# Patient Record
Sex: Female | Born: 1970 | Race: Black or African American | Hispanic: No | Smoking: Never smoker
Health system: Southern US, Community
[De-identification: ages and names within clinical notes are randomized; demographics above are authoritative.]

## PROBLEM LIST (undated history)

## (undated) DIAGNOSIS — F319 Bipolar disorder, unspecified: Secondary | ICD-10-CM

## (undated) DIAGNOSIS — F329 Major depressive disorder, single episode, unspecified: Secondary | ICD-10-CM

## (undated) DIAGNOSIS — F32A Depression, unspecified: Secondary | ICD-10-CM

## (undated) DIAGNOSIS — F419 Anxiety disorder, unspecified: Secondary | ICD-10-CM

## (undated) HISTORY — DX: Bipolar disorder, unspecified: F31.9

## (undated) HISTORY — DX: Anxiety disorder, unspecified: F41.9

## (undated) HISTORY — DX: Major depressive disorder, single episode, unspecified: F32.9

## (undated) HISTORY — DX: Depression, unspecified: F32.A

---

## 2002-12-12 ENCOUNTER — Encounter: Payer: Self-pay | Admitting: Emergency Medicine

## 2002-12-12 ENCOUNTER — Emergency Department (HOSPITAL_COMMUNITY): Admission: EM | Admit: 2002-12-12 | Discharge: 2002-12-12 | Payer: Self-pay | Admitting: Emergency Medicine

## 2005-05-20 ENCOUNTER — Ambulatory Visit (HOSPITAL_COMMUNITY): Admission: RE | Admit: 2005-05-20 | Discharge: 2005-05-20 | Payer: Self-pay | Admitting: Obstetrics and Gynecology

## 2007-07-10 ENCOUNTER — Emergency Department (HOSPITAL_COMMUNITY): Admission: EM | Admit: 2007-07-10 | Discharge: 2007-07-10 | Payer: Self-pay | Admitting: Emergency Medicine

## 2011-12-04 ENCOUNTER — Encounter (HOSPITAL_COMMUNITY): Payer: Self-pay | Admitting: *Deleted

## 2011-12-04 ENCOUNTER — Emergency Department (INDEPENDENT_AMBULATORY_CARE_PROVIDER_SITE_OTHER)
Admission: EM | Admit: 2011-12-04 | Discharge: 2011-12-04 | Disposition: A | Discharge status: Home / Self Care | Payer: BC Managed Care – PPO | Source: Home / Self Care | Attending: Family Medicine | Admitting: Family Medicine

## 2011-12-04 DIAGNOSIS — R0789 Other chest pain: Secondary | ICD-10-CM

## 2011-12-04 MED ORDER — TRAMADOL HCL 50 MG PO TABS: 50.0000 mg | ORAL_TABLET | Freq: Four times a day (QID) | ORAL | Status: AC | PRN

## 2011-12-04 NOTE — ED Notes (Signed)
Pt  Reports   Chest  Wall pain   l  Side   Worse  When  She  Takes  A  Breath   Symptoms    X  4  Days   -  She  Is  Sitting  Comfortably  On  Exam table             Skin is  Warm  /  Dry   Cap  Refill is  brisk

## 2011-12-04 NOTE — ED Provider Notes (Signed)
History     CSN: [id number]  Arrival date & time 12/04/11  1745   First MD Initiated Contact with Patient 12/04/11 1803      Chief Complaint  Patient presents with  . Chest Pain    (Consider location/radiation/quality/duration/timing/severity/associated sxs/prior treatment) HPI Comments: 41 y/o female non smoker no significant PMH here c/o chest discomfort in left side of her chest below her breast constant for 4 days worse when takes a deep breath. No associated with diaphoresis or radiation. 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 Has not had this type of pain before. Has not seen her primary doctor for over 1 year. No abdominal pain, no acid reflux. No nausea, vomitting or abdominal pain. No rash. Pain is not associated with excerption. No cough, no leg swelling. Has not taken any medications for pain. Although initially patient reported pain was constant she later stated her pain was spontaneously resolved during my examination.    History reviewed. No pertinent past medical history.  History reviewed. No pertinent past surgical history.  Family History  Problem Relation Age of Onset  . Cancer Mother     History  Substance Use Topics  . Smoking status: Not on file  . Smokeless tobacco: Not on file  . Alcohol Use:     OB History    Grav Para Term Preterm Abortions TAB SAB Ect Mult Living                  Review of Systems  Constitutional: Positive for fatigue. Negative for fever, chills and diaphoresis.  Respiratory: Negative for cough, choking, chest tightness, shortness of breath and wheezing.   Cardiovascular: Positive for chest pain. Negative for palpitations and leg swelling.  Gastrointestinal: Negative for abdominal pain.  Skin: Negative for rash.  Neurological: Negative for dizziness.  Psychiatric/Behavioral:       Feels stressed about work hours    Allergies  Review  of patient's allergies indicates no known allergies.  Home Medications   Current Outpatient Rx  Name Route Sig Dispense Refill  . TRAMADOL HCL 50 MG PO TABS Oral Take 1 tablet (50 mg total) by mouth every 6 (six) hours as needed for pain. 15 tablet 0    BP 136/74  Pulse 72  Temp(Src) 98.6 F (37 C) (Oral)  Resp 20  SpO2 100%  LMP 11/20/2011  Physical Exam  Nursing note and vitals reviewed. Constitutional: She is oriented to person, place, and time. She appears well-developed and well-nourished. No distress.  HENT:  Head: Normocephalic and atraumatic.  Mouth/Throat: Oropharynx is clear and moist.  Eyes: EOM are normal. Pupils are equal, round, and reactive to light.  Neck: Neck supple. No JVD present. No thyromegaly present.  Cardiovascular: Normal rate, regular rhythm, normal heart sounds and intact distal pulses.  Exam reveals no gallop and no friction rub.   No murmur heard. Pulmonary/Chest: Effort normal and breath sounds normal. No respiratory distress. She has no wheezes. She has no rales. She exhibits no tenderness.  Abdominal: Soft. She exhibits no distension. There is no tenderness.  Musculoskeletal: She exhibits no edema.  Lymphadenopathy:    She has no cervical adenopathy.  Neurological: She is alert and oriented to person, place, and time.  Skin: No rash noted. She is not diaphoretic.    ED Course  Procedures (including critical care time)  Labs Reviewed - No data to  display No results found.   1. Atypical chest pain       MDM  Normal EKG: NSR, rate 74. No Q waves, no ST or ischemic changes. No repolarization problems. Normal left axis. Although pain has resolved, patient requested and got a note to be out of work tomorrow so she can see her primary doctor for follow up.           Sharin Grave, MD 12/06/11 [phone]

## 2013-01-31 ENCOUNTER — Emergency Department (HOSPITAL_COMMUNITY)
Admission: EM | Admit: 2013-01-31 | Discharge: 2013-02-01 | Disposition: A | Discharge status: Home / Self Care | Payer: BC Managed Care – PPO | Attending: Emergency Medicine | Admitting: Emergency Medicine

## 2013-01-31 ENCOUNTER — Emergency Department (HOSPITAL_COMMUNITY): Payer: BC Managed Care – PPO

## 2013-01-31 ENCOUNTER — Encounter (HOSPITAL_COMMUNITY): Payer: Self-pay | Admitting: *Deleted

## 2013-01-31 DIAGNOSIS — Y929 Unspecified place or not applicable: Secondary | ICD-10-CM | POA: Insufficient documentation

## 2013-01-31 DIAGNOSIS — X58XXXA Exposure to other specified factors, initial encounter: Secondary | ICD-10-CM | POA: Insufficient documentation

## 2013-01-31 DIAGNOSIS — Y939 Activity, unspecified: Secondary | ICD-10-CM | POA: Insufficient documentation

## 2013-01-31 DIAGNOSIS — L0390 Cellulitis, unspecified: Secondary | ICD-10-CM

## 2013-01-31 DIAGNOSIS — L02619 Cutaneous abscess of unspecified foot: Secondary | ICD-10-CM | POA: Insufficient documentation

## 2013-01-31 NOTE — ED Notes (Signed)
Pt states that her middle toe of her left foot is hurting and swelling. Pt does not remember stubbing her toe, does not remember injuring her toe,but states that it has continued to hurt for the last couple of days.

## 2013-02-01 MED ORDER — CEPHALEXIN 250 MG PO CAPS
500.0000 mg | ORAL_CAPSULE | Freq: Once | ORAL | Status: AC
  Administered 2013-02-01: 500 mg via ORAL
  Filled 2013-02-01: qty 2

## 2013-02-01 MED ORDER — IBUPROFEN 600 MG PO TABS: 600.0000 mg | ORAL_TABLET | Freq: Four times a day (QID) | ORAL | Status: DC | PRN

## 2013-02-01 MED ORDER — CEPHALEXIN 500 MG PO CAPS: 500.0000 mg | ORAL_CAPSULE | Freq: Four times a day (QID) | ORAL | Status: DC

## 2013-02-01 NOTE — ED Provider Notes (Signed)
History     CSN: [id number]  Arrival date & time 01/31/13  2200   First MD Initiated Contact with Patient 02/01/13 0133      Chief Complaint  Patient presents with  . Foot Injury    (Consider location/radiation/quality/duration/timing/severity/associated sxs/prior treatment) HPI Comments: For the past 3 days has had irritation to L middle toe dorsal aspect now with increased pain and swelling, slight discoloration denies fever, trauma, new shoes   Patient is a 42 y.o. female presenting with foot injury. The history is provided by the patient.  Foot Injury Location:  Foot Time since incident:  3 days Foot location:  L foot Pain details:    Quality:  Dull   Radiates to:  Does not radiate   Severity:  Mild   Onset quality:  Gradual   Duration:  3 days   Timing:  Constant   Progression:  Worsening Chronicity:  New Dislocation: no   Foreign body present:  No foreign bodies Tetanus status:  Unknown Prior injury to area:  Yes Relieved by:  Nothing Ineffective treatments:  None tried Associated symptoms: no fever     History reviewed. No pertinent past medical history.  History reviewed. No pertinent past surgical history.  Family History  Problem Relation Age of Onset  . Cancer Mother     History  Substance Use Topics  . Smoking status: Never Smoker   . Smokeless tobacco: Not on file  . Alcohol Use: No    OB History   Grav Para Term Preterm Abortions TAB SAB Ect Mult Living                  Review of Systems  Constitutional: Negative for fever and chills.  Musculoskeletal: Positive for joint swelling. Negative for myalgias and arthralgias.  Skin: Positive for color change and wound.  All other systems reviewed and are negative.    Allergies  Review of patient's allergies indicates no known allergies.  Home Medications   Current Outpatient Rx  Name  Route  Sig  Dispense  Refill  . Multiple Vitamin (MULTIVITAMIN WITH MINERALS) TABS   Oral   Take 1  tablet by mouth daily.         . cephALEXin (KEFLEX) 500 MG capsule   Oral   Take 1 capsule (500 mg total) by mouth 4 (four) times daily.   39 capsule   0   . ibuprofen (ADVIL,MOTRIN) 600 MG tablet   Oral   Take 1 tablet (600 mg total) by mouth every 6 (six) hours as needed for pain.   30 tablet   0     BP 138/69  Pulse 80  Temp(Src) 98.6 F (37 C) (Oral)  Resp 18  SpO2 100%  LMP 01/28/2013  Physical Exam  Vitals reviewed. Constitutional: She is oriented to person, place, and time. She appears well-developed and well-nourished.  HENT:  Head: Normocephalic and atraumatic.  Eyes: Pupils are equal, round, and reactive to light.  Neck: Normal range of motion.  Cardiovascular: Normal rate.   Pulmonary/Chest: Effort normal.  Musculoskeletal: She exhibits tenderness. She exhibits no edema.       Feet:  Small punctate dark area on dorsum of L middle toe with surrounding erythema, tenderness   Neurological: She is alert and oriented to person, place, and time.  Skin: Skin is warm. There is erythema.    ED Course  Procedures (including critical care time)  Labs Reviewed - No data to display Dg Foot Complete Left  02/01/2013  *RADIOLOGY REPORT*  Clinical Data: Pain and swelling at distal metatarsals for several days, no known injury  LEFT FOOT - COMPLETE 3+ VIEW  Comparison: None  Findings: Osseous mineralization appears mildly decreased. Bone island third metatarsal head. Joint spaces preserved. No acute fracture, dislocation, or bone destruction.  IMPRESSION: No acute osseous abnormalities.   Original Report Authenticated By: Ulyses Southward, M.D.      1. Cellulitis       MDM  Outlines erythema area started on Keflex to FU with PCP or return if getting worse         Arman Filter, NP 02/01/13 0304  Arman Filter, NP 02/01/13 9562

## 2013-02-01 NOTE — ED Provider Notes (Signed)
Medical screening examination/treatment/procedure(s) were performed by non-physician practitioner and as supervising physician I was immediately available for consultation/collaboration.  Taegan Haider, MD 02/01/13 0748 

## 2013-02-01 NOTE — ED Notes (Signed)
Pt has no swelling noted to her left foot. Pt has mobility of toes and cap refill less than 3. Pt states that when she wiggles her toes it hurts. Pt pain source is below her middle toe and second to last toe.

## 2013-03-18 ENCOUNTER — Emergency Department (INDEPENDENT_AMBULATORY_CARE_PROVIDER_SITE_OTHER)
Admission: EM | Admit: 2013-03-18 | Discharge: 2013-03-18 | Disposition: A | Discharge status: Home / Self Care | Payer: BC Managed Care – PPO | Source: Home / Self Care | Attending: Family Medicine | Admitting: Family Medicine

## 2013-03-18 ENCOUNTER — Encounter (HOSPITAL_COMMUNITY): Payer: Self-pay | Admitting: *Deleted

## 2013-03-18 DIAGNOSIS — J069 Acute upper respiratory infection, unspecified: Secondary | ICD-10-CM

## 2013-03-18 MED ORDER — AZITHROMYCIN 250 MG PO TABS: ORAL_TABLET | ORAL | Status: DC

## 2013-03-18 MED ORDER — FLUTICASONE PROPIONATE 50 MCG/ACT NA SUSP: 1.0000 | Freq: Two times a day (BID) | NASAL | Status: DC

## 2013-03-18 MED ORDER — FEXOFENADINE HCL 180 MG PO TABS: 180.0000 mg | ORAL_TABLET | Freq: Every day | ORAL | Status: DC

## 2013-03-18 NOTE — ED Provider Notes (Signed)
History     CSN: [id number]  Arrival date & time 03/18/13  1729   First MD Initiated Contact with Patient 03/18/13 1743      Chief Complaint  Patient presents with  . Cough    (Consider location/radiation/quality/duration/timing/severity/associated sxs/prior treatment) Patient is a 42 y.o. female presenting with cough. The history is provided by the patient.  Cough Cough characteristics:  Non-productive, dry and hoarse Severity:  Mild Onset quality:  Gradual Duration:  1 week Progression:  Unchanged Chronicity:  New Smoker: no   Associated symptoms: rhinorrhea and sore throat   Associated symptoms: no chills, no fever, no shortness of breath and no wheezing     History reviewed. No pertinent past medical history.  History reviewed. No pertinent past surgical history.  Family History  Problem Relation Age of Onset  . Cancer Mother     History  Substance Use Topics  . Smoking status: Never Smoker   . Smokeless tobacco: Not on file  . Alcohol Use: No    OB History   Grav Para Term Preterm Abortions TAB SAB Ect Mult Living                  Review of Systems  Constitutional: Negative.  Negative for fever and chills.  HENT: Positive for congestion, sore throat, rhinorrhea and postnasal drip.   Respiratory: Positive for cough. Negative for shortness of breath and wheezing.   Gastrointestinal: Negative.     Allergies  Review of patient's allergies indicates no known allergies.  Home Medications   Current Outpatient Rx  Name  Route  Sig  Dispense  Refill  . azithromycin (ZITHROMAX Z-PAK) 250 MG tablet      Take as directed on pack   6 each   0   . cephALEXin (KEFLEX) 500 MG capsule   Oral   Take 1 capsule (500 mg total) by mouth 4 (four) times daily.   39 capsule   0   . fexofenadine (ALLEGRA) 180 MG tablet   Oral   Take 1 tablet (180 mg total) by mouth daily.   30 tablet   1   . fluticasone (FLONASE) 50 MCG/ACT nasal spray   Nasal   Place 1  spray into the nose 2 (two) times daily.   1 g   2   . ibuprofen (ADVIL,MOTRIN) 600 MG tablet   Oral   Take 1 tablet (600 mg total) by mouth every 6 (six) hours as needed for pain.   30 tablet   0   . Multiple Vitamin (MULTIVITAMIN WITH MINERALS) TABS   Oral   Take 1 tablet by mouth daily.           BP 153/87  Pulse 88  Temp(Src) 99.1 F (37.3 C) (Oral)  Resp 20  SpO2 100%  LMP 03/18/2013  Physical Exam  Nursing note and vitals reviewed. Constitutional: She is oriented to person, place, and time. She appears well-developed and well-nourished.  HENT:  Head: Normocephalic.  Right Ear: External ear normal.  Left Ear: External ear normal.  Nose: Mucosal edema and rhinorrhea present.  Mouth/Throat: Oropharynx is clear and moist.  Neck: Normal range of motion. Neck supple.  Cardiovascular: Regular rhythm.   Pulmonary/Chest: Effort normal and breath sounds normal.  Lymphadenopathy:    She has no cervical adenopathy.  Neurological: She is alert and oriented to person, place, and time.  Skin: Skin is warm and dry.    ED Course  Procedures (including critical care time)  Labs Reviewed - No data to display No results found.   1. URI (upper respiratory infection)       MDM          Linna Hoff, MD 03/18/13 [phone]

## 2013-03-18 NOTE — ED Notes (Signed)
Pt  Reports  Symptoms  Of  A   Dry  Cough    sorethroat  Hoarse        X   1  Week   She  Also  Reports       Decreased        Appetite          And  A  Headache  Last  Pm

## 2013-12-17 ENCOUNTER — Emergency Department (HOSPITAL_COMMUNITY)
Admission: EM | Admit: 2013-12-17 | Discharge: 2013-12-17 | Disposition: A | Discharge status: Home / Self Care | Payer: BC Managed Care – PPO | Source: Home / Self Care | Attending: Family Medicine | Admitting: Family Medicine

## 2013-12-17 ENCOUNTER — Encounter (HOSPITAL_COMMUNITY): Payer: Self-pay | Admitting: Emergency Medicine

## 2013-12-17 DIAGNOSIS — R05 Cough: Secondary | ICD-10-CM

## 2013-12-17 DIAGNOSIS — R059 Cough, unspecified: Secondary | ICD-10-CM

## 2013-12-17 DIAGNOSIS — J00 Acute nasopharyngitis [common cold]: Secondary | ICD-10-CM

## 2013-12-17 MED ORDER — HYDROCOD POLST-CHLORPHEN POLST 10-8 MG/5ML PO LQCR: 5.0000 mL | Freq: Two times a day (BID) | ORAL | Status: DC | PRN

## 2013-12-17 MED ORDER — AEROCHAMBER PLUS FLO-VU LARGE MISC: 1.0000 | Freq: Once | Status: DC

## 2013-12-17 MED ORDER — ALBUTEROL SULFATE HFA 108 (90 BASE) MCG/ACT IN AERS: 1.0000 | INHALATION_SPRAY | Freq: Four times a day (QID) | RESPIRATORY_TRACT | Status: DC | PRN

## 2013-12-17 MED ORDER — METHYLPREDNISOLONE (PAK) 4 MG PO TABS: ORAL_TABLET | ORAL | Status: DC

## 2013-12-17 NOTE — ED Provider Notes (Signed)
Medical screening examination/treatment/procedure(s) were performed by resident physician or non-physician practitioner and as supervising physician I was immediately available for consultation/collaboration.   KINDL,JAMES DOUGLAS MD.   James D Kindl, MD 12/17/13 1315 

## 2013-12-17 NOTE — Discharge Instructions (Signed)
Antibiotic Nonuse ° Your caregiver felt that the infection or problem was not one that would be helped with an antibiotic. °Infections may be caused by viruses or bacteria. Only a caregiver can tell which one of these is the likely cause of an illness. A cold is the most common cause of infection in both adults and children. A cold is a virus. Antibiotic treatment will have no effect on a viral infection. Viruses can lead to many lost days of work caring for sick children and many missed days of school. Children may catch as many as 10 "colds" or "flus" per year during which they can be tearful, cranky, and uncomfortable. The goal of treating a virus is aimed at keeping the ill person comfortable. °Antibiotics are medications used to help the body fight bacterial infections. There are relatively few types of bacteria that cause infections but there are hundreds of viruses. While both viruses and bacteria cause infection they are very different types of germs. A viral infection will typically go away by itself within 7 to 10 days. Bacterial infections may spread or get worse without antibiotic treatment. °Examples of bacterial infections are: °· Sore throats (like strep throat or tonsillitis). °· Infection in the lung (pneumonia). °· Ear and skin infections. °Examples of viral infections are: °· Colds or flus. °· Most coughs and bronchitis. °· Sore throats not caused by Strep. °· Runny noses. °It is often best not to take an antibiotic when a viral infection is the cause of the problem. Antibiotics can kill off the helpful bacteria that we have inside our body and allow harmful bacteria to start growing. Antibiotics can cause side effects such as allergies, nausea, and diarrhea without helping to improve the symptoms of the viral infection. Additionally, repeated uses of antibiotics can cause bacteria inside of our body to become resistant. That resistance can be passed onto harmful bacterial. The next time you have  an infection it may be harder to treat if antibiotics are used when they are not needed. Not treating with antibiotics allows our own immune system to develop and take care of infections more efficiently. Also, antibiotics will work better for us when they are prescribed for bacterial infections. °Treatments for a child that is ill may include: °· Give extra fluids throughout the day to stay hydrated. °· Get plenty of rest. °· Only give your child over-the-counter or prescription medicines for pain, discomfort, or fever as directed by your caregiver. °· The use of a cool mist humidifier may help stuffy noses. °· Cold medications if suggested by your caregiver. °Your caregiver may decide to start you on an antibiotic if: °· The problem you were seen for today continues for a longer length of time than expected. °· You develop a secondary bacterial infection. °SEEK MEDICAL CARE IF: °· Fever lasts longer than 5 days. °· Symptoms continue to get worse after 5 to 7 days or become severe. °· Difficulty in breathing develops. °· Signs of dehydration develop (poor drinking, rare urinating, dark colored urine). °· Changes in behavior or worsening tiredness (listlessness or lethargy). °Document Released: 01/20/2002 Document Revised: 02/03/2012 Document Reviewed: 07/19/2009 °[employer]® Patient Information ©2014 [employer]. ° °Upper Respiratory Infection, Adult °An upper respiratory infection (URI) is also sometimes known as the common cold. The upper respiratory tract includes the nose, sinuses, throat, trachea, and bronchi. Bronchi are the airways leading to the lungs. Most people improve within 1 week, but symptoms can last up to 2 weeks. A residual cough may   last even longer.  °CAUSES °Many different viruses can infect the tissues lining the upper respiratory tract. The tissues become irritated and inflamed and often become very moist. Mucus production is also common. A cold is contagious. You can easily spread the virus  to others by oral contact. This includes kissing, sharing a glass, coughing, or sneezing. Touching your mouth or nose and then touching a surface, which is then touched by another person, can also spread the virus. °SYMPTOMS  °Symptoms typically develop 1 to 3 days after you come in contact with a cold virus. Symptoms vary from person to person. They may include: °· Runny nose. °· Sneezing. °· Nasal congestion. °· Sinus irritation. °· Sore throat. °· Loss of voice (laryngitis). °· Cough. °· Fatigue. °· Muscle aches. °· Loss of appetite. °· Headache. °· Low-grade fever. °DIAGNOSIS  °You might diagnose your own cold based on familiar symptoms, since most people get a cold 2 to 3 times a year. Your caregiver can confirm this based on your exam. Most importantly, your caregiver can check that your symptoms are not due to another disease such as strep throat, sinusitis, pneumonia, asthma, or epiglottitis. Blood tests, throat tests, and X-rays are not necessary to diagnose a common cold, but they may sometimes be helpful in excluding other more serious diseases. Your caregiver will decide if any further tests are required. °RISKS AND COMPLICATIONS  °You may be at risk for a more severe case of the common cold if you smoke cigarettes, have chronic heart disease (such as heart failure) or lung disease (such as asthma), or if you have a weakened immune system. The very young and very old are also at risk for more serious infections. Bacterial sinusitis, middle ear infections, and bacterial pneumonia can complicate the common cold. The common cold can worsen asthma and chronic obstructive pulmonary disease (COPD). Sometimes, these complications can require emergency medical care and may be life-threatening. °PREVENTION  °The best way to protect against getting a cold is to practice good hygiene. Avoid oral or hand contact with people with cold symptoms. Wash your hands often if contact occurs. There is no clear evidence that  vitamin C, vitamin E, echinacea, or exercise reduces the chance of developing a cold. However, it is always recommended to get plenty of rest and practice good nutrition. °TREATMENT  °Treatment is directed at relieving symptoms. There is no cure. Antibiotics are not effective, because the infection is caused by a virus, not by bacteria. Treatment may include: °· Increased fluid intake. Sports drinks offer valuable electrolytes, sugars, and fluids. °· Breathing heated mist or steam (vaporizer or shower). °· Eating chicken soup or other clear broths, and maintaining good nutrition. °· Getting plenty of rest. °· Using gargles or lozenges for comfort. °· Controlling fevers with ibuprofen or acetaminophen as directed by your caregiver. °· Increasing usage of your inhaler if you have asthma. °Zinc gel and zinc lozenges, taken in the first 24 hours of the common cold, can shorten the duration and lessen the severity of symptoms. Pain medicines may help with fever, muscle aches, and throat pain. A variety of non-prescription medicines are available to treat congestion and runny nose. Your caregiver can make recommendations and may suggest nasal or lung inhalers for other symptoms.  °HOME CARE INSTRUCTIONS  °· Only take over-the-counter or prescription medicines for pain, discomfort, or fever as directed by your caregiver. °· Use a warm mist humidifier or inhale steam from a shower to increase air moisture. This may keep   secretions moist and make it easier to breathe. °· Drink enough water and fluids to keep your urine clear or pale yellow. °· Rest as needed. °· Return to work when your temperature has returned to normal or as your caregiver advises. You may need to stay home longer to avoid infecting others. You can also use a face mask and careful hand washing to prevent spread of the virus. °SEEK MEDICAL CARE IF:  °· After the first few days, you feel you are getting worse rather than better. °· You need your caregiver's  advice about medicines to control symptoms. °· You develop chills, worsening shortness of breath, or brown or red sputum. These may be signs of pneumonia. °· You develop yellow or brown nasal discharge or pain in the face, especially when you bend forward. These may be signs of sinusitis. °· You develop a fever, swollen neck glands, pain with swallowing, or white areas in the back of your throat. These may be signs of strep throat. °SEEK IMMEDIATE MEDICAL CARE IF:  °· You have a fever. °· You develop severe or persistent headache, ear pain, sinus pain, or chest pain. °· You develop wheezing, a prolonged cough, cough up blood, or have a change in your usual mucus (if you have chronic lung disease). °· You develop sore muscles or a stiff neck. °Document Released: 05/07/2001 Document Revised: 02/03/2012 Document Reviewed: 03/15/2011 °Exit[employer]® Patient Information ©2014 [employer]. ° °

## 2013-12-17 NOTE — ED Notes (Signed)
C/o  Non productive cough.  Chills.  Low grade temp.  Diarrhea.  Chest congestion.    States "just feel lousy"  Symptoms present since Monday.  No relief with otc meds.

## 2013-12-17 NOTE — ED Provider Notes (Signed)
CSN: [id number]     Arrival date & time 12/17/13  [id number] History   First MD Initiated Contact with Patient 12/17/13 1045     Chief Complaint  Patient presents with  . Influenza   (Consider location/radiation/quality/duration/timing/severity/associated sxs/prior Treatment) HPI Comments: 43 year old female presents complaining of sore throat, low-grade fever, dry cough, chest congestion. Her symptoms first started on Monday. They got worse through Wednesday and she had a temperature of 99. She is not getting any worse, she just does not feel well. She has sick contacts with viral URIs. She denies shortness of breath, pleuritic chest pain, NVD, abdominal pain. She's taking over-the-counter medications that are not helping significantly.  Patient is a 43 y.o. female presenting with flu symptoms.  Influenza Presenting symptoms: cough, fatigue and fever   Presenting symptoms: no diarrhea, no myalgias, no nausea, no shortness of breath and no vomiting   Associated symptoms: chills and ear pain     History reviewed. No pertinent past medical history. History reviewed. No pertinent past surgical history. Family History  Problem Relation Age of Onset  . Cancer Mother    History  Substance Use Topics  . Smoking status: Never Smoker   . Smokeless tobacco: Not on file  . Alcohol Use: No   OB History   Grav Para Term Preterm Abortions TAB SAB Ect Mult Living                 Review of Systems  Constitutional: Positive for fever, chills and fatigue.  HENT: Positive for ear pain.   Eyes: Negative for visual disturbance.  Respiratory: Positive for cough and chest tightness. Negative for shortness of breath.   Cardiovascular: Negative for chest pain, palpitations and leg swelling.  Gastrointestinal: Negative for nausea, vomiting, abdominal pain and diarrhea.  Endocrine: Negative for polydipsia and polyuria.  Genitourinary: Negative for dysuria, urgency and frequency.  Musculoskeletal: Negative  for arthralgias and myalgias.  Skin: Negative for rash.  Neurological: Negative for dizziness, weakness and light-headedness.    Allergies  Review of patient's allergies indicates no known allergies.  Home Medications   Current Outpatient Rx  Name  Route  Sig  Dispense  Refill  . Multiple Vitamin (MULTIVITAMIN WITH MINERALS) TABS   Oral   Take 1 tablet by mouth daily.         Marland Kitchen. albuterol (PROVENTIL HFA;VENTOLIN HFA) 108 (90 BASE) MCG/ACT inhaler   Inhalation   Inhale 1-2 puffs into the lungs every 6 (six) hours as needed for wheezing or shortness of breath.   1 Inhaler   0   . azithromycin (ZITHROMAX Z-PAK) 250 MG tablet      Take as directed on pack   6 each   0   . cephALEXin (KEFLEX) 500 MG capsule   Oral   Take 1 capsule (500 mg total) by mouth 4 (four) times daily.   39 capsule   0   . chlorpheniramine-HYDROcodone (TUSSIONEX PENNKINETIC ER) 10-8 MG/5ML LQCR   Oral   Take 5 mLs by mouth every 12 (twelve) hours as needed for cough.   115 mL   0   . fexofenadine (ALLEGRA) 180 MG tablet   Oral   Take 1 tablet (180 mg total) by mouth daily.   30 tablet   1   . fluticasone (FLONASE) 50 MCG/ACT nasal spray   Nasal   Place 1 spray into the nose 2 (two) times daily.   1 g   2   . ibuprofen (ADVIL,MOTRIN) 600 MG  tablet   Oral   Take 1 tablet (600 mg total) by mouth every 6 (six) hours as needed for pain.   30 tablet   0   . methylPREDNIsolone (MEDROL DOSPACK) 4 MG tablet      follow package directions   21 tablet   0   . Spacer/Aero-Holding Chambers (AEROCHAMBER PLUS FLO-VU LARGE) MISC   Other   1 each by Other route once.   1 each   0    BP 149/74  Pulse 78  Temp(Src) 98.4 F (36.9 C) (Oral)  Resp 20  SpO2 100%  LMP 12/10/2013 Physical Exam  Nursing note and vitals reviewed. Constitutional: She is oriented to person, place, and time. Vital signs are normal. She appears well-developed and well-nourished. No distress.  HENT:  Head:  Normocephalic and atraumatic.  Right Ear: External ear normal.  Left Ear: External ear normal.  Nose: Nose normal.  Mouth/Throat: Oropharynx is clear and moist. No oropharyngeal exudate.  Eyes: Conjunctivae are normal. Right eye exhibits no discharge. Left eye exhibits no discharge.  Neck: Normal range of motion. Neck supple.  Cardiovascular: Normal rate, regular rhythm and normal heart sounds.  Exam reveals no gallop and no friction rub.   No murmur heard. Pulmonary/Chest: Effort normal and breath sounds normal. No respiratory distress. She has no wheezes. She has no rales.  Lymphadenopathy:    She has no cervical adenopathy.  Neurological: She is alert and oriented to person, place, and time. She has normal strength. Coordination normal.  Skin: Skin is warm and dry. No rash noted. She is not diaphoretic.  Psychiatric: She has a normal mood and affect. Judgment normal.    ED Course  Procedures (including critical care time) Labs Review Labs Reviewed - No data to display Imaging Review No results found.    MDM   1. Acute nasopharyngitis (common cold)   2. Cough    Physical exam is normal. This is a cold. Treating symptomatically. Followup when necessary   Meds ordered this encounter  Medications  . chlorpheniramine-HYDROcodone (TUSSIONEX PENNKINETIC ER) 10-8 MG/5ML LQCR    Sig: Take 5 mLs by mouth every 12 (twelve) hours as needed for cough.    Dispense:  115 mL    Refill:  0    Order Specific Question:  Supervising Provider    Answer:  Linna Hoff [phone]  . methylPREDNIsolone (MEDROL DOSPACK) 4 MG tablet    Sig: follow package directions    Dispense:  21 tablet    Refill:  0    Order Specific Question:  Supervising Provider    Answer:  Linna Hoff [phone]  . albuterol (PROVENTIL HFA;VENTOLIN HFA) 108 (90 BASE) MCG/ACT inhaler    Sig: Inhale 1-2 puffs into the lungs every 6 (six) hours as needed for wheezing or shortness of breath.    Dispense:  1 Inhaler     Refill:  0    Order Specific Question:  Supervising Provider    Answer:  Bradd Canary D [id number]  . Spacer/Aero-Holding Chambers (AEROCHAMBER PLUS FLO-VU LARGE) MISC    Sig: 1 each by Other route once.    Dispense:  1 each    Refill:  0    Order Specific Question:  Supervising Provider    Answer:  Bradd Canary D [[id number]]       Graylon Good, PA-C 12/17/13 1112

## 2014-01-25 ENCOUNTER — Encounter (HOSPITAL_COMMUNITY): Payer: Self-pay | Admitting: Emergency Medicine

## 2014-01-25 ENCOUNTER — Emergency Department (HOSPITAL_COMMUNITY)
Admission: EM | Admit: 2014-01-25 | Discharge: 2014-01-25 | Disposition: A | Discharge status: Home / Self Care | Payer: BC Managed Care – PPO | Attending: Emergency Medicine | Admitting: Emergency Medicine

## 2014-01-25 ENCOUNTER — Emergency Department (HOSPITAL_COMMUNITY): Payer: BC Managed Care – PPO

## 2014-01-25 DIAGNOSIS — J069 Acute upper respiratory infection, unspecified: Secondary | ICD-10-CM | POA: Insufficient documentation

## 2014-01-25 DIAGNOSIS — R000 Tachycardia, unspecified: Secondary | ICD-10-CM | POA: Insufficient documentation

## 2014-01-25 MED ORDER — AZITHROMYCIN 250 MG PO TABS: 250.0000 mg | ORAL_TABLET | Freq: Every day | ORAL | Status: DC

## 2014-01-25 MED ORDER — PREDNISONE 20 MG PO TABS
60.0000 mg | ORAL_TABLET | Freq: Once | ORAL | Status: AC
  Administered 2014-01-25: 60 mg via ORAL
  Filled 2014-01-25: qty 3

## 2014-01-25 MED ORDER — ALBUTEROL SULFATE HFA 108 (90 BASE) MCG/ACT IN AERS
2.0000 | INHALATION_SPRAY | RESPIRATORY_TRACT | Status: DC | PRN
  Administered 2014-01-25: 2 via RESPIRATORY_TRACT
  Filled 2014-01-25: qty 6.7

## 2014-01-25 MED ORDER — IPRATROPIUM BROMIDE 0.02 % IN SOLN
0.5000 mg | Freq: Once | RESPIRATORY_TRACT | Status: AC
  Administered 2014-01-25: 0.5 mg via RESPIRATORY_TRACT
  Filled 2014-01-25: qty 2.5

## 2014-01-25 MED ORDER — ALBUTEROL SULFATE (2.5 MG/3ML) 0.083% IN NEBU
5.0000 mg | INHALATION_SOLUTION | Freq: Once | RESPIRATORY_TRACT | Status: AC
  Administered 2014-01-25: 5 mg via RESPIRATORY_TRACT
  Filled 2014-01-25: qty 6

## 2014-01-25 NOTE — ED Notes (Signed)
Cold symptoms started last week-- was seen, given tussinex, got better, but over the weekend started coughing, hoarse voice, barky sounding cough

## 2014-01-25 NOTE — ED Provider Notes (Signed)
Medical screening examination/treatment/procedure(s) were performed by non-physician practitioner and as supervising physician I was immediately available for consultation/collaboration.  Kiyonna Tortorelli L Xitlaly Ault, MD 01/25/14 2032 

## 2014-01-25 NOTE — Discharge Instructions (Signed)

## 2014-01-25 NOTE — ED Notes (Signed)
Pt comfortable with discharge and follow up instructions. Prescriptions x1 

## 2014-01-25 NOTE — ED Provider Notes (Signed)
CSN: [id number]     Arrival date & time 01/25/14  0900 History   First MD Initiated Contact with Patient 01/25/14 1118     Chief Complaint  Patient presents with  . URI     (Consider location/radiation/quality/duration/timing/severity/associated sxs/prior Treatment) HPI  Kelly Beasley is a 43 y.o.female without any significant PMH presents to the ER with complaints of coughing for months off and on but worse this past couple of days. Now her throat hurts mildly from all the coughing. She has been trying OTC medications but it doesn't help. She is not on Lisinopril.  She has no had any antibiotics and no chest xray. NO fevers, non productive, no hx of asthma, no n/v. No CP,  Denies urinary or bowel symptoms.   History reviewed. No pertinent past medical history. History reviewed. No pertinent past surgical history. Family History  Problem Relation Age of Onset  . Cancer Mother    History  Substance Use Topics  . Smoking status: Never Smoker   . Smokeless tobacco: Not on file  . Alcohol Use: No   OB History   Grav Para Term Preterm Abortions TAB SAB Ect Mult Living                 Review of Systems  The patient denies anorexia, fever, weight loss,, vision loss, decreased hearing, hoarseness, chest pain, syncope, dyspnea on exertion, peripheral edema, balance deficits, hemoptysis, abdominal pain, melena, hematochezia, severe indigestion/heartburn, hematuria, incontinence, genital sores, muscle weakness, suspicious skin lesions, transient blindness, difficulty walking, depression, unusual weight change, abnormal bleeding, enlarged lymph nodes, angioedema, and breast masses.   Allergies  Review of patient's allergies indicates no known allergies.  Home Medications   Current Outpatient Rx  Name  Route  Sig  Dispense  Refill  . albuterol (PROVENTIL HFA;VENTOLIN HFA) 108 (90 BASE) MCG/ACT inhaler   Inhalation   Inhale 1-2 puffs into the lungs every 6 (six) hours as needed for  wheezing or shortness of breath.   1 Inhaler   0   . guaiFENesin (ROBITUSSIN) 100 MG/5ML liquid   Oral   Take 300 mg by mouth 3 (three) times daily as needed for cough.         . Multiple Vitamin (MULTIVITAMIN WITH MINERALS) TABS   Oral   Take 1 tablet by mouth daily.         Marland Kitchen. OVER THE COUNTER MEDICATION   Oral   Take 1 capsule by mouth at bedtime as needed (congestion).         Marland Kitchen. azithromycin (ZITHROMAX) 250 MG tablet   Oral   Take 1 tablet (250 mg total) by mouth daily. Take first 2 tablets together, then 1 every day until finished.   6 tablet   0    BP 128/69  Pulse 92  Temp(Src) 99 F (37.2 C) (Oral)  Resp 18  Ht 5\' 6"  (1.676 m)  Wt 151 lb (68.493 kg)  BMI 24.38 kg/m2  SpO2 99%  LMP 01/11/2014 Physical Exam  Nursing note and vitals reviewed. Constitutional: She appears well-developed and well-nourished. No distress.  HENT:  Head: Normocephalic and atraumatic.  Right Ear: External ear normal.  Left Ear: External ear normal.  Nose: Nose normal.  Mouth/Throat: Uvula is midline, oropharynx is clear and moist and mucous membranes are normal. No oropharyngeal exudate.  No beefy red tonsils  Eyes: Conjunctivae are normal. Pupils are equal, round, and reactive to light.  Neck: Normal range of motion. Neck supple.  Cardiovascular:  Regular rhythm.  Tachycardia present.   Pulmonary/Chest: Effort normal. Not bradypneic. She has decreased breath sounds. She has no wheezes. She has no rales.  Abdominal: Soft. There is no tenderness. There is no rebound.  Neurological: She is alert.  Skin: Skin is warm and dry.    ED Course  Procedures (including critical care time) Labs Review Labs Reviewed - No data to display Imaging Review Dg Chest 2 View  01/25/2014   CLINICAL DATA:  Cough, congestion, chest pain and left-sided back pain  EXAM: CHEST  2 VIEW  COMPARISON:  None.  FINDINGS: The heart size and mediastinal contours are within normal limits. Both lungs are clear.  The visualized skeletal structures are unremarkable.  IMPRESSION: No active cardiopulmonary disease.   Electronically Signed   By: Esperanza Heir M.D.   On: 01/25/2014 12:36     EKG Interpretation None      MDM   Final diagnoses:  URI (upper respiratory infection)    Patients xray is normal. Breathing treatment helped. Good airway movement at dx. Will give albuterol HFA and Azithromycin. No wheezing. F/u with PCP.  43 y.o.Kelly Beasley's evaluation in the Emergency Department is complete. It has been determined that no acute conditions requiring further emergency intervention are present at this time. The patient/guardian have been advised of the diagnosis and plan. We have discussed signs and symptoms that warrant return to the ED, such as changes or worsening in symptoms.  Vital signs are stable at discharge. Filed Vitals:   01/25/14 1315  BP: 128/69  Pulse: 92  Temp: 99 F (37.2 C)  Resp: 18    Patient/guardian has voiced understanding and agreed to follow-up with the PCP or specialist.     Dorthula Matas, PA-C 01/25/14 1913

## 2016-02-01 ENCOUNTER — Emergency Department (INDEPENDENT_AMBULATORY_CARE_PROVIDER_SITE_OTHER)
Admission: EM | Admit: 2016-02-01 | Discharge: 2016-02-01 | Disposition: A | Discharge status: Home / Self Care | Payer: BLUE CROSS/BLUE SHIELD | Source: Home / Self Care | Attending: Emergency Medicine | Admitting: Emergency Medicine

## 2016-02-01 ENCOUNTER — Encounter (HOSPITAL_COMMUNITY): Payer: Self-pay | Admitting: *Deleted

## 2016-02-01 ENCOUNTER — Other Ambulatory Visit (HOSPITAL_COMMUNITY)
Admission: RE | Admit: 2016-02-01 | Discharge: 2016-02-01 | Disposition: A | Discharge status: Home / Self Care | Payer: BLUE CROSS/BLUE SHIELD | Source: Ambulatory Visit | Attending: Emergency Medicine | Admitting: Emergency Medicine

## 2016-02-01 DIAGNOSIS — J069 Acute upper respiratory infection, unspecified: Secondary | ICD-10-CM | POA: Insufficient documentation

## 2016-02-01 DIAGNOSIS — H9202 Otalgia, left ear: Secondary | ICD-10-CM

## 2016-02-01 DIAGNOSIS — J040 Acute laryngitis: Secondary | ICD-10-CM

## 2016-02-01 LAB — POCT RAPID STREP A: STREPTOCOCCUS, GROUP A SCREEN (DIRECT): NEGATIVE

## 2016-02-01 MED ORDER — AMOXICILLIN 250 MG PO CAPS: 250.0000 mg | ORAL_CAPSULE | Freq: Two times a day (BID) | ORAL | Status: DC

## 2016-02-01 NOTE — Discharge Instructions (Signed)
Cough, Adult Coughing is a reflex that clears your throat and your airways. Coughing helps to heal and protect your lungs. It is normal to cough occasionally, but a cough that happens with other symptoms or lasts a long time may be a sign of a condition that needs treatment. A cough may last only 2-3 weeks (acute), or it may last longer than 8 weeks (chronic). CAUSES Coughing is commonly caused by:  Breathing in substances that irritate your lungs.  A viral or bacterial respiratory infection.  Allergies.  Asthma.  Postnasal drip.  Smoking.  Acid backing up from the stomach into the esophagus (gastroesophageal reflux).  Certain medicines.  Chronic lung problems, including COPD (or rarely, lung cancer).  Other medical conditions such as heart failure. HOME CARE INSTRUCTIONS  Pay attention to any changes in your symptoms. Take these actions to help with your discomfort:  Take medicines only as told by your health care provider.  If you were prescribed an antibiotic medicine, take it as told by your health care provider. Do not stop taking the antibiotic even if you start to feel better.  Talk with your health care provider before you take a cough suppressant medicine.  Drink enough fluid to keep your urine clear or pale yellow.  If the air is dry, use a cold steam vaporizer or humidifier in your bedroom or your home to help loosen secretions.  Avoid anything that causes you to cough at work or at home.  If your cough is worse at night, try sleeping in a semi-upright position.  Avoid cigarette smoke. If you smoke, quit smoking. If you need help quitting, ask your health care provider.  Avoid caffeine.  Avoid alcohol.  Rest as needed. SEEK MEDICAL CARE IF:   You have new symptoms.  You cough up pus.  Your cough does not get better after 2-3 weeks, or your cough gets worse.  You cannot control your cough with suppressant medicines and you are losing sleep.  You  develop pain that is getting worse or pain that is not controlled with pain medicines.  You have a fever.  You have unexplained weight loss.  You have night sweats. SEEK IMMEDIATE MEDICAL CARE IF:  You cough up blood.  You have difficulty breathing.  Your heartbeat is very fast.   This information is not intended to replace advice given to you by your health care provider. Make sure you discuss any questions you have with your health care provider.   Document Released: 05/10/2011 Document Revised: 08/02/2015 Document Reviewed: 01/18/2015 Elsevier Interactive Patient Education 2016 Else[employer] Earache An earache, also called otalgia, can be caused by many things. Pain from an earache can be sharp, dull, or burning. The pain may be temporary or constant. Earaches can be caused by problems with the ear, such as infection in either the middle ear or the ear canal, injury, impacted ear wax, middle ear pressure, or a foreign body in the ear. Ear pain can also result from problems in other areas. This is called referred pain. For example, pain can come from a sore throat, a tooth infection, or problems with the jaw or the joint between the jaw and the skull (temporomandibular joint, or TMJ). The cause of an earache is not always easy to identify. Watchful waiting may be appropriate for some earaches until a clear cause of the pain can be found. HOME CARE INSTRUCTIONS Watch your condition for any changes. The following actions may help to lessen any discomfort  that you are feeling:  Take medicines only as directed by your health care provider. This includes ear drops.  Apply ice to your outer ear to help reduce pain.  Put ice in a plastic bag.  Place a towel between your skin and the bag.  Leave the ice on for 20 minutes, 2-3 times per day.  Do not put anything in your ear other than medicine that is prescribed by your health care provider.  Try resting in an upright position instead  of lying down. This may help to reduce pressure in the middle ear and relieve pain.  Chew gum if it helps to relieve your ear pain.  Control any allergies that you have.  Keep all follow-up visits as directed by your health care provider. This is important. SEEK MEDICAL CARE IF:  Your pain does not improve within 2 days.  You have a fever.  You have new or worsening symptoms. SEEK IMMEDIATE MEDICAL CARE IF:  You have a severe headache.  You have a stiff neck.  You have difficulty swallowing.  You have redness or swelling behind your ear.  You have drainage from your ear.  You have hearing loss.  You feel dizzy.   This information is not intended to replace advice given to you by your health care provider. Make sure you discuss any questions you have with your health care provider.   Document Released: 06/28/2004 Document Revised: 12/02/2014 Document Reviewed: 06/12/2014 Elsevier Interactive Patient Education 2016 Else[employer] Inc. Laryngitis Laryngitis is swelling (inflammation) of your vocal cords. This causes hoarseness, coughing, loss of voice, sore throat, or a dry throat. When your vocal cords are inflamed, your voice sounds different. Laryngitis can be temporary (acute) or long-term (chronic). Most cases of acute laryngitis improve with time. Chronic laryngitis is laryngitis that lasts for more than three weeks. HOME CARE  Drink enough fluid to keep your pee (urine) clear or pale yellow.  Breathe in moist air. Use a humidifier if you live in a dry climate.  Take medicines only as told by your doctor.  Do not smoke cigarettes or electronic cigarettes. If you need help quitting, ask your doctor.  Talk as little as possible. Also avoid whispering, which can cause vocal strain.  Write instead of talking. Do this until your voice is back to normal. GET HELP IF:  You have a fever.  Your pain is worse.  You have trouble swallowing. GET HELP RIGHT AWAY IF:  You  cough up blood.  You have trouble breathing.   This information is not intended to replace advice given to you by your health care provider. Make sure you discuss any questions you have with your health care provider.   Document Released: 10/31/2011 Document Revised: 12/02/2014 Document Reviewed: 04/26/2014 Elsevier Interactive Patient Education [employer]

## 2016-02-01 NOTE — ED Provider Notes (Signed)
CSN: [id number]     Arrival date & time 02/01/16  1304 History   First MD Initiated Contact with Patient 02/01/16 1340     Chief Complaint  Patient presents with  . Sore Throat   (Consider location/radiation/quality/duration/timing/severity/associated sxs/prior Treatment) HPI History obtained from patient:  Pt presents with the cc of: earache, sore throat, loss of voice Duration of symptoms:3 days Treatment prior to arrival:OTC medications Previous symptoms of this type before:yes Pain score:2 Other symptoms include:runny nose, low grade temp  History reviewed. No pertinent past medical history. History reviewed. No pertinent past surgical history. Family History  Problem Relation Age of Onset  . Cancer Mother    Social History  Substance Use Topics  . Smoking status: Never Smoker   . Smokeless tobacco: None  . Alcohol Use: No   OB History    No data available     Review of Systems Sore throat, ear ache, loss of voice Allergies  Review of patient's allergies indicates no known allergies.  Home Medications   Prior to Admission medications   Medication Sig Start Date End Date Taking? Authorizing Provider  albuterol (PROVENTIL HFA;VENTOLIN HFA) 108 (90 BASE) MCG/ACT inhaler Inhale 1-2 puffs into the lungs every 6 (six) hours as needed for wheezing or shortness of breath. 12/17/13   Graylon Good, PA-C  azithromycin (ZITHROMAX) 250 MG tablet Take 1 tablet (250 mg total) by mouth daily. Take first 2 tablets together, then 1 every day until finished. 01/25/14   Tiffany Neva Seat, PA-C  guaiFENesin (ROBITUSSIN) 100 MG/5ML liquid Take 300 mg by mouth 3 (three) times daily as needed for cough.    Historical Provider, MD  Multiple Vitamin (MULTIVITAMIN WITH MINERALS) TABS Take 1 tablet by mouth daily.    Historical Provider, MD  OVER THE COUNTER MEDICATION Take 1 capsule by mouth at bedtime as needed (congestion).    Historical Provider, MD   Meds Ordered and Administered this Visit   Medications - No data to display  BP 155/99 mmHg  Pulse 93  Temp(Src) 98.5 F (36.9 C) (Oral)  Resp 16  SpO2 98%  LMP 01/10/2016 No data found.   Physical Exam NURSES NOTES AND VITAL SIGNS REVIEWED. CONSTITUTIONAL: Well developed, well nourished, no acute distress HEENT: normocephalic, atraumatic, right and left TM's are normal EYES: Conjunctiva normal NECK:normal ROM, supple, no adenopathy PULMONARY:No respiratory distress, normal effort, Lungs: CTAb/l, no wheezes, or increased work of breathing CARDIOVASCULAR: RRR, no murmur ABDOMEN: soft, ND, NT, +'ve BS MUSCULOSKELETAL: Normal ROM of all extremities,  SKIN: warm and dry without rash PSYCHIATRIC: Mood and affect, behavior are normal  ED Course  Procedures (including critical care time)  Labs Review Labs Reviewed  POCT RAPID STREP A    Imaging Review No results found.   Visual Acuity Review  Right Eye Distance:   Left Eye Distance:   Bilateral Distance:    Right Eye Near:   Left Eye Near:    Bilateral Near:       RBS: negative RX amoxil   MDM   1. URI (upper respiratory infection)   2. Laryngitis   3. Otalgia of left ear    Patient is reassured that there are no issues that require transfer to higher level of care at this time.  Patient is advised to continue home symptomatic treatment. Prescription is sent to  pharmacy patient has indicated.  Patient is advised that if there are new or worsening symptoms or attend the emergency department, or contact primary care provider.  Instructions of care provided discharged home in stable condition. Return to work/school note provided.  THIS NOTE WAS GENERATED USING A VOICE RECOGNITION SOFTWARE PROGRAM. ALL REASONABLE EFFORTS  WERE MADE TO PROOFREAD THIS DOCUMENT FOR ACCURACY.     Tharon AquasFrank C Christen Bedoya, PA 02/01/16 1419

## 2016-02-01 NOTE — ED Notes (Signed)
Pt  Reports     l  Earache     Hoarse  With fever    Also  Reports  A  sorethroat     And  Cough  Symptoms x  4  Days      Pt  Reports   Symptoms  Not  releived  By OTC meds      Pt  Sitting upright on the  Exam table  Speaking  In  Complete   sentances

## 2016-02-04 LAB — CULTURE, GROUP A STREP (THRC)

## 2016-02-11 IMAGING — CR DG CHEST 2V
2 series · 2 of 2 positions shown · non-contrast
Comparison: None.

CLINICAL DATA: Cough, congestion, chest pain and left-sided back
pain

EXAM:
CHEST  2 VIEW

[w chest pa]
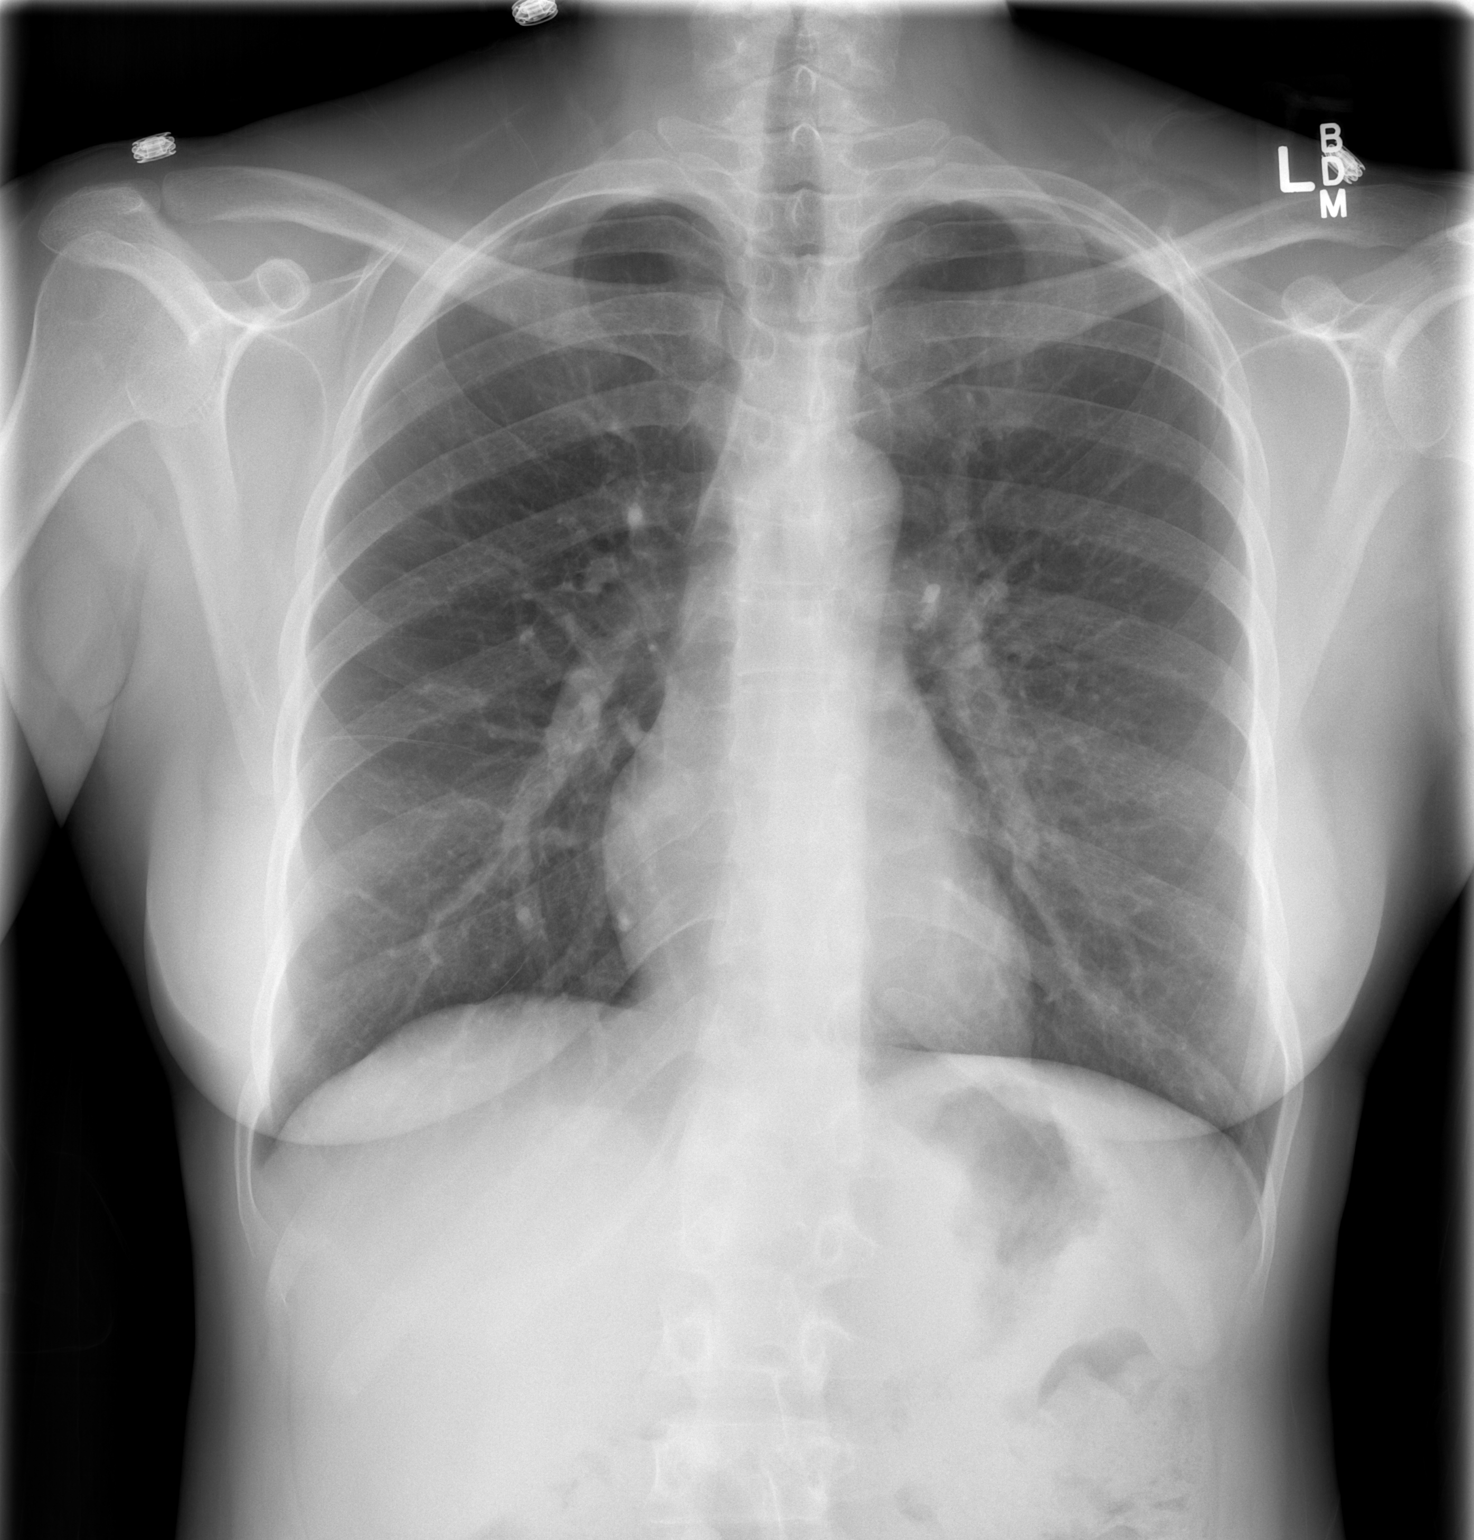

[w chest lat]
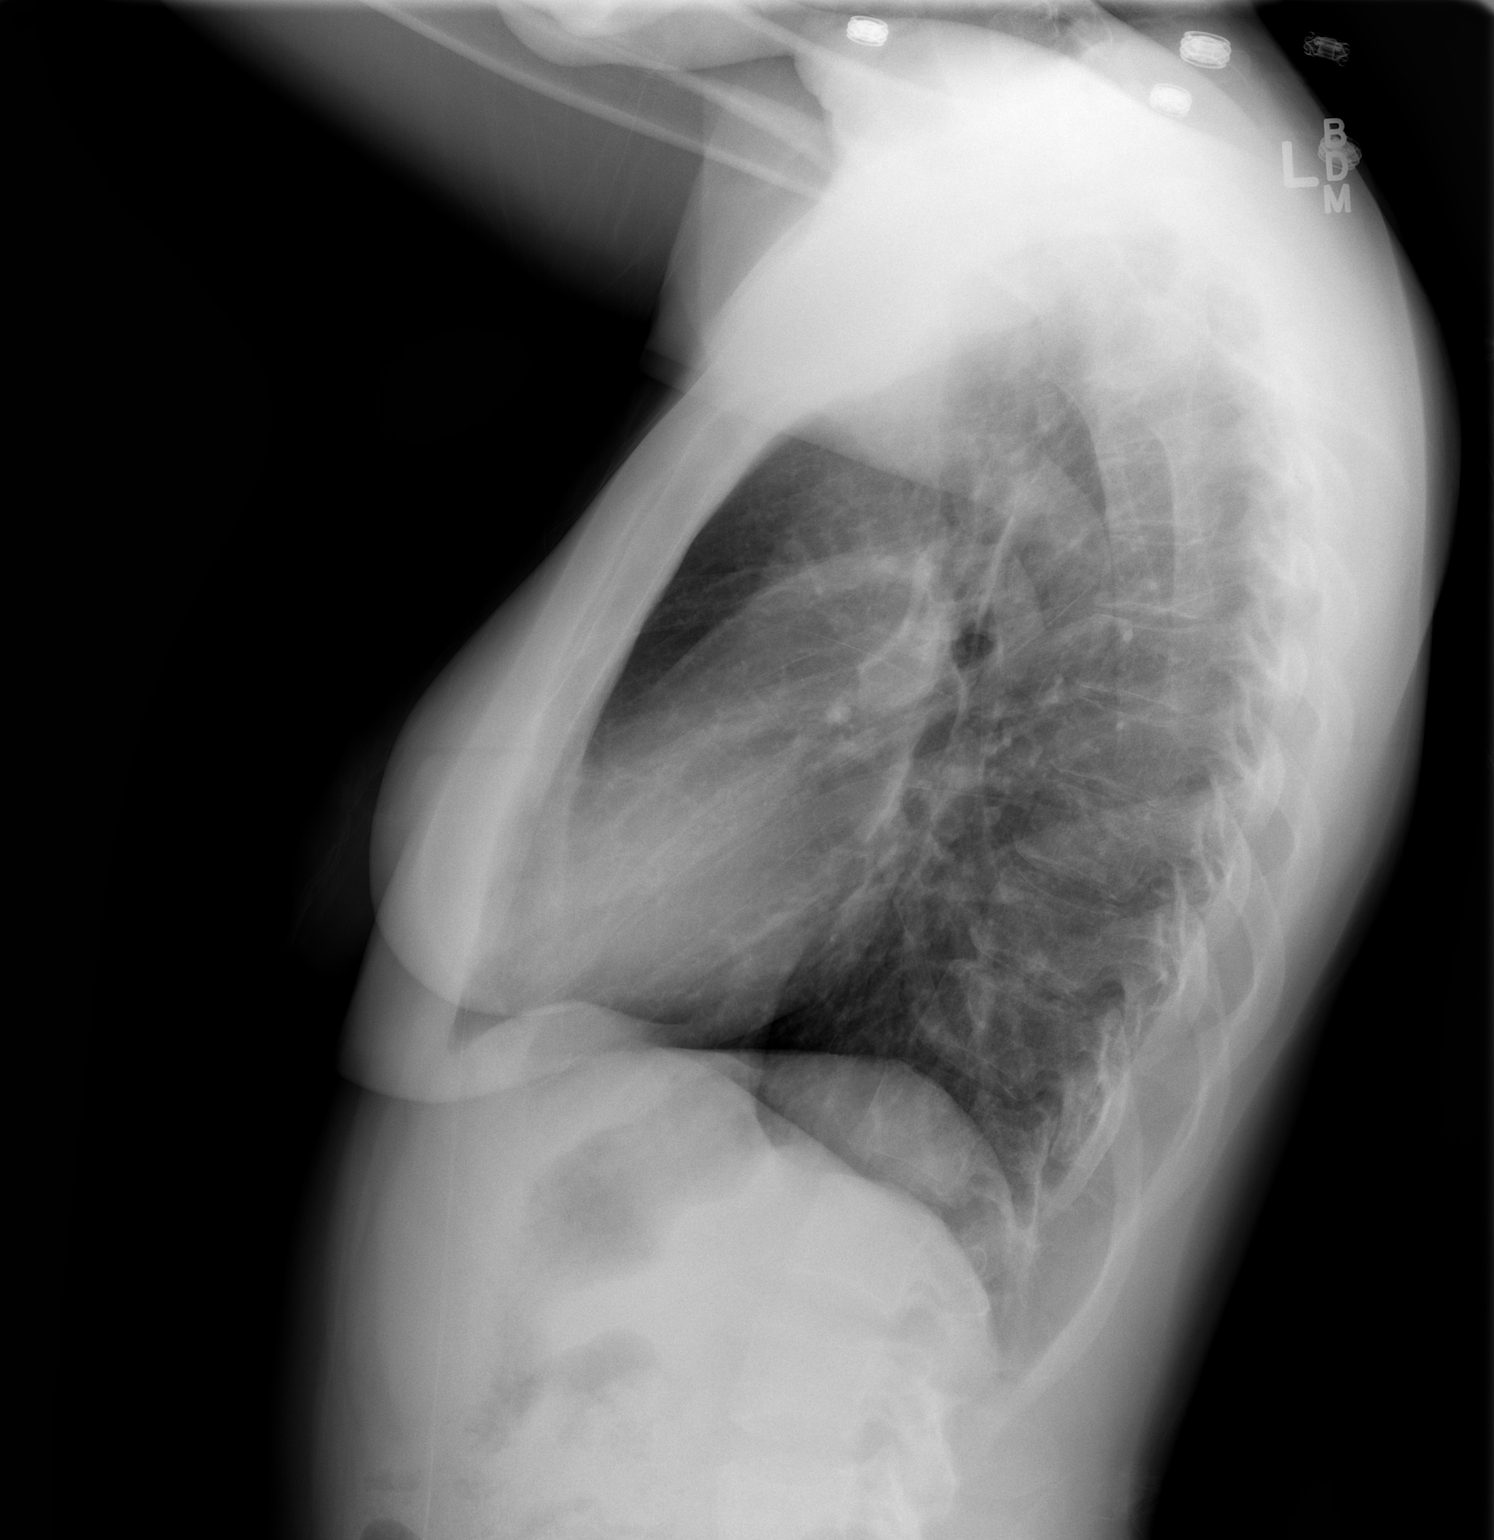

[2 of 2 positions shown; findings below may reference images not displayed]

FINDINGS: The heart size and mediastinal contours are within normal limits.
Both lungs are clear. The visualized skeletal structures are
unremarkable.
IMPRESSION: No active cardiopulmonary disease.

## 2016-08-30 ENCOUNTER — Ambulatory Visit (HOSPITAL_COMMUNITY)
Admission: EM | Admit: 2016-08-30 | Discharge: 2016-08-30 | Disposition: A | Discharge status: Home / Self Care | Payer: BLUE CROSS/BLUE SHIELD | Attending: Family Medicine | Admitting: Family Medicine

## 2016-08-30 ENCOUNTER — Encounter (HOSPITAL_COMMUNITY): Payer: Self-pay | Admitting: Emergency Medicine

## 2016-08-30 DIAGNOSIS — J029 Acute pharyngitis, unspecified: Secondary | ICD-10-CM | POA: Insufficient documentation

## 2016-08-30 DIAGNOSIS — J060 Acute laryngopharyngitis: Secondary | ICD-10-CM

## 2016-08-30 MED ORDER — AMOXICILLIN 500 MG PO CAPS: 1000.0000 mg | ORAL_CAPSULE | Freq: Two times a day (BID) | ORAL | 0 refills | Status: AC

## 2016-08-30 NOTE — ED Provider Notes (Signed)
CSN: [id number]     Arrival date & time 08/30/16  1159 History   None    Chief Complaint  Patient presents with  . Sore Throat   (Consider location/radiation/quality/duration/timing/severity/associated sxs/prior Treatment) HPI History obtained from patient:   LOCATION:throat SEVERITY:6 DURATION:1 day CONTEXT:sudden onset QUALITY:scratchy, hoarse, loss of voice.  MODIFYING FACTORS:OTC meds without relief ASSOCIATED SYMPTOMS:hurts to swallow  TIMING:now constant  History reviewed. No pertinent past medical history. History reviewed. No pertinent surgical history. Family History  Problem Relation Age of Onset  . Cancer Mother    Social History  Substance Use Topics  . Smoking status: Never Smoker  . Smokeless tobacco: Never Used  . Alcohol use No   OB History    No data available     Review of Systems  Denies: HEADACHE, NAUSEA, ABDOMINAL PAIN, CHEST PAIN, CONGESTION, DYSURIA, SHORTNESS OF BREATH  Allergies  Review of patient's allergies indicates no known allergies.  Home Medications   Prior to Admission medications   Medication Sig Start Date End Date Taking? Authorizing Provider  albuterol (PROVENTIL HFA;VENTOLIN HFA) 108 (90 BASE) MCG/ACT inhaler Inhale 1-2 puffs into the lungs every 6 (six) hours as needed for wheezing or shortness of breath. 12/17/13   Graylon Good, PA-C  amoxicillin (AMOXIL) 250 MG capsule Take 1 capsule (250 mg total) by mouth 2 (two) times daily. 02/01/16   Tharon Aquas, PA  amoxicillin (AMOXIL) 500 MG capsule Take 2 capsules (1,000 mg total) by mouth 2 (two) times daily. 08/30/16 09/06/16  Tharon Aquas, PA  azithromycin (ZITHROMAX) 250 MG tablet Take 1 tablet (250 mg total) by mouth daily. Take first 2 tablets together, then 1 every day until finished. 01/25/14   Tiffany Neva Seat, PA-C  guaiFENesin (ROBITUSSIN) 100 MG/5ML liquid Take 300 mg by mouth 3 (three) times daily as needed for cough.    Historical Provider, MD  Multiple Vitamin  (MULTIVITAMIN WITH MINERALS) TABS Take 1 tablet by mouth daily.    Historical Provider, MD  OVER THE COUNTER MEDICATION Take 1 capsule by mouth at bedtime as needed (congestion).    Historical Provider, MD   Meds Ordered and Administered this Visit  Medications - No data to display  BP 124/70 (BP Location: Left Arm)   Pulse 83   Temp 97.8 F (36.6 C) (Oral)   Resp 14   SpO2 100%  No data found.   Physical Exam NURSES NOTES AND VITAL SIGNS REVIEWED. CONSTITUTIONAL: Well developed, well nourished, no acute distress HEENT: normocephalic, atraumatic, Throat is mildly injected. Without exudate.  EYES: Conjunctiva normal NECK:normal ROM, supple, no adenopathy PULMONARY:No respiratory distress, normal effort ABDOMINAL: Soft, ND, NT BS+, No CVAT MUSCULOSKELETAL: Normal ROM of all extremities,  SKIN: warm and dry without rash PSYCHIATRIC: Mood and affect, behavior are normal  Urgent Care Course   Clinical Course    Procedures (including critical care time)  Labs Review Labs Reviewed - No data to display  Imaging Review No results found.   Visual Acuity Review  Right Eye Distance:   Left Eye Distance:   Bilateral Distance:    Right Eye Near:   Left Eye Near:    Bilateral Near:         MDM   1. Acute laryngopharyngitis     Patient is reassured that there are no issues that require transfer to higher level of care at this time or additional tests. Patient is advised to continue home symptomatic treatment. Patient is advised that if there are new or  worsening symptoms to attend the emergency department, contact primary care provider, or return to UC. Instructions of care provided discharged home in stable condition.    THIS NOTE WAS GENERATED USING A VOICE RECOGNITION SOFTWARE PROGRAM. ALL REASONABLE EFFORTS  WERE MADE TO PROOFREAD THIS DOCUMENT FOR ACCURACY.  I have verbally reviewed the discharge instructions with the patient. A printed AVS was given to the  patient.  All questions were answered prior to discharge.      Tharon AquasFrank C Patrick, PA 08/30/16 [phone]

## 2016-08-30 NOTE — ED Triage Notes (Signed)
[facility] a [facility], sore throat, fever and chills x 4 days. The patient also reported left ear pain.

## 2016-09-02 LAB — CULTURE, GROUP A STREP (THRC)

## 2016-09-02 LAB — POCT RAPID STREP A: STREPTOCOCCUS, GROUP A SCREEN (DIRECT): NEGATIVE

## 2016-09-16 ENCOUNTER — Ambulatory Visit (INDEPENDENT_AMBULATORY_CARE_PROVIDER_SITE_OTHER): Payer: BLUE CROSS/BLUE SHIELD | Admitting: Otolaryngology

## 2016-09-16 DIAGNOSIS — R490 Dysphonia: Secondary | ICD-10-CM

## 2016-09-16 DIAGNOSIS — J382 Nodules of vocal cords: Secondary | ICD-10-CM

## 2017-01-03 ENCOUNTER — Ambulatory Visit (HOSPITAL_COMMUNITY)
Admission: EM | Admit: 2017-01-03 | Discharge: 2017-01-03 | Disposition: A | Discharge status: Home / Self Care | Payer: BLUE CROSS/BLUE SHIELD | Attending: Family Medicine | Admitting: Family Medicine

## 2017-01-03 ENCOUNTER — Encounter (HOSPITAL_COMMUNITY): Payer: Self-pay | Admitting: Family Medicine

## 2017-01-03 DIAGNOSIS — J069 Acute upper respiratory infection, unspecified: Secondary | ICD-10-CM | POA: Diagnosis not present

## 2017-01-03 DIAGNOSIS — B9789 Other viral agents as the cause of diseases classified elsewhere: Secondary | ICD-10-CM

## 2017-01-03 DIAGNOSIS — J040 Acute laryngitis: Secondary | ICD-10-CM | POA: Diagnosis not present

## 2017-01-03 MED ORDER — PREDNISONE 20 MG PO TABS: ORAL_TABLET | ORAL | 0 refills | Status: DC

## 2017-01-03 MED ORDER — HYDROCODONE-HOMATROPINE 5-1.5 MG/5ML PO SYRP: 5.0000 mL | ORAL_SOLUTION | Freq: Four times a day (QID) | ORAL | 0 refills | Status: DC | PRN

## 2017-01-03 NOTE — ED Triage Notes (Addendum)
Dry painful cough, with right ear pain and pain in the throat, chills, no fever. Started a week ago... Danford BadShanita, student CMA

## 2017-01-03 NOTE — ED Provider Notes (Signed)
[facility]    CSN: [id number] Arrival date & time: 01/03/17  1735     History   Chief Complaint Chief Complaint  Patient presents with  . Cough    HPI Phyllistine DURGA SALDARRIAGA is a 46 y.o. female.   This a 46 year old woman who comes to the [facility] urgent care center with complaint of cough.  She has not had a fever but she does have a sore throat and she's lost her voice today. She did call out sick.      History reviewed. No pertinent past medical history.  There are no active problems to display for this patient.   History reviewed. No pertinent surgical history.  OB History    No data available       Home Medications    Prior to Admission medications   Medication Sig Start Date End Date Taking? Authorizing Provider  Multiple Vitamin (MULTIVITAMIN WITH MINERALS) TABS Take 1 tablet by mouth daily.   Yes Historical Provider, MD  OVER THE COUNTER MEDICATION Take 1 capsule by mouth at bedtime as needed (congestion).   Yes Historical Provider, MD  albuterol (PROVENTIL HFA;VENTOLIN HFA) 108 (90 BASE) MCG/ACT inhaler Inhale 1-2 puffs into the lungs every 6 (six) hours as needed for wheezing or shortness of breath. 12/17/13   Graylon Good, PA-C  amoxicillin (AMOXIL) 250 MG capsule Take 1 capsule (250 mg total) by mouth 2 (two) times daily. 02/01/16   Tharon Aquas, PA  HYDROcodone-homatropine (HYDROMET) 5-1.5 MG/5ML syrup Take 5 mLs by mouth every 6 (six) hours as needed for cough. 01/03/17   Elvina Sidle, MD  predniSONE (DELTASONE) 20 MG tablet Two daily with food 01/03/17   Elvina Sidle, MD    Family History Family History  Problem Relation Age of Onset  . Cancer Mother     Social History Social History  Substance Use Topics  . Smoking status: Never Smoker  . Smokeless tobacco: Never Used  . Alcohol use No     Allergies   Patient has no known allergies.   Review of Systems Review of Systems  Constitutional: Positive for  fatigue. Negative for fever.  HENT: Positive for congestion, ear pain and sore throat.   Respiratory: Positive for cough.   Cardiovascular: Negative.   Neurological: Negative.      Physical Exam Triage Vital Signs ED Triage Vitals  Enc Vitals Group     BP      Pulse      Resp      Temp      Temp src      SpO2      Weight      Height      Head Circumference      Peak Flow      Pain Score      Pain Loc      Pain Edu?      Excl. in GC?    No data found.   Updated Vital Signs BP 124/62 (BP Location: Right Arm)   Pulse 104   Temp 97.8 F (36.6 C) (Oral)   Resp 19   LMP 12/01/2016 (Exact Date)   SpO2 99%    Physical Exam  Constitutional: She is oriented to person, place, and time. She appears well-developed and well-nourished.  HENT:  Head: Normocephalic.  Right Ear: External ear normal.  Left Ear: External ear normal.  Mouth/Throat: Oropharynx is clear and moist.  Hoarse voice  Eyes: Conjunctivae and  EOM are normal. Pupils are equal, round, and reactive to light.  Neck: Normal range of motion. Neck supple.  Cardiovascular: Normal rate, regular rhythm and normal heart sounds.   Pulmonary/Chest: Effort normal and breath sounds normal.  Musculoskeletal: Normal range of motion.  Neurological: She is alert and oriented to person, place, and time.  Skin: Skin is warm and dry.  Nursing note and vitals reviewed.    UC Treatments / Results  Labs (all labs ordered are listed, but only abnormal results are displayed) Labs Reviewed - No data to display  EKG  EKG Interpretation None       Radiology No results found.  Procedures Procedures (including critical care time)  Medications Ordered in UC Medications - No data to display   Initial Impression / Assessment and Plan / UC Course  I have reviewed the triage vital signs and the nursing notes.  Pertinent labs & imaging results that were available during my care of the patient were reviewed by me and  considered in my medical decision making (see chart for details).     Final Clinical Impressions(s) / UC Diagnoses   Final diagnoses:  Viral URI with cough  Laryngitis    New Prescriptions New Prescriptions   HYDROCODONE-HOMATROPINE (HYDROMET) 5-1.5 MG/5ML SYRUP    Take 5 mLs by mouth every 6 (six) hours as needed for cough.   PREDNISONE (DELTASONE) 20 MG TABLET    Two daily with food     Elvina SidleKurt Deforrest Bogle, MD 01/03/17 1815

## 2017-09-01 ENCOUNTER — Encounter (INDEPENDENT_AMBULATORY_CARE_PROVIDER_SITE_OTHER): Payer: Self-pay

## 2017-09-01 ENCOUNTER — Ambulatory Visit (INDEPENDENT_AMBULATORY_CARE_PROVIDER_SITE_OTHER): Payer: BLUE CROSS/BLUE SHIELD | Admitting: Psychiatry

## 2017-09-01 ENCOUNTER — Encounter (HOSPITAL_COMMUNITY): Payer: Self-pay | Admitting: Psychiatry

## 2017-09-01 VITALS — BP 126/78 | HR 87 | Ht 67.0 in | Wt 181.0 lb

## 2017-09-01 DIAGNOSIS — R441 Visual hallucinations: Secondary | ICD-10-CM

## 2017-09-01 DIAGNOSIS — F22 Delusional disorders: Secondary | ICD-10-CM

## 2017-09-01 DIAGNOSIS — R440 Auditory hallucinations: Secondary | ICD-10-CM | POA: Diagnosis not present

## 2017-09-01 DIAGNOSIS — R4583 Excessive crying of child, adolescent or adult: Secondary | ICD-10-CM | POA: Diagnosis not present

## 2017-09-01 DIAGNOSIS — R5383 Other fatigue: Secondary | ICD-10-CM

## 2017-09-01 DIAGNOSIS — F419 Anxiety disorder, unspecified: Secondary | ICD-10-CM | POA: Diagnosis not present

## 2017-09-01 DIAGNOSIS — F3130 Bipolar disorder, current episode depressed, mild or moderate severity, unspecified: Secondary | ICD-10-CM

## 2017-09-01 MED ORDER — QUETIAPINE FUMARATE 200 MG PO TABS: 200.0000 mg | ORAL_TABLET | Freq: Every day | ORAL | 0 refills | Status: DC

## 2017-09-01 MED ORDER — BUPROPION HCL ER (XL) 150 MG PO TB24: 150.0000 mg | ORAL_TABLET | Freq: Every day | ORAL | 0 refills | Status: DC
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
## 2017-09-22 ENCOUNTER — Ambulatory Visit (INDEPENDENT_AMBULATORY_CARE_PROVIDER_SITE_OTHER): Payer: BLUE CROSS/BLUE SHIELD | Admitting: Psychiatry

## 2017-09-22 ENCOUNTER — Encounter (HOSPITAL_COMMUNITY): Payer: Self-pay | Admitting: Psychiatry

## 2017-09-22 VITALS — BP 142/72 | HR 88 | Ht 67.0 in | Wt 176.6 lb

## 2017-09-22 DIAGNOSIS — R441 Visual hallucinations: Secondary | ICD-10-CM

## 2017-09-22 DIAGNOSIS — F419 Anxiety disorder, unspecified: Secondary | ICD-10-CM | POA: Diagnosis not present

## 2017-09-22 DIAGNOSIS — R4583 Excessive crying of child, adolescent or adult: Secondary | ICD-10-CM

## 2017-09-22 DIAGNOSIS — R5383 Other fatigue: Secondary | ICD-10-CM | POA: Diagnosis not present

## 2017-09-22 DIAGNOSIS — R454 Irritability and anger: Secondary | ICD-10-CM | POA: Diagnosis not present

## 2017-09-22 DIAGNOSIS — F3130 Bipolar disorder, current episode depressed, mild or moderate severity, unspecified: Secondary | ICD-10-CM

## 2017-09-22 DIAGNOSIS — R450 Nervousness: Secondary | ICD-10-CM

## 2017-09-22 DIAGNOSIS — G4700 Insomnia, unspecified: Secondary | ICD-10-CM

## 2017-09-22 DIAGNOSIS — R440 Auditory hallucinations: Secondary | ICD-10-CM | POA: Diagnosis not present

## 2017-09-22 MED ORDER — QUETIAPINE FUMARATE 300 MG PO TABS: 300.0000 mg | ORAL_TABLET | Freq: Every day | ORAL | 1 refills | Status: DC

## 2017-09-22 MED ORDER — FLUOXETINE HCL 10 MG PO CAPS: ORAL_CAPSULE | ORAL | 1 refills | Status: DC

## 2017-09-22 NOTE — Progress Notes (Signed)
BH MD/PA/NP OP Progress Note  09/22/2017 10:56 AM Kelly Beasley  MRN:  [id number]  Chief Complaint: I don't see any improvement with the new medication.  I remain very depressed. Chief Complaint    Follow-up     HPI: patient came for her follow-up appointment.  She was seen first time 3 weeks ago as initial evaluation.  She was seeing psychiatrist in [place] who was givingher Zoloft 200 mg daily and Seroquel 200 mg at bedtime.  However she did not see any improvement with the Zoloft and requested to change the medication.  We tried Wellbutrin but she continues to feel sad, having crying spells, poor sleep, irritability and having visual and auditory hallucination. She admitted racing thoughts, getting easily tired, fatigue, lack of energy, crying spells and sometime hopeless.  She recently filed Chapter 13 and she was very worried that she may lose her house but finallythings started to get better and she may able to save the house.  She continues to work multiple jobs and she gets easily tired.  Patient admitted anhedonia, hopelessness but denies any suicidal thoughts or homicidal thoughts.  Her energy level is fair.  Patient denies drinking alcohol or using any illegal substances.  She has no tremors or shakes.  She continues to hear voices and sometimes seeing shadows especially in the nighttime. She also feels people calling her name.  She has panic attacks specially at the nighttime when she is by herself.  She lives by herself.  She has family lives in [place].  She admitted buying impulsively and sometime having manic-like symptoms.   Visit Diagnosis:    ICD-10-CM   1. Bipolar I disorder, most recent episode depressed (HCC) F31.30 QUEtiapine (SEROQUEL) 300 MG tablet    FLUoxetine (PROZAC) 10 MG capsule    Past Psychiatric History: reviewed. Patient denies any history of psychiatric inpatient treatment or any suicidal attempt.  However she struggled with the depression most of her  life.  She had history of passive and fleeting suicidal thoughts.  She started taking medication a few months ago.  Initially she started Cymbalta from her primary care physician with limited response.  She also tried Sonata, trazodone, Klonopin and over-the-counter including NyQuil for insomnia.  She started seeing Dr. Lourdes Sledge 2 months ago at [facility].  She tried Trintellix which helped but she could not afford it.  Patient reported history of manic-like symptoms which described irritability, self talking, impulsive buying, shopping and excessive talking.  Past Medical History: History reviewed. No pertinent past medical history. History reviewed. No pertinent surgical history.  Family Psychiatric History: eviewed.  Family History:  Family History  Problem Relation Age of Onset  . Cancer Mother     Social History:  Social History   Social History  . Marital status: Divorced    Spouse name: N/A  . Number of children: N/A  . Years of education: N/A   Social History Main Topics  . Smoking status: Never Smoker  . Smokeless tobacco: Never Used  . Alcohol use No  . Drug use: No  . Sexual activity: No   Other Topics Concern  . None   Social History Narrative  . None    Allergies: No Known Allergies  Metabolic Disorder Labs: No results found for: HGBA1C, MPG No results found for: PROLACTIN No results found for: CHOL, TRIG, HDL, CHOLHDL, VLDL, LDLCALC No results found for: TSH  Therapeutic Level Labs: No results found for: LITHIUM No results found for:  VALPROATE No components found for:  CBMZ  Current Medications: Current Outpatient Prescriptions  Medication Sig Dispense Refill  . buPROPion (WELLBUTRIN XL) 150 MG 24 hr tablet Take 1 tablet (150 mg total) by mouth daily. 30 tablet 0  . QUEtiapine (SEROQUEL) 200 MG tablet Take 1 tablet (200 mg total) by mouth at bedtime. 30 tablet 0   No current facility-administered medications for this  visit.      Musculoskeletal: Strength & Muscle Tone: within normal limits Gait & Station: normal Patient leans: N/A  Psychiatric Specialty Exam: Review of Systems  Constitutional: Negative.   HENT: Negative.   Eyes: Negative.   Cardiovascular: Negative.   Gastrointestinal: Negative.   Genitourinary: Negative.   Musculoskeletal: Negative.   Skin: Negative.   Neurological: Negative.   Psychiatric/Behavioral: Positive for depression and hallucinations. The patient is nervous/anxious and has insomnia.     Blood pressure (!) 142/72, pulse 88, height 5\' 7"  (1.702 m), weight 176 lb 9.6 oz (80.1 kg).Body mass index is 27.66 kg/m.  General Appearance: Casual  Eye Contact:  Fair  Speech:  Slow  Volume:  Decreased  Mood:  Anxious and Dysphoric  Affect:  Depressed  Thought Process:  Goal Directed  Orientation:  Full (Time, Place, and Person)  Thought Content: Hallucinations: Auditory Visual seeing shadows and believed people calling her name, Paranoid Ideation and Rumination   Suicidal Thoughts:  No  Homicidal Thoughts:  No  Memory:  Immediate;   Good Recent;   Good Remote;   Good  Judgement:  Good  Insight:  Good  Psychomotor Activity:  Normal  Concentration:  Concentration: Fair and Attention Span: Fair  Recall:  Good  Fund of Knowledge: Good  Language: Good  Akathisia:  No  Handed:  Right  AIMS (if indicated): not done  Assets:  Communication Skills Desire for Improvement Housing Resilience  ADL's:  Intact  Cognition: WNL  Sleep:  Fair   Screenings:   Assessment and Plan: major depressive disorder, recurrent.  Bipolar disorder depressed type.  Anxiety disorder NOS.  I review her symptoms, history and current medication.  I will discontinue Wellbutrin since it is not helping her depressive symptoms.  Recommended to increase Seroquel 300 mg to help hallucination, paranoia and poor sleep.  We will tries Prozac which she had never tried before.  Started 10 mg daily  for 1 week and then 20 mg daily. Patient scheduled to see a therapist on November 5.  Encouraged to keep appointment.  He is still waiting records from [facility].  Discuss safety plan that anytime having active suicidal thoughts or homicidal thoughts then she need to call 911 on the local emergency room.  Follow-up in 4-6 weeks.  Time spent 25 minutes   Gage Weant T., MD 09/22/2017, 10:56 AM

## 2017-09-29 ENCOUNTER — Ambulatory Visit (HOSPITAL_COMMUNITY): Payer: Self-pay | Admitting: Licensed Clinical Social Worker

## 2017-09-29 ENCOUNTER — Other Ambulatory Visit (HOSPITAL_COMMUNITY): Payer: Self-pay

## 2017-09-29 DIAGNOSIS — F3130 Bipolar disorder, current episode depressed, mild or moderate severity, unspecified: Secondary | ICD-10-CM

## 2017-09-29 MED ORDER — QUETIAPINE FUMARATE 300 MG PO TABS: 300.0000 mg | ORAL_TABLET | Freq: Every day | ORAL | 1 refills | Status: DC

## 2017-09-29 MED ORDER — FLUOXETINE HCL 10 MG PO CAPS: ORAL_CAPSULE | ORAL | 1 refills | Status: DC

## 2017-10-25 ENCOUNTER — Other Ambulatory Visit (HOSPITAL_COMMUNITY): Payer: Self-pay | Admitting: Psychiatry

## 2017-10-25 DIAGNOSIS — F3130 Bipolar disorder, current episode depressed, mild or moderate severity, unspecified: Secondary | ICD-10-CM

## 2017-11-14 ENCOUNTER — Other Ambulatory Visit (HOSPITAL_COMMUNITY): Payer: Self-pay

## 2017-11-14 DIAGNOSIS — F3130 Bipolar disorder, current episode depressed, mild or moderate severity, unspecified: Secondary | ICD-10-CM

## 2017-11-14 MED ORDER — QUETIAPINE FUMARATE 300 MG PO TABS: 300.0000 mg | ORAL_TABLET | Freq: Every day | ORAL | 1 refills | Status: DC

## 2017-11-17 ENCOUNTER — Other Ambulatory Visit (HOSPITAL_COMMUNITY): Payer: Self-pay

## 2017-11-17 DIAGNOSIS — F3130 Bipolar disorder, current episode depressed, mild or moderate severity, unspecified: Secondary | ICD-10-CM

## 2017-11-17 MED ORDER — QUETIAPINE FUMARATE 300 MG PO TABS: 300.0000 mg | ORAL_TABLET | Freq: Every day | ORAL | 0 refills | Status: DC

## 2017-12-01 ENCOUNTER — Ambulatory Visit (HOSPITAL_COMMUNITY): Payer: Self-pay | Admitting: Psychiatry

## 2017-12-30 ENCOUNTER — Other Ambulatory Visit (HOSPITAL_COMMUNITY): Payer: Self-pay | Admitting: Psychiatry

## 2017-12-30 DIAGNOSIS — F3130 Bipolar disorder, current episode depressed, mild or moderate severity, unspecified: Secondary | ICD-10-CM

## 2018-01-03 ENCOUNTER — Ambulatory Visit (HOSPITAL_COMMUNITY): Payer: Self-pay | Admitting: Psychiatry

## 2018-01-10 ENCOUNTER — Other Ambulatory Visit (HOSPITAL_COMMUNITY): Payer: Self-pay

## 2018-01-10 DIAGNOSIS — F3130 Bipolar disorder, current episode depressed, mild or moderate severity, unspecified: Secondary | ICD-10-CM

## 2018-01-10 MED ORDER — FLUOXETINE HCL 10 MG PO CAPS: ORAL_CAPSULE | ORAL | 0 refills | Status: DC

## 2018-01-15 ENCOUNTER — Ambulatory Visit (HOSPITAL_COMMUNITY): Payer: Self-pay | Admitting: Psychiatry

## 2018-01-21 ENCOUNTER — Ambulatory Visit (HOSPITAL_COMMUNITY): Payer: Self-pay | Admitting: Psychiatry

## 2018-02-07 ENCOUNTER — Other Ambulatory Visit (HOSPITAL_COMMUNITY): Payer: Self-pay

## 2018-02-07 DIAGNOSIS — F3130 Bipolar disorder, current episode depressed, mild or moderate severity, unspecified: Secondary | ICD-10-CM

## 2018-02-07 MED ORDER — FLUOXETINE HCL 10 MG PO CAPS: ORAL_CAPSULE | ORAL | 0 refills | Status: DC

## 2018-02-12 ENCOUNTER — Ambulatory Visit (HOSPITAL_COMMUNITY): Payer: Self-pay | Admitting: Psychiatry

## 2018-02-18 ENCOUNTER — Ambulatory Visit (HOSPITAL_COMMUNITY): Payer: BLUE CROSS/BLUE SHIELD | Admitting: Psychiatry

## 2018-02-18 ENCOUNTER — Other Ambulatory Visit (HOSPITAL_COMMUNITY): Payer: Self-pay

## 2018-02-18 DIAGNOSIS — F3130 Bipolar disorder, current episode depressed, mild or moderate severity, unspecified: Secondary | ICD-10-CM

## 2018-02-18 MED ORDER — FLUOXETINE HCL 10 MG PO CAPS: ORAL_CAPSULE | ORAL | 0 refills | Status: DC

## 2018-02-18 NOTE — Progress Notes (Signed)
Sent in 30 tabs, per Regan. Next appointment is 03/02/18

## 2018-03-02 ENCOUNTER — Ambulatory Visit (INDEPENDENT_AMBULATORY_CARE_PROVIDER_SITE_OTHER): Payer: BLUE CROSS/BLUE SHIELD | Admitting: Psychiatry

## 2018-03-02 ENCOUNTER — Encounter (HOSPITAL_COMMUNITY): Payer: Self-pay | Admitting: Psychiatry

## 2018-03-02 DIAGNOSIS — F419 Anxiety disorder, unspecified: Secondary | ICD-10-CM | POA: Diagnosis not present

## 2018-03-02 DIAGNOSIS — G4700 Insomnia, unspecified: Secondary | ICD-10-CM | POA: Diagnosis not present

## 2018-03-02 DIAGNOSIS — R450 Nervousness: Secondary | ICD-10-CM | POA: Diagnosis not present

## 2018-03-02 DIAGNOSIS — F3130 Bipolar disorder, current episode depressed, mild or moderate severity, unspecified: Secondary | ICD-10-CM | POA: Diagnosis not present

## 2018-03-02 MED ORDER — FLUOXETINE HCL 20 MG PO CAPS: 20.0000 mg | ORAL_CAPSULE | Freq: Every day | ORAL | 0 refills | Status: DC

## 2018-03-02 MED ORDER — QUETIAPINE FUMARATE 400 MG PO TABS: 400.0000 mg | ORAL_TABLET | Freq: Every day | ORAL | 0 refills | Status: DC

## 2018-03-02 NOTE — Progress Notes (Signed)
BH MD/PA/NP OP Progress Note  03/02/2018 1:37 PM Kelly Beasley  MRN:  [id number]  Chief Complaint: I like new medication.  I apologized missing appointment.  HPI: Patient came for her follow-up appointment.  She missed last appointment.  She apologized because she is working 2 jobs and also enrolled in a school.  She like Prozac but she forgot to take second dose and only taking 10 mg.  She feel her anxiety somewhat better with the Prozac but dose is not strong enough.  She continued to have hallucination, paranoia and sometimes she feel people calling her name.  She feels very anxious and nervous.  Sometimes she have crying spells, poor sleep irritability but denies any suicidal thoughts or homicidal thought.  Her energy level is fair.  She recently filed chapter 13 and worried about her losing her house.  She is working at [employer] on the weekend.  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  She gets easily tired.  She feels sometimes hopeless but denies any self abusive behavior.  She gets very scared when she is by herself especially at night.  She lives by herself.  Her family lives in [place].  Patient has history of impulsive buying and manic-like symptoms but recently she has not done any of these behaviors.  Patient denies drinking alcohol or using any illegal substances.   Visit Diagnosis:    ICD-10-CM   1. Bipolar I disorder, most recent episode depressed (HCC) F31.30 QUEtiapine (SEROQUEL) 400 MG tablet    FLUoxetine (PROZAC) 20 MG capsule    Past Psychiatric History: Reviewed Patient struggled with depression most of her life.  She has history of passive and fleeting suicidal thoughts but denies any psychiatric inpatient treatment or any suicidal attempt.  In the past she has taken Cymbalta from primary care physician with limited response.  She also tried Restaurant manager, fast foodonata, trazodone, Klonopin and over-the-counter NyQuil for insomnia.  She was [facility].  She remember Trentellix help but she could not afford it.  She has a history of impulsive buying, shopping and excessive talking.  We have tried Zoloft, Wellbutrin with limited response.  Past Medical History: No past medical history on file. No past surgical history on file.  Family Psychiatric History: Viewed  Family History:  Family History  Problem Relation Age of Onset  . Cancer Mother     Social History:  Social History   Socioeconomic History  . Marital status: Divorced    Spouse name: Not on file  . Number of children: Not on file  . Years of education: Not on file  . Highest education level: Not on file  Occupational History  . Not on file  Social Needs  . Financial resource strain: Not on file  . Food insecurity:    Worry: Not on file    Inability: Not on file  . Transportation needs:    Medical: Not on file    Non-medical: Not on file  Tobacco Use  . Smoking status: Never Smoker  . Smokeless tobacco: Never Used  Substance and Sexual Activity  . Alcohol use: No  . Drug use: No  . Sexual activity: Never    Birth control/protection: Surgical  Lifestyle  . Physical activity:    Days per week: Not on file    Minutes per session: Not on file  . Stress: Not on file  Relationships  . Social connections:    Talks on  phone: Not on file    Gets together: Not on file    Attends religious service: Not on file    Active member of club or organization: Not on file    Attends meetings of clubs or organizations: Not on file    Relationship status: Not on file  Other Topics Concern  . Not on file  Social History Narrative  . Not on file    Allergies: No Known Allergies  Metabolic Disorder Labs: No results found for: HGBA1C, MPG No results found for: PROLACTIN No results found for: CHOL, TRIG, HDL, CHOLHDL, VLDL, LDLCALC No results found for: TSH  Therapeutic Level Labs: No results found for: LITHIUM No results found for: VALPROATE No  components found for:  CBMZ  Current Medications: Current Outpatient Medications  Medication Sig Dispense Refill  . FLUoxetine (PROZAC) 10 MG capsule 2 capsule daily 30 capsule 0  . QUEtiapine (SEROQUEL) 300 MG tablet Take 1 tablet (300 mg total) by mouth at bedtime. 90 tablet 0   No current facility-administered medications for this visit.      Musculoskeletal: Strength & Muscle Tone: within normal limits Gait & Station: normal Patient leans: N/A  Psychiatric Specialty Exam: Review of Systems  HENT: Negative.   Respiratory: Negative.   Cardiovascular: Negative.   Skin: Negative.   Neurological: Negative.   Psychiatric/Behavioral: Positive for hallucinations. The patient is nervous/anxious and has insomnia.     Blood pressure (!) 149/92, pulse 90, height 5\' 7"  (1.702 m), weight 183 lb 3.2 oz (83.1 kg).There is no height or weight on file to calculate BMI.  General Appearance: Casual  Eye Contact:  Fair  Speech:  Clear and Coherent  Volume:  Normal  Mood:  Anxious and Dysphoric  Affect:  Congruent  Thought Process:  Goal Directed  Orientation:  Full (Time, Place, and Person)  Thought Content: Hallucinations: Seeing shadows and endorsed people calling her name. and Paranoid Ideation   Suicidal Thoughts:  No  Homicidal Thoughts:  No  Memory:  Immediate;   Good Recent;   Good Remote;   Good  Judgement:  Good  Insight:  Good  Psychomotor Activity:  Normal  Concentration:  Concentration: Fair and Attention Span: Fair  Recall:  Good  Fund of Knowledge: Good  Language: Good  Akathisia:  No  Handed:  Right  AIMS (if indicated): not done  Assets:  Communication Skills Desire for Improvement Housing Resilience  ADL's:  Intact  Cognition: WNL  Sleep:  Fair   Screenings:   Assessment and Plan: Bipolar disorder, depressed type.  Anxiety disorder NOS.  We are still waiting records from [facility].  She like Prozac but she did forgot to increase  the dose to 20 mg.  Reinforced to take Prozac 20 mg.  On her last visit we increase Seroquel which help some of her paranoia.  She is tolerating medication without any side effects.  I recommended to try Seroquel 400 mg to help her residual paranoia and hallucination.  Patient is very busy and she has no time for counseling.  Discussed medication side effects and benefits.  Patient recently seen her primary care physician and she was told everything is normal.  We will try to get records from her primary care physician.  I recommended to call us back if she has any question, concern if she feels worsening of the symptoms.  Follow-up in 4 weeks.  We will provide only 30-day supply as patient will come back in 4 weeks.Time  spent 25 minutes.  More than 50% of the time spent in psychoeducation, counseling and coordination of care.  Discuss safety plan that anytime having active suicidal thoughts or homicidal thoughts then patient need to call 911 or go to the local emergency room.     Cleotis Nipper, MD 03/02/2018, 1:37 PM

## 2018-03-24 ENCOUNTER — Other Ambulatory Visit (HOSPITAL_COMMUNITY): Payer: Self-pay | Admitting: Psychiatry

## 2018-03-24 DIAGNOSIS — F3130 Bipolar disorder, current episode depressed, mild or moderate severity, unspecified: Secondary | ICD-10-CM

## 2018-03-25 ENCOUNTER — Other Ambulatory Visit (HOSPITAL_COMMUNITY): Payer: Self-pay | Admitting: Psychiatry

## 2018-03-25 DIAGNOSIS — F3130 Bipolar disorder, current episode depressed, mild or moderate severity, unspecified: Secondary | ICD-10-CM

## 2018-03-25 MED ORDER — FLUOXETINE HCL 20 MG PO CAPS: 20.0000 mg | ORAL_CAPSULE | Freq: Every day | ORAL | 0 refills | Status: DC

## 2018-03-25 MED ORDER — QUETIAPINE FUMARATE 400 MG PO TABS: 400.0000 mg | ORAL_TABLET | Freq: Every day | ORAL | 0 refills | Status: DC

## 2018-04-08 ENCOUNTER — Other Ambulatory Visit (HOSPITAL_COMMUNITY): Payer: Self-pay | Admitting: Psychiatry

## 2018-04-08 DIAGNOSIS — F3130 Bipolar disorder, current episode depressed, mild or moderate severity, unspecified: Secondary | ICD-10-CM

## 2018-04-09 ENCOUNTER — Ambulatory Visit (INDEPENDENT_AMBULATORY_CARE_PROVIDER_SITE_OTHER): Payer: BLUE CROSS/BLUE SHIELD | Admitting: Psychiatry

## 2018-04-09 ENCOUNTER — Encounter (HOSPITAL_COMMUNITY): Payer: Self-pay | Admitting: Psychiatry

## 2018-04-09 DIAGNOSIS — F419 Anxiety disorder, unspecified: Secondary | ICD-10-CM

## 2018-04-09 DIAGNOSIS — F3130 Bipolar disorder, current episode depressed, mild or moderate severity, unspecified: Secondary | ICD-10-CM

## 2018-04-09 MED ORDER — FLUOXETINE HCL 20 MG PO CAPS: 20.0000 mg | ORAL_CAPSULE | Freq: Every day | ORAL | 0 refills | Status: DC

## 2018-04-09 MED ORDER — QUETIAPINE FUMARATE 400 MG PO TABS: 400.0000 mg | ORAL_TABLET | Freq: Every day | ORAL | 0 refills | Status: DC

## 2018-04-09 NOTE — Progress Notes (Signed)
BH MD/PA/NP OP Progress Note  04/09/2018 4:37 PM Kelly Beasley  MRN:  [id number]  Chief Complaint: I am doing much better.  I am sleeping good.  I have no more paranoia and anxiety attacks.  HPI: Patient came for her follow-up appointment.  On her last visit we increased Prozac and Seroquel.  She is tolerating very well her medication.  Her sleep is improved and she denies any recent paranoia, hallucination, anxiety or any panic attacks.  She is working 2 jobs and also enrolled in the school.  She is taking summer classes at [employer].  Her job is going well.  She denies any crying spells or any feeling of hopelessness or worthlessness.  She lives by herself.  Patient denies any recent impulsive behavior or manic-like symptoms.  She admitted weight gain since the last visit but she is going to start regular exercise and watch her calorie intake.  Patient denies drinking or using any illegal substances.  Her energy level is improved.  Patient works at [employer] on the weekend.  Visit Diagnosis:    ICD-10-CM   1. Bipolar I disorder, most recent episode depressed (HCC) F31.30 QUEtiapine (SEROQUEL) 400 MG tablet    FLUoxetine (PROZAC) 20 MG capsule    Past Psychiatric History: Reviewed. Patient struggled with depression most of her life.  She has history of passive and fleeting suicidal thoughts but denies any psychiatric inpatient treatment or any suicidal attempt.  In the past she has taken Cymbalta from primary care physician with limited response.  She also tried Restaurant manager, fast food, trazodone, Klonopin and over-the-counter NyQuil for insomnia.  She was seeing Dr. Lourdes Sledge at [facility].  She took Trentellix help but she could not afford it.  She has a history of impulsive buying, shopping and excessive talking.  We have tried Zoloft, Wellbutrin with limited response.  Past Medical History: No past medical history on file. No past surgical history on file.  Family Psychiatric History:  Reviewed.  Family History:  Family History  Problem Relation Age of Onset  . Cancer Mother     Social History:  Social History   Socioeconomic History  . Marital status: Divorced    Spouse name: Not on file  . Number of children: Not on file  . Years of education: Not on file  . Highest education level: Not on file  Occupational History  . Not on file  Social Needs  . Financial resource strain: Not on file  . Food insecurity:    Worry: Not on file    Inability: Not on file  . Transportation needs:    Medical: Not on file    Non-medical: Not on file  Tobacco Use  . Smoking status: Never Smoker  . Smokeless tobacco: Never Used  Substance and Sexual Activity  . Alcohol use: No  . Drug use: No  . Sexual activity: Never    Birth control/protection: Surgical  Lifestyle  . Physical activity:    Days per week: Not on file    Minutes per session: Not on file  . Stress: Not on file  Relationships  . Social connections:    Talks on phone: Not on file    Gets together: Not on file    Attends religious service: Not on file    Active member of club or organization: Not on file    Attends meetings of clubs or organizations: Not on file    Relationship status: Not on file  Other Topics Concern  .  Not on file  Social History Narrative  . Not on file    Allergies: No Known Allergies  Metabolic Disorder Labs: No results found for: HGBA1C, MPG No results found for: PROLACTIN No results found for: CHOL, TRIG, HDL, CHOLHDL, VLDL, LDLCALC No results found for: TSH  Therapeutic Level Labs: No results found for: LITHIUM No results found for: VALPROATE No components found for:  CBMZ  Current Medications: Current Outpatient Medications  Medication Sig Dispense Refill  . FLUoxetine (PROZAC) 20 MG capsule Take 1 capsule (20 mg total) by mouth daily. 30 capsule 0  . QUEtiapine (SEROQUEL) 400 MG tablet Take 1 tablet (400 mg total) by mouth at bedtime. 30 tablet 0   No  current facility-administered medications for this visit.      Musculoskeletal: Strength & Muscle Tone: within normal limits Gait & Station: normal Patient leans: N/A  Psychiatric Specialty Exam: ROS  Blood pressure 123/76, pulse 90, height  (1.702 m), weight 186 lb (84.4 kg), SpO2 99 %.There is no height or weight on file to calculate BMI.  General Appearance: Casual  Eye Contact:  Good  Speech:  Clear and Coherent  Volume:  Normal  Mood:  pleasenet  Affect:  Congruent  Thought Process:  Goal Directed  Orientation:  Full (Time, Place, and Person)  Thought Content: Rumination   Suicidal Thoughts:  No  Homicidal Thoughts:  No  Memory:  Immediate;   Good Recent;   Good Remote;   Good  Judgement:  Good  Insight:  Good  Psychomotor Activity:  Normal  Concentration:  Concentration: Good and Attention Span: Good  Recall:  Good  Fund of Knowledge: Good  Language: Good  Akathisia:  No  Handed:  Right  AIMS (if indicated): not done  Assets:  Communication Skills Desire for Improvement Housing Physical Health Resilience Social Support  ADL's:  Intact  Cognition: WNL  Sleep:  Good   Screenings:   Assessment and Plan: Bipolar disorder type I.  Anxiety disorder NOS.  Patient doing better on her current medication.  She wants to continue Seroquel 400 mg at bedtime and Prozac 20 mg daily.  Discussed medication side effect especially weight gain with Seroquel.  Encourage healthy lifestyle and watch her calorie intake.  I also reviewed blood work results which was done in February 2019.  Recommended to call us back if she has any question, concern for feel worsening of the symptoms.  Follow-up in 3 months.  Patient does not have a time to do counseling.   Cleotis Nipper, MD 04/09/2018, 4:37 PM

## 2018-07-05 ENCOUNTER — Other Ambulatory Visit (HOSPITAL_COMMUNITY): Payer: Self-pay | Admitting: Psychiatry

## 2018-07-05 DIAGNOSIS — F3130 Bipolar disorder, current episode depressed, mild or moderate severity, unspecified: Secondary | ICD-10-CM

## 2018-07-08 NOTE — Telephone Encounter (Signed)
Patient seen 04/09/18 and given a 90 day supply of medication but appointment for 07/09/18 was cancelled to be rescheduled due to Dr. Lolly MustacheArfeen out on medical leave at this time.  New 90 day order authorized by Dr. Lucianne MussKumar and e-scribed to patients [facility] with request patient reschedule an appointment with Dr. Lolly MustacheArfeen in the next 3 months. New order for 90 days e-scribed as approved and authorized.

## 2018-07-09 ENCOUNTER — Ambulatory Visit (HOSPITAL_COMMUNITY): Payer: Self-pay | Admitting: Psychiatry

## 2018-07-10 ENCOUNTER — Other Ambulatory Visit (HOSPITAL_COMMUNITY): Payer: Self-pay | Admitting: Psychiatry

## 2018-07-10 DIAGNOSIS — F3130 Bipolar disorder, current episode depressed, mild or moderate severity, unspecified: Secondary | ICD-10-CM

## 2018-07-24 ENCOUNTER — Other Ambulatory Visit (HOSPITAL_COMMUNITY): Payer: Self-pay | Admitting: Psychiatry

## 2018-07-24 DIAGNOSIS — F3130 Bipolar disorder, current episode depressed, mild or moderate severity, unspecified: Secondary | ICD-10-CM

## 2018-07-30 ENCOUNTER — Ambulatory Visit (HOSPITAL_COMMUNITY): Payer: Self-pay | Admitting: Psychiatry

## 2018-08-27 ENCOUNTER — Other Ambulatory Visit: Payer: Self-pay

## 2018-08-27 ENCOUNTER — Encounter (HOSPITAL_COMMUNITY): Payer: Self-pay | Admitting: Psychiatry

## 2018-08-27 ENCOUNTER — Ambulatory Visit (INDEPENDENT_AMBULATORY_CARE_PROVIDER_SITE_OTHER): Payer: BLUE CROSS/BLUE SHIELD | Admitting: Psychiatry

## 2018-08-27 VITALS — BP 142/91 | HR 108 | Resp 16 | Wt 193.6 lb

## 2018-08-27 DIAGNOSIS — G4700 Insomnia, unspecified: Secondary | ICD-10-CM

## 2018-08-27 DIAGNOSIS — F419 Anxiety disorder, unspecified: Secondary | ICD-10-CM

## 2018-08-27 DIAGNOSIS — F3130 Bipolar disorder, current episode depressed, mild or moderate severity, unspecified: Secondary | ICD-10-CM

## 2018-08-27 HISTORY — DX: Bipolar disorder, current episode depressed, mild or moderate severity, unspecified: F31.30

## 2018-08-27 MED ORDER — FLUOXETINE HCL 40 MG PO CAPS: ORAL_CAPSULE | ORAL | 1 refills | Status: DC

## 2018-08-27 MED ORDER — OLANZAPINE 5 MG PO TABS: 5.0000 mg | ORAL_TABLET | Freq: Every day | ORAL | 1 refills | Status: DC

## 2018-08-27 NOTE — Progress Notes (Signed)
BH MD/PA/NP OP Progress Note  08/27/2018 4:35 PM Kelly Beasley  MRN:  [id number]  Chief Complaint:  insomnia   HPI: I am seeing patient today for a bridging appointment.  Her psychiatrist is Kelly Beasley and she will continue to follow up with him in the future. Pt continues to work 2 jobs and take classes at [employer].  Patient does feel overwhelmed and stressed out.    Pt denies recent episodes of manic and hypomanic symptoms including periods of decreased need for sleep, increased energy, mood lability, impulsivity, FOI, and excessive spending.  Pt states she can't focus in class. At home she notes she is having trouble staying on task and is not finishing anything.  Kelly Beasley feels like something feels something is moving under her skin- back and head. It happens for most of the day. It resolves when at bedtime but starts again as soon as she wakes up.  Patient reports that she has a history of severe hallucinations in the past and wonders if they are starting again.  She also wonders if it could simply be a symptom of her overwhelming anxiety.. She is sleeping well but has increased her Seroquel to 800mg  for the last one month. She was not sleeping thru the night with 400mg . She is always tired.  She reports a long history of developing tolerance to  sleep aids.  Nothing seems to work for more than a month or 2.  Kelly Beasley also reports that she feels depressed about 2 days of each week.  During that time she has low energy and low motivation.  She wants to isolate in her room and sleep the days away due to her sadness.  Sometimes she will have brief passing thoughts of death but states it has never something she would act on.  She wants to live for her children.  Visit Diagnosis:    ICD-10-CM   1. Insomnia, unspecified type G47.00 OLANZapine (ZYPREXA) 5 MG tablet  2. Bipolar I disorder, most recent episode depressed (HCC) F31.30 FLUoxetine (PROZAC) 40 MG capsule    OLANZapine (ZYPREXA) 5 MG tablet        Past Psychiatric History: reviewed. Patient struggled with depression most of her life.  She has history of passive and fleeting suicidal thoughts but denies any psychiatric inpatient treatment or any suicidal attempt.  In the past she has taken Cymbalta from primary care physician with limited response.  She also tried Restaurant manager, fast food, trazodone, Klonopin and over-the-counter NyQuil for insomnia.  She was seeing Kelly Beasley at [facility].  She took [employer] help but she could not afford it.  She has a history of impulsive buying, shopping and excessive talking.  We have tried Zoloft, Wellbutrin with limited response.   Past Medical History: No past medical history on file. No past surgical history on file.  Family Psychiatric History: denies. Mom also has chronic issues with insomnia Family History:  Family History  Problem Relation Age of Onset  . Cancer Mother     Social History:  Social History   Socioeconomic History  . Marital status: Divorced    Spouse name: Not on file  . Number of children: Not on file  . Years of education: Not on file  . Highest education level: Not on file  Occupational History  . Not on file  Social Needs  . Financial resource strain: Not on file  . Food insecurity:    Worry: Not on file    Inability: Not  on file  . Transportation needs:    Medical: Not on file    Non-medical: Not on file  Tobacco Use  . Smoking status: Never Smoker  . Smokeless tobacco: Never Used  Substance and Sexual Activity  . Alcohol use: No  . Drug use: No  . Sexual activity: Never    Birth control/protection: Surgical  Lifestyle  . Physical activity:    Days per week: Not on file    Minutes per session: Not on file  . Stress: Not on file  Relationships  . Social connections:    Talks on phone: Not on file    Gets together: Not on file    Attends religious service: Not on file    Active member of club or organization: Not on file    Attends  meetings of clubs or organizations: Not on file    Relationship status: Not on file  Other Topics Concern  . Not on file  Social History Narrative  . Not on file    Allergies: No Known Allergies  Metabolic Disorder Labs: No results found for: HGBA1C, MPG No results found for: PROLACTIN No results found for: CHOL, TRIG, HDL, CHOLHDL, VLDL, LDLCALC No results found for: TSH  Therapeutic Level Labs: No results found for: LITHIUM No results found for: VALPROATE No components found for:  CBMZ  Current Medications: Current Outpatient Medications  Medication Sig Dispense Refill  . FLUoxetine (PROZAC) 40 MG capsule TAKE 1 CAPSULE BY MOUTH EVERY DAY 30 capsule 1  . OLANZapine (ZYPREXA) 5 MG tablet Take 1 tablet (5 mg total) by mouth at bedtime. 30 tablet 1   No current facility-administered medications for this visit.      Musculoskeletal: Strength & Muscle Tone: within normal limits Gait & Station: normal Patient leans: N/A  Psychiatric Specialty Exam: Review of Systems  Gastrointestinal: Positive for heartburn. Negative for constipation, diarrhea, nausea and vomiting.  Musculoskeletal: Negative for back pain, joint pain, myalgias and neck pain.    Blood pressure (!) 142/91, pulse (!) 108, resp. rate 16, weight 193 lb 9.6 oz (87.8 kg), last menstrual period 08/17/2018, SpO2 97 %.Body mass index is 30.32 kg/m.  General Appearance: Casual  Eye Contact:  Good  Speech:  Clear and Coherent and Normal Rate  Volume:  Normal  Mood:  Anxious and Depressed  Affect:  Congruent  Thought Process:  Linear and Descriptions of Associations: Intact  Orientation:  Full (Time, Place, and Person)  Thought Content:  Rumination  Suicidal Thoughts:  No  Homicidal Thoughts:  No  Memory:  Immediate;   Good  Judgement:  Fair  Insight:  Fair  Psychomotor Activity:  Normal  Concentration:  Concentration: Poor  Recall:  Poor  Fund of Knowledge:  Good  Language:  Good  Akathisia:  No   Handed:  Right  AIMS (if indicated):     Assets:  Communication Skills Desire for Improvement Resilience Social Support Talents/Skills Transportation  ADL's:  Intact  Cognition:  WNL  Sleep:   poor    Assessment and Plan: Bipolar 1 disorder- current episode unspecified; anxiety NOS   Medication management with supportive therapy. Risks and benefits, side effects and alternative treatment options discussed with patient. Pt was given an opportunity to ask questions about medication, illness, and treatment. All current psychiatric medications have been reviewed and discussed with the patient and adjusted as clinically appropriate. The patient has been provided an accurate and updated list of the medications being now prescribed. Pt verbalized understanding and  verbal consent obtained for treatment.   Status of current problems: ongoing insomnia and depression symptoms  Meds: increase Prozac to 40mg  po qD D/c Seroquel  Pt reports a hx of tolerance to sleep aids quickly Start trial of Zyprexa 5mg  po qHS for mood, psychosis and insomnia Patient is asking for benzo to help her focus in class.  I recommended we get her insomnia and depression symptoms under control before addressing her concentration issues.   Labs: none  Therapy: brief supportive therapy provided. Discussed psychosocial stressors in detail.     Consultations: Encouraged to follow up with PCP due to recent health issues and consider a sleep clinic referral  Patient told to call clinic if any problems occur. Patient advised to go to ER if they should develop SI/HI, side effects, or if symptoms worsen. Pt has crisis numbers to call if needed. Pt acknowledged and agreed with plan and verbalized understanding.  F/up in 1 months or sooner if needed with KellyArfeen  The duration of this appointment visit was 20 minutes of face-to-face time with the patient.  Greater than 50% of this time was spent in counseling, explanation  of  diagnosis, planning of further management, and coordination of care   Oletta Darter, MD 08/27/2018, 4:35 PM

## 2018-10-01 ENCOUNTER — Encounter (HOSPITAL_COMMUNITY): Payer: Self-pay | Admitting: Psychiatry

## 2018-10-01 ENCOUNTER — Ambulatory Visit (INDEPENDENT_AMBULATORY_CARE_PROVIDER_SITE_OTHER): Payer: BLUE CROSS/BLUE SHIELD | Admitting: Psychiatry

## 2018-10-01 DIAGNOSIS — G4700 Insomnia, unspecified: Secondary | ICD-10-CM

## 2018-10-01 DIAGNOSIS — F3130 Bipolar disorder, current episode depressed, mild or moderate severity, unspecified: Secondary | ICD-10-CM

## 2018-10-01 MED ORDER — FLUOXETINE HCL 40 MG PO CAPS: ORAL_CAPSULE | ORAL | 1 refills | Status: DC

## 2018-10-01 MED ORDER — OLANZAPINE 7.5 MG PO TABS: 7.5000 mg | ORAL_TABLET | Freq: Every day | ORAL | 1 refills | Status: DC

## 2018-10-01 NOTE — Progress Notes (Signed)
BH MD/PA/NP OP Progress Note  10/01/2018 3:55 PM Kelly Beasley  MRN:  [id number]  Chief Complaint:  follow up   HPI: Patient states that she follows up with whichever psychiatrist she is scheduled with.  Today she tells me that she is doing much better.  Her depression and insomnia have dramatically improved with Zyprexa.  She is no longer feeling sad, unmotivated and withdrawn.  She is no longer isolating and enjoys doing things.  She continues to go to class and notes that she is not doing well as she cannot focus when she is taking an exam due to extreme anxiety.  That is really the only thing that she is worried about at this point.  Her sleep is better and she is getting about 8 hours a night.  This has improved her energy and outlook overall.  She usually works 2 jobs and has cut back her hours at 1 of the jobs.  This is also helped is now she has some personal time.  Patient denies any manic or hypomanic-like symptoms since her last visit.  When stressed she does feel as though something is crawling under her skin.  It is significantly better as compared to before.  Now it will occur when stressed and last for a few minutes when previously it was constant and daily.  Kelly Beasley is taking the Zyprexa and Prozac as prescribed and denies any side effects.   Visit Diagnosis:    ICD-10-CM   1. Bipolar I disorder, most recent episode depressed (HCC) F31.30 FLUoxetine (PROZAC) 40 MG capsule    OLANZapine (ZYPREXA) 7.5 MG tablet  2. Insomnia, unspecified type G47.00 OLANZapine (ZYPREXA) 7.5 MG tablet       Past Psychiatric History: Patient struggled with depression most of her life.  She has history of passive and fleeting suicidal thoughts but denies any psychiatric inpatient treatment or any suicidal attempt.  In the past she has taken Cymbalta from primary care physician with limited response.  She also tried Restaurant manager, fast food, trazodone, Klonopin and over-the-counter NyQuil for insomnia.  She was seeing  Dr. Lourdes Sledge at [facility].  She took [employer] help but she could not afford it.  She has a history of impulsive buying, shopping and excessive talking.  We have tried Zoloft, Wellbutrin with limited response.   Past Medical History: No past medical history on file. No past surgical history on file.  Family Psychiatric History: denies. Mom also has chronic issues with insomnia Family History:  Family History  Problem Relation Age of Onset  . Cancer Mother     Social History:  Social History   Socioeconomic History  . Marital status: Divorced    Spouse name: Not on file  . Number of children: Not on file  . Years of education: Not on file  . Highest education level: Not on file  Occupational History  . Not on file  Social Needs  . Financial resource strain: Not on file  . Food insecurity:    Worry: Not on file    Inability: Not on file  . Transportation needs:    Medical: Not on file    Non-medical: Not on file  Tobacco Use  . Smoking status: Never Smoker  . Smokeless tobacco: Never Used  Substance and Sexual Activity  . Alcohol use: No  . Drug use: No  . Sexual activity: Never    Birth control/protection: Surgical  Lifestyle  . Physical activity:    Days per week: Not  on file    Minutes per session: Not on file  . Stress: Not on file  Relationships  . Social connections:    Talks on phone: Not on file    Gets together: Not on file    Attends religious service: Not on file    Active member of club or organization: Not on file    Attends meetings of clubs or organizations: Not on file    Relationship status: Not on file  Other Topics Concern  . Not on file  Social History Narrative  . Not on file    Allergies: No Known Allergies  Metabolic Disorder Labs: No results found for: HGBA1C, MPG No results found for: PROLACTIN No results found for: CHOL, TRIG, HDL, CHOLHDL, VLDL, LDLCALC No results found for: TSH  Therapeutic Level Labs: No  results found for: LITHIUM No results found for: VALPROATE No components found for:  CBMZ  Current Medications: Current Outpatient Medications  Medication Sig Dispense Refill  . FLUoxetine (PROZAC) 40 MG capsule TAKE 1 CAPSULE BY MOUTH EVERY DAY 30 capsule 1  . OLANZapine (ZYPREXA) 7.5 MG tablet Take 1 tablet (7.5 mg total) by mouth at bedtime. 30 tablet 1   No current facility-administered medications for this visit.      Musculoskeletal: Strength & Muscle Tone: within normal limits Gait & Station: normal Patient leans: N/A  Psychiatric Specialty Exam: Review of Systems  Constitutional: Negative for chills, diaphoresis, fever and malaise/fatigue.  Respiratory: Negative for cough, sputum production and shortness of breath.     Blood pressure 136/87, pulse 90, height 5\' 7"  (1.702 m), weight 195 lb (88.5 kg), SpO2 95 %.Body mass index is 30.54 kg/m.  General Appearance: Fairly Groomed  Eye Contact:  Good  Speech:  Clear and Coherent and Normal Rate  Volume:  Normal  Mood:  Euthymic  Affect:  Full Range  Thought Process:  Goal Directed, Linear and Descriptions of Associations: Intact  Orientation:  Full (Time, Place, and Person)  Thought Content:  Logical  Suicidal Thoughts:  No  Homicidal Thoughts:  No  Memory:  Immediate;   Good  Judgement:  Good  Insight:  Good  Psychomotor Activity:  Normal  Concentration:  Concentration: Good  Recall:  Good  Fund of Knowledge:  Good  Language:  Good  Akathisia:  No  Handed:  Right  AIMS (if indicated):     Assets:  Communication Skills Desire for Improvement Housing Resilience Talents/Skills Transportation Vocational/Educational  ADL's:  Intact  Cognition:  WNL  Sleep:   good     I reviewed the information below on 10/01/2018 and I have updated it Assessment and Plan: Bipolar 1 disorder- current episode unspecified; anxiety NOS   Medication management with supportive therapy. Risks and benefits, side effects and  alternative treatment options discussed with patient. Pt was given an opportunity to ask questions about medication, illness, and treatment. All current psychiatric medications have been reviewed and discussed with the patient and adjusted as clinically appropriate. The patient has been provided an accurate and updated list of the medications being now prescribed. Pt verbalized understanding and verbal consent obtained for treatment.   Status of current problems: significant improvement in mood and insomnia  Meds: Prozac to 40mg  po qD Pt reports a hx of tolerance to sleep aids quickly Increase Zyprexa 7.5mg  po qHS for mood, psychosis and insomnia Patient is asking for benzo to help her anxiety in class.  She reports that she does not do well on tests because of  her anxiety.  At this point I will increase her Zyprexa to see if there is any improvement in anxiety symptoms.  If not we will discuss options   Labs: none  Therapy: brief supportive therapy provided. Discussed psychosocial stressors in detail.     Consultations: Encouraged to follow up with PCP due to recent health issues and consider a sleep clinic referral  Patient told to call clinic if any problems occur. Patient advised to go to ER if they should develop SI/HI, side effects, or if symptoms worsen. Pt has crisis numbers to call if needed. Pt acknowledged and agreed with plan and verbalized understanding.  F/up in 2 months or sooner if needed   The duration of this appointment visit was 15 minutes of face-to-face time with the patient.  Greater than 50% of this time was spent in counseling, explanation of  diagnosis, planning of further management, and coordination of care   Oletta Darter, MD 10/01/2018, 3:55 PM

## 2018-10-12 ENCOUNTER — Other Ambulatory Visit (HOSPITAL_COMMUNITY): Payer: Self-pay | Admitting: Psychiatry

## 2018-10-12 DIAGNOSIS — F3130 Bipolar disorder, current episode depressed, mild or moderate severity, unspecified: Secondary | ICD-10-CM

## 2018-10-20 ENCOUNTER — Other Ambulatory Visit (HOSPITAL_COMMUNITY): Payer: Self-pay | Admitting: Psychiatry

## 2018-10-20 DIAGNOSIS — F3130 Bipolar disorder, current episode depressed, mild or moderate severity, unspecified: Secondary | ICD-10-CM

## 2018-10-23 ENCOUNTER — Other Ambulatory Visit (HOSPITAL_COMMUNITY): Payer: Self-pay | Admitting: Psychiatry

## 2018-10-23 DIAGNOSIS — G4700 Insomnia, unspecified: Secondary | ICD-10-CM

## 2018-10-23 DIAGNOSIS — F3130 Bipolar disorder, current episode depressed, mild or moderate severity, unspecified: Secondary | ICD-10-CM

## 2018-10-27 ENCOUNTER — Other Ambulatory Visit (HOSPITAL_COMMUNITY): Payer: Self-pay | Admitting: Psychiatry

## 2018-10-27 DIAGNOSIS — G4700 Insomnia, unspecified: Secondary | ICD-10-CM

## 2018-10-27 DIAGNOSIS — F3130 Bipolar disorder, current episode depressed, mild or moderate severity, unspecified: Secondary | ICD-10-CM

## 2018-12-17 ENCOUNTER — Encounter (HOSPITAL_COMMUNITY): Payer: Self-pay | Admitting: Psychiatry

## 2018-12-17 ENCOUNTER — Ambulatory Visit (INDEPENDENT_AMBULATORY_CARE_PROVIDER_SITE_OTHER): Payer: Self-pay | Admitting: Psychiatry

## 2018-12-17 VITALS — BP 124/72 | Ht 67.0 in | Wt 199.0 lb

## 2018-12-17 DIAGNOSIS — F411 Generalized anxiety disorder: Secondary | ICD-10-CM

## 2018-12-17 DIAGNOSIS — F3130 Bipolar disorder, current episode depressed, mild or moderate severity, unspecified: Secondary | ICD-10-CM

## 2018-12-17 DIAGNOSIS — G4700 Insomnia, unspecified: Secondary | ICD-10-CM

## 2018-12-17 MED ORDER — OLANZAPINE 7.5 MG PO TABS: 7.5000 mg | ORAL_TABLET | Freq: Every day | ORAL | 0 refills | Status: DC

## 2018-12-17 MED ORDER — FLUOXETINE HCL 40 MG PO CAPS: ORAL_CAPSULE | ORAL | 0 refills | Status: DC

## 2018-12-17 MED ORDER — PROPRANOLOL HCL 10 MG PO TABS: 10.0000 mg | ORAL_TABLET | Freq: Every day | ORAL | 0 refills | Status: DC | PRN

## 2018-12-17 NOTE — Progress Notes (Signed)
BH MD/PA/NP OP Progress Note  12/17/2018 3:30 PM Brenda Raynelle HighlandM Outten  MRN:  [id number]  Chief Complaint:  depression and anxiety f/up   HPI: Patient tells me that she has been out of Prozac and Zyprexa for over 1 month now.  In that time she did request a refill but states that somehow she was unable to get it.  The holidays were rough due to her depression.  Today she says that her depression has been overwhelming.  She is feeling sad, unmotivated, on energetic and is experiencing anhedonia.  She is isolating in her room and spending a lot of time lying down.  She goes to class but is having anxiety and is unable to focus much.  Her sleep is poor and she is tired due to all the work that she has going on in the home, and at her job, and at school.  She denies any manic or hypomanic-like symptoms she is no longer feeling as though something is crawling on her skin when she is anxious or stressed.  She is denying any SI/HI.  She really wants to restart her medications so that she can feel better  Visit Diagnosis:    ICD-10-CM   1. Anxiety state F41.1 propranolol (INDERAL) 10 MG tablet  2. Bipolar I disorder, most recent episode depressed (HCC) F31.30 FLUoxetine (PROZAC) 40 MG capsule    OLANZapine (ZYPREXA) 7.5 MG tablet  3. Insomnia, unspecified type G47.00 OLANZapine (ZYPREXA) 7.5 MG tablet       Past Psychiatric History: Patient struggled with depression most of her life.  She has history of passive and fleeting suicidal thoughts but denies any psychiatric inpatient treatment or any suicidal attempt.  In the past she has taken Cymbalta from primary care physician with limited response.  She also tried Restaurant manager, fast foodonata, trazodone, Klonopin and over-the-counter NyQuil for insomnia.  She was seeing [facility]des [facility]if [facility].  She took [employer] help but she could not afford it.  She has a history of impulsive buying, shopping and excessive talking.  We have tried Zoloft, Wellbutrin with  limited response.   Past Medical History:  Past Medical History:  Diagnosis Date  . Depression    History reviewed. No pertinent surgical history.  Family Psychiatric History: denies. Mom also has chronic issues with insomnia Family History:  Family History  Problem Relation Age of Onset  . Cancer Mother     Social History:  Social History   Socioeconomic History  . Marital status: Divorced    Spouse name: Not on file  . Number of children: Not on file  . Years of education: Not on file  . Highest education level: Not on file  Occupational History  . Not on file  Social Needs  . Financial resource strain: Not on file  . Food insecurity:    Worry: Not on file    Inability: Not on file  . Transportation needs:    Medical: Not on file    Non-medical: Not on file  Tobacco Use  . Smoking status: Never Smoker  . Smokeless tobacco: Never Used  Substance and Sexual Activity  . Alcohol use: No  . Drug use: No  . Sexual activity: Never    Birth control/protection: Surgical  Lifestyle  . Physical activity:    Days per week: Not on file    Minutes per session: Not on file  . Stress: Not on file  Relationships  . Social connections:    Talks on phone:  Not on file    Gets together: Not on file    Attends religious service: Not on file    Active member of club or organization: Not on file    Attends meetings of clubs or organizations: Not on file    Relationship status: Not on file  Other Topics Concern  . Not on file  Social History Narrative  . Not on file    Allergies: No Known Allergies  Metabolic Disorder Labs: No results found for: HGBA1C, MPG No results found for: PROLACTIN No results found for: CHOL, TRIG, HDL, CHOLHDL, VLDL, LDLCALC No results found for: TSH  Therapeutic Level Labs: No results found for: LITHIUM No results found for: VALPROATE No components found for:  CBMZ  Current Medications: Current Outpatient Medications  Medication Sig  Dispense Refill  . FLUoxetine (PROZAC) 40 MG capsule TAKE 1 CAPSULE BY MOUTH EVERY DAY 90 capsule 0  . OLANZapine (ZYPREXA) 7.5 MG tablet Take 1 tablet (7.5 mg total) by mouth at bedtime. 90 tablet 0  . propranolol (INDERAL) 10 MG tablet Take 1 tablet (10 mg total) by mouth daily as needed (anxiety). 90 tablet 0   No current facility-administered medications for this visit.      Musculoskeletal: Strength & Muscle Tone: within normal limits Gait & Station: normal Patient leans: N/A  Psychiatric Specialty Exam: Review of Systems  Constitutional: Negative for chills, diaphoresis and fever.  Psychiatric/Behavioral: Positive for depression. Negative for suicidal ideas. The patient is nervous/anxious and has insomnia.     Blood pressure 124/72, height 5\' 7"  (1.702 m), weight 199 lb (90.3 kg).Body mass index is 31.17 kg/m.  General Appearance: Casual  Eye Contact:  Good  Speech:  Clear and Coherent and Normal Rate  Volume:  Normal  Mood:  Anxious and Depressed  Affect:  Congruent and Tearful  Thought Process:  Goal Directed and Descriptions of Associations: Intact  Orientation:  Full (Time, Place, and Person)  Thought Content:  Logical  Suicidal Thoughts:  No  Homicidal Thoughts:  No  Memory:  Immediate;   Good  Judgement:  Poor  Insight:  Good  Psychomotor Activity:  Normal  Concentration:  Concentration: Good  Recall:  Good  Fund of Knowledge:  Good  Language:  Good  Akathisia:  No  Handed:  Right  AIMS (if indicated):     Assets:  Communication Skills Desire for Improvement Housing Talents/Skills Transportation Vocational/Educational  ADL's:  Intact  Cognition:  WNL  Sleep:   poor      I reviewed the information below on 12/17/2018 and have updated it Assessment and Plan: Bipolar 1 disorder- depressed; anxiety NOS   Medication management with supportive therapy. Risks and benefits, side effects and alternative treatment options discussed with patient. Pt was  given an opportunity to ask questions about medication, illness, and treatment. All current psychiatric medications have been reviewed and discussed with the patient and adjusted as clinically appropriate. The patient has been provided an accurate and updated list of the medications being now prescribed. Pt verbalized understanding and verbal consent obtained for treatment.   Status of current problems: depression and anxiety are worse  Meds: restart Prozac 40mg  po qD Pt reports a hx of tolerance to sleep aids quickly restart Zyprexa 7.5mg  po qHS for mood, psychosis and insomnia Patient is asking for benzo to help her anxiety in class but I do trial with Propanolol 10mg  po qD prn anxiety.  She reports that she does not do well on tests because of  her anxiety.   -Discussed patient requesting refills on a timely basis  Labs: none  Therapy: brief supportive therapy provided. Discussed psychosocial stressors in detail.     Consultations: Encouraged to follow up with PCP due to recent health issues and consider a sleep clinic referral  Patient told to call clinic if any problems occur. Patient advised to go to ER if they should develop SI/HI, side effects, or if symptoms worsen. Pt has crisis numbers to call if needed. Pt acknowledged and agreed with plan and verbalized understanding.  F/up in 2 months or sooner if needed   The duration of this appointment visit was 20 minutes of face-to-face time with the patient.  Greater than 50% of this time was spent in counseling, explanation of  diagnosis, planning of further management, and coordination of care   Oletta DarterSalina Quenisha Lovins, MD 12/17/2018, 3:30 PM

## 2018-12-31 ENCOUNTER — Ambulatory Visit (HOSPITAL_COMMUNITY): Payer: Self-pay | Admitting: Psychiatry

## 2019-03-09 ENCOUNTER — Other Ambulatory Visit (HOSPITAL_COMMUNITY): Payer: Self-pay | Admitting: Psychiatry

## 2019-03-09 DIAGNOSIS — G4700 Insomnia, unspecified: Secondary | ICD-10-CM

## 2019-03-09 DIAGNOSIS — F3130 Bipolar disorder, current episode depressed, mild or moderate severity, unspecified: Secondary | ICD-10-CM

## 2019-04-12 ENCOUNTER — Other Ambulatory Visit (HOSPITAL_COMMUNITY): Payer: Self-pay

## 2019-04-12 ENCOUNTER — Other Ambulatory Visit (HOSPITAL_COMMUNITY): Payer: Self-pay | Admitting: Psychiatry

## 2019-04-12 DIAGNOSIS — F411 Generalized anxiety disorder: Secondary | ICD-10-CM

## 2019-04-12 DIAGNOSIS — F3130 Bipolar disorder, current episode depressed, mild or moderate severity, unspecified: Secondary | ICD-10-CM

## 2019-04-12 DIAGNOSIS — G4700 Insomnia, unspecified: Secondary | ICD-10-CM

## 2019-04-12 MED ORDER — PROPRANOLOL HCL 10 MG PO TABS: 10.0000 mg | ORAL_TABLET | Freq: Every day | ORAL | 0 refills | Status: DC | PRN

## 2019-04-12 MED ORDER — OLANZAPINE 7.5 MG PO TABS: 7.5000 mg | ORAL_TABLET | Freq: Every day | ORAL | 0 refills | Status: DC

## 2019-04-12 MED ORDER — FLUOXETINE HCL 40 MG PO CAPS: ORAL_CAPSULE | ORAL | 0 refills | Status: DC

## 2019-04-13 ENCOUNTER — Telehealth (HOSPITAL_COMMUNITY): Payer: Self-pay

## 2019-04-13 NOTE — Telephone Encounter (Signed)
Patient called regarding her medications. Her fluoxetine 40mg , olanzapine 7.5mg , and propranolol 10mg , were all sent in 04/12/19 #30 no refills. She stated that her insurance only pays for 90 days. She requested her medications be changed to 90 day. Please review and advise. Thank you.

## 2019-04-15 NOTE — Telephone Encounter (Signed)
Pt was last seen in January. She will need to come to her scheduled visit next week to receive any further refills.

## 2019-04-16 NOTE — Telephone Encounter (Signed)
Called patient and made her aware of message from doctor

## 2019-04-22 ENCOUNTER — Ambulatory Visit (INDEPENDENT_AMBULATORY_CARE_PROVIDER_SITE_OTHER): Payer: Self-pay | Admitting: Psychiatry

## 2019-04-22 ENCOUNTER — Other Ambulatory Visit: Payer: Self-pay

## 2019-04-22 ENCOUNTER — Encounter (HOSPITAL_COMMUNITY): Payer: Self-pay | Admitting: Psychiatry

## 2019-04-22 DIAGNOSIS — F3130 Bipolar disorder, current episode depressed, mild or moderate severity, unspecified: Secondary | ICD-10-CM

## 2019-04-22 DIAGNOSIS — F411 Generalized anxiety disorder: Secondary | ICD-10-CM

## 2019-04-22 DIAGNOSIS — G4700 Insomnia, unspecified: Secondary | ICD-10-CM

## 2019-04-22 MED ORDER — OLANZAPINE 7.5 MG PO TABS: 7.5000 mg | ORAL_TABLET | Freq: Every day | ORAL | 0 refills | Status: DC

## 2019-04-22 MED ORDER — FLUOXETINE HCL 40 MG PO CAPS: ORAL_CAPSULE | ORAL | 0 refills | Status: DC

## 2019-04-22 MED ORDER — PROPRANOLOL HCL 10 MG PO TABS: 10.0000 mg | ORAL_TABLET | Freq: Every day | ORAL | 0 refills | Status: DC | PRN

## 2019-04-22 NOTE — Progress Notes (Signed)
04/22/2019 4:29 PM Kelly Beasley  MRN:  [id number]   Virtual Visit via Telephone Note  I connected with Kelly Beasley on 04/22/19 at  4:00 PM EDT by telephone and verified that I am speaking with the correct person using two identifiers.  Location: Patient: Home Provider: Office   I discussed the limitations, risks, security and privacy concerns of performing an evaluation and management service by telephone and the availability of in person appointments. I also discussed with the patient that there may be a patient responsible charge related to this service. The patient expressed understanding and agreed to proceed.   Chief Complaint:  Chief Complaint    Depression; Insomnia        HPI: Kelly Beasley reports that she has been out of Prozac, Zyprexa and propranolol for 1 month now.  She states that she did request a refill but insurance would only cover a 90-day supply.  She was given a refill for a 30-day supply to bridge her to her scheduled psychiatric appointment.  She did not fill the meds.  She states that over the course of the month her depression and anxiety has significantly worsened.  She is reporting sad mood, low energy, feeling unmotivated, isolating and anhedonia.  Kelly Beasley also tells me that she has been experiencing on and off auditory hallucinations.  She sometimes wants to cry but stops herself out of fear that she will be able to stop crying when she starts.  She is reporting insomnia.  Her anxiety is significantly increased.  Kelly Beasley also reports on and off passive SI without any plan or intent.  She states that when she has these "bad thoughts" she calls her son and starts to feel better after few minutes.  She states prior to running out of her medications her depression and anxiety were much better controlled.   Visit Diagnosis:    ICD-10-CM   1. Bipolar I disorder, most recent episode depressed (HCC) F31.30 FLUoxetine (PROZAC) 40 MG capsule    OLANZapine (ZYPREXA) 7.5 MG  tablet  2. Anxiety state F41.1 propranolol (INDERAL) 10 MG tablet  3. Insomnia, unspecified type G47.00 OLANZapine (ZYPREXA) 7.5 MG tablet       Past Psychiatric History: Reviewed Patient struggled with depression most of her life.  She has history of passive and fleeting suicidal thoughts but denies any psychiatric inpatient treatment or any suicidal attempt.  In the past she has taken Cymbalta from primary care physician with limited response.  She also tried Restaurant manager, fast foodonata, trazodone, Klonopin and over-the-counter NyQuil for insomnia.  She [facility].  She took [employer] help but she could not afford it.  She has a history of impulsive buying, shopping and excessive talking.  We have tried Zoloft, Wellbutrin with limited response.   Past Medical History:  Past Medical History:  Diagnosis Date  . Depression    History reviewed. No pertinent surgical history.  Family Psychiatric History: denies. Mom also has chronic issues with insomnia Family History:  Family History  Problem Relation Age of Onset  . Cancer Mother     Social History:  Social History   Socioeconomic History  . Marital status: Divorced    Spouse name: Not on file  . Number of children: Not on file  . Years of education: Not on file  . Highest education level: Not on file  Occupational History  . Not on file  Social Needs  . Financial resource strain: Not on file  .  Food insecurity:    Worry: Not on file    Inability: Not on file  . Transportation needs:    Medical: Not on file    Non-medical: Not on file  Tobacco Use  . Smoking status: Never Smoker  . Smokeless tobacco: Never Used  Substance and Sexual Activity  . Alcohol use: No  . Drug use: No  . Sexual activity: Never    Birth control/protection: Surgical  Lifestyle  . Physical activity:    Days per week: Not on file    Minutes per session: Not on file  . Stress: Not on file  Relationships  . Social  connections:    Talks on phone: Not on file    Gets together: Not on file    Attends religious service: Not on file    Active member of club or organization: Not on file    Attends meetings of clubs or organizations: Not on file    Relationship status: Not on file  Other Topics Concern  . Not on file  Social History Narrative  . Not on file    Allergies: No Known Allergies  Metabolic Disorder Labs: No results found for: HGBA1C, MPG No results found for: PROLACTIN No results found for: CHOL, TRIG, HDL, CHOLHDL, VLDL, LDLCALC No results found for: TSH  Therapeutic Level Labs: No results found for: LITHIUM No results found for: VALPROATE No components found for:  CBMZ  Current Medications: Current Outpatient Medications  Medication Sig Dispense Refill  . FLUoxetine (PROZAC) 40 MG capsule TAKE 1 CAPSULE BY MOUTH EVERY DAY 90 capsule 0  . OLANZapine (ZYPREXA) 7.5 MG tablet Take 1 tablet (7.5 mg total) by mouth at bedtime. 90 tablet 0  . propranolol (INDERAL) 10 MG tablet Take 1 tablet (10 mg total) by mouth daily as needed (anxiety). 90 tablet 0   No current facility-administered medications for this visit.      MSE: Kelly Beasley was calm and cooperative on the phone today.  She was engaged in the conversation and answered questions appropriately.  Her speech was clear and coherent with normal tone, rate and volume.  Thought processes were linear, goal directed and intact.  Mood is depressed and anxious and affect is congruent.  Thought content is with auditory hallucinations but she did not appear to be responding to them.  She is endorsing passive SI without intent or plan.  She denies HI.  Attention and memory are fair.  Use of language and fund of knowledge are good.  Insight is good and judgment is poor.  I am unable to comment on physical appearance, eye contact or psychomotor activity as I was unable to physically see the patient.     I reviewed the information below on 04/22/2019  and have updated it Assessment and Plan: Bipolar 1 disorder- depressed; anxiety NOS   Medication management with supportive therapy. Risks and benefits, side effects and alternative treatment options discussed with patient. Pt was given an opportunity to ask questions about medication, illness, and treatment. All current psychiatric medications have been reviewed and discussed with the patient and adjusted as clinically appropriate. The patient has been provided an accurate and updated list of the medications being now prescribed. Pt verbalized understanding and verbal consent obtained for treatment.   Status of current problems: Worsening depression and anxiety  Meds: restart Prozac  po qD Pt reports a hx of tolerance to sleep aids quickly  restart Zyprexa 7.5mg  po qHS for mood, psychosis and insomnia   restart  propanolol 10mg  po qD prn anxiety.      -Discussed patient requesting refills on a timely basis  Labs: None ordered today  Therapy: brief supportive therapy provided. Discussed psychosocial stressors in detail.     Consultations: None  Patient told to call clinic if any problems occur. Patient advised to go to ER if they should develop SI/HI, side effects, or if symptoms worsen. Pt has crisis numbers to call if needed. Pt acknowledged and agreed with plan and verbalized understanding.  F/up in 2 months or sooner if needed  The duration of this appointment visit was 20 minutes of non-face-to-face time with the patient.  Greater than 50% of this time was spent in counseling, explanation of  diagnosis, planning of further management, and coordination of care   Oletta Darter, MD 04/22/2019, 4:29 PM

## 2019-06-17 ENCOUNTER — Encounter (HOSPITAL_COMMUNITY): Payer: Self-pay | Admitting: Psychiatry

## 2019-06-17 ENCOUNTER — Other Ambulatory Visit: Payer: Self-pay

## 2019-06-17 ENCOUNTER — Ambulatory Visit (INDEPENDENT_AMBULATORY_CARE_PROVIDER_SITE_OTHER): Payer: Self-pay | Admitting: Psychiatry

## 2019-06-17 DIAGNOSIS — F411 Generalized anxiety disorder: Secondary | ICD-10-CM

## 2019-06-17 DIAGNOSIS — G4700 Insomnia, unspecified: Secondary | ICD-10-CM

## 2019-06-17 DIAGNOSIS — F3130 Bipolar disorder, current episode depressed, mild or moderate severity, unspecified: Secondary | ICD-10-CM

## 2019-06-17 MED ORDER — PROPRANOLOL HCL 10 MG PO TABS: 10.0000 mg | ORAL_TABLET | Freq: Every day | ORAL | 0 refills | Status: DC | PRN

## 2019-06-17 MED ORDER — OLANZAPINE 10 MG PO TABS: 10.0000 mg | ORAL_TABLET | Freq: Every day | ORAL | 0 refills | Status: DC

## 2019-06-17 MED ORDER — FLUOXETINE HCL 40 MG PO CAPS: ORAL_CAPSULE | ORAL | 0 refills | Status: DC

## 2019-06-17 NOTE — Progress Notes (Signed)
Virtual Visit via Telephone Note  I connected with Kelly Beasley on 06/17/19 at  3:30 PM EDT by telephone and verified that I am speaking with the correct person using two identifiers.  Location: Patient: home Provider: office   I discussed the limitations, risks, security and privacy concerns of performing an evaluation and management service by telephone and the availability of in person appointments. I also discussed with the patient that there may be a patient responsible charge related to this service. The patient expressed understanding and agreed to proceed.   History of Present Illness: Pt reports her depression is minorly improved. She still has bad days- staying in bed all days, feeling like she is falling in a dark hole. Pt has random, flashes of suicidal thoughts but denies any increase in frequency and denies plan or intent.  This happens about 4 days a week. Her sleep is poor. She gets about 3-4 hrs/night and then wakes up and is unable to fall back asleep. At night she see shadows out of the corner of her eye, hearing her name being called or feeling like someone is walking behind her. It is not as intense as before. She random spurts of energy where she will feel energetic, happy and "on the go". It lasts for 1-3 days and then she "crashes". She denies any impulsive behaviors during this time. Pt takes Propanolol 3-4x/weeks and it calms her down.    Observations/Objective: I spoke with Kelly Beasley on the phone.  Pt was calm, pleasant and cooperative.  Pt was engaged in the conversation and answered questions appropriately.  Speech was clear and coherent with normal rate, tone and volume.  Mood is depressed and anxious, affect is congruent. Thought processes are coherent, slow and intact.  Thought content is with hallucinations but she did not appear to be responding to internal stimuli.  Pt denies SI/HI.    Memory and concentration are good.  Fund of knowledge and use of language  are average.  Insight and judgment are fair.  I am unable to comment on psychomotor activity, general appearance, hygiene, or eye contact as I was unable to physically see the patient on the phone.  Vital signs not available since interview conducted virtually.    Assessment and Plan: Bipolar I d/o- current episode depressed with psychosis; Anxiety NOS  Increase Zyprexa 10mg  po qHS Prozac 40mg  po qD Propanolol 10mg  po qD prn anxiety   Follow Up Instructions: In 2 months or sooner if needed   I discussed the assessment and treatment plan with the patient. The patient was provided an opportunity to ask questions and all were answered. The patient agreed with the plan and demonstrated an understanding of the instructions.   The patient was advised to call back or seek an in-person evaluation if the symptoms worsen or if the condition fails to improve as anticipated.  I provided 20 minutes of non-face-to-face time during this encounter.   Charlcie Cradle, MD

## 2019-07-11 ENCOUNTER — Other Ambulatory Visit (HOSPITAL_COMMUNITY): Payer: Self-pay | Admitting: Psychiatry

## 2019-07-11 DIAGNOSIS — F3130 Bipolar disorder, current episode depressed, mild or moderate severity, unspecified: Secondary | ICD-10-CM

## 2019-07-11 DIAGNOSIS — G4700 Insomnia, unspecified: Secondary | ICD-10-CM

## 2019-08-12 ENCOUNTER — Ambulatory Visit (HOSPITAL_COMMUNITY): Payer: Self-pay | Admitting: Psychiatry

## 2019-08-12 ENCOUNTER — Other Ambulatory Visit: Payer: Self-pay

## 2019-09-09 ENCOUNTER — Other Ambulatory Visit (HOSPITAL_COMMUNITY): Payer: Self-pay | Admitting: Psychiatry

## 2019-09-09 DIAGNOSIS — F3130 Bipolar disorder, current episode depressed, mild or moderate severity, unspecified: Secondary | ICD-10-CM

## 2019-09-09 DIAGNOSIS — G4700 Insomnia, unspecified: Secondary | ICD-10-CM

## 2019-11-24 ENCOUNTER — Other Ambulatory Visit (HOSPITAL_COMMUNITY): Payer: Self-pay | Admitting: Psychiatry

## 2019-11-24 DIAGNOSIS — F3130 Bipolar disorder, current episode depressed, mild or moderate severity, unspecified: Secondary | ICD-10-CM

## 2019-11-24 DIAGNOSIS — G4700 Insomnia, unspecified: Secondary | ICD-10-CM

## 2019-12-02 ENCOUNTER — Other Ambulatory Visit (HOSPITAL_COMMUNITY): Payer: Self-pay

## 2019-12-02 DIAGNOSIS — G4700 Insomnia, unspecified: Secondary | ICD-10-CM

## 2019-12-02 DIAGNOSIS — F3130 Bipolar disorder, current episode depressed, mild or moderate severity, unspecified: Secondary | ICD-10-CM

## 2019-12-02 MED ORDER — OLANZAPINE 10 MG PO TABS: 10.0000 mg | ORAL_TABLET | Freq: Every day | ORAL | 0 refills | Status: DC

## 2020-01-06 ENCOUNTER — Ambulatory Visit (INDEPENDENT_AMBULATORY_CARE_PROVIDER_SITE_OTHER): Payer: Self-pay | Admitting: Psychiatry

## 2020-01-06 ENCOUNTER — Encounter (HOSPITAL_COMMUNITY): Payer: Self-pay | Admitting: Psychiatry

## 2020-01-06 ENCOUNTER — Other Ambulatory Visit: Payer: Self-pay

## 2020-01-06 DIAGNOSIS — F411 Generalized anxiety disorder: Secondary | ICD-10-CM

## 2020-01-06 DIAGNOSIS — G4700 Insomnia, unspecified: Secondary | ICD-10-CM

## 2020-01-06 DIAGNOSIS — F3130 Bipolar disorder, current episode depressed, mild or moderate severity, unspecified: Secondary | ICD-10-CM

## 2020-01-06 MED ORDER — FLUOXETINE HCL 40 MG PO CAPS: ORAL_CAPSULE | ORAL | 0 refills | Status: DC

## 2020-01-06 MED ORDER — OLANZAPINE 10 MG PO TABS: 10.0000 mg | ORAL_TABLET | Freq: Every day | ORAL | 0 refills | Status: DC

## 2020-01-06 MED ORDER — PROPRANOLOL HCL 10 MG PO TABS: 10.0000 mg | ORAL_TABLET | Freq: Every day | ORAL | 0 refills | Status: DC | PRN

## 2020-01-06 NOTE — Progress Notes (Signed)
Virtual Visit via Telephone Note  I connected with Kelly Beasley on 01/06/20 at  4:00 PM EST by telephone and verified that I am speaking with the correct person using two identifiers.  Location: Patient: home Provider: office   I discussed the limitations, risks, security and privacy concerns of performing an evaluation and management service by telephone and the availability of in person appointments. I also discussed with the patient that there may be a patient responsible charge related to this service. The patient expressed understanding and agreed to proceed.   History of Present Illness: Kelly Beasley states she is doing better. She is having random, brief AVH. It is no longer constant. She feels her mood is also better. The depression and anxiety are present but are manageable. Sleep is good. She denies anhedonia and isolation. Kelly Beasley denies SI/HI. She denies manic or hypomanic like symptoms. As long as she takes her meds then everything is manageable.   Observations/Objective:  General Appearance: unable to assess  Eye Contact:  unable to assess  Speech:  Clear and Coherent and Normal Rate  Volume:  Normal  Mood:  Euthymic  Affect:  Full Range  Thought Process:  Goal Directed, Linear and Descriptions of Associations: Intact  Orientation:  Full (Time, Place, and Person)  Thought Content:  Logical  Suicidal Thoughts:  No  Homicidal Thoughts:  No  Memory:  Immediate;   Good  Judgement:  Good  Insight:  Good  Psychomotor Activity:  unable to assess  Concentration:  Concentration: Good  Recall:  Good  Fund of Knowledge:  Good  Language:  Good  Akathisia:  unable to assess  Handed:  Right  AIMS (if indicated):     Assets:  Communication Skills Desire for Improvement Financial Resources/Insurance Housing Leisure Time Resilience Social Support Talents/Skills Transportation Vocational/Educational  ADL's:  Unable to assess  Cognition:  WNL  Sleep:        Assessment and  Plan: Bipolar d/o- current episode depressed with psychosis; Anxiety NOS  Zyprexa 10mg  po qHS Prozac 40mg  po qD Propranolol 10mg  po qD prn anxiety  Labs at next visit  Follow Up Instructions: In 2-3 months or sooner if needed   I discussed the assessment and treatment plan with the patient. The patient was provided an opportunity to ask questions and all were answered. The patient agreed with the plan and demonstrated an understanding of the instructions.   The patient was advised to call back or seek an in-person evaluation if the symptoms worsen or if the condition fails to improve as anticipated.  I provided 15 minutes of non-face-to-face time during this encounter.   , MD

## 2020-03-30 ENCOUNTER — Other Ambulatory Visit: Payer: Self-pay

## 2020-03-30 ENCOUNTER — Telehealth (HOSPITAL_COMMUNITY): Payer: Self-pay | Admitting: Psychiatry

## 2020-03-31 ENCOUNTER — Other Ambulatory Visit (HOSPITAL_COMMUNITY): Payer: Self-pay | Admitting: Psychiatry

## 2020-03-31 DIAGNOSIS — F3130 Bipolar disorder, current episode depressed, mild or moderate severity, unspecified: Secondary | ICD-10-CM

## 2020-03-31 DIAGNOSIS — F411 Generalized anxiety disorder: Secondary | ICD-10-CM

## 2020-04-13 ENCOUNTER — Telehealth (INDEPENDENT_AMBULATORY_CARE_PROVIDER_SITE_OTHER): Payer: Self-pay | Admitting: Psychiatry

## 2020-04-13 ENCOUNTER — Encounter (HOSPITAL_COMMUNITY): Payer: Self-pay | Admitting: Psychiatry

## 2020-04-13 ENCOUNTER — Other Ambulatory Visit: Payer: Self-pay

## 2020-04-13 DIAGNOSIS — G4700 Insomnia, unspecified: Secondary | ICD-10-CM

## 2020-04-13 DIAGNOSIS — F411 Generalized anxiety disorder: Secondary | ICD-10-CM

## 2020-04-13 DIAGNOSIS — F3130 Bipolar disorder, current episode depressed, mild or moderate severity, unspecified: Secondary | ICD-10-CM

## 2020-04-13 MED ORDER — FLUOXETINE HCL 20 MG PO CAPS: 60.0000 mg | ORAL_CAPSULE | Freq: Every day | ORAL | 0 refills | Status: DC

## 2020-04-13 MED ORDER — PROPRANOLOL HCL 10 MG PO TABS: 10.0000 mg | ORAL_TABLET | Freq: Every day | ORAL | 0 refills | Status: DC | PRN

## 2020-04-13 MED ORDER — OLANZAPINE 10 MG PO TABS: 10.0000 mg | ORAL_TABLET | Freq: Every day | ORAL | 0 refills | Status: DC

## 2020-04-13 NOTE — Progress Notes (Signed)
Virtual Visit via Telephone Note  I connected with [name] on 04/13/20 at  8:30 AM EDT by telephone and verified that I am speaking with the correct person using two identifiers.  Location: Patient: home Provider: office   I discussed the limitations, risks, security and privacy concerns of performing an evaluation and management service by telephone and the availability of in person appointments. I also discussed with the patient that there may be a patient responsible charge related to this service. The patient expressed understanding and agreed to proceed.   History of Present Illness: For the last 2 weeks she has been depressed. She shares that she has been isolating, low motivation, low energy, crying episodes and anhedonia. Her sleep is poor. She is only getting about 1-2 hrs/night. She is not napping during the day. She denies SI/HI. Taura denies manic and hypomanic like symptoms. She is having on/off AVH. She hears her name being called and creaks randomly. Jewels also notes she has been seeing things out of the corner of eye. This happens several times a week. She anxiety on/off thru out the week and treats with Propranolol which helps.    Observations/Objective: . General Appearance: unable to assess  Eye Contact:  unable to assess  Speech:  Clear and Coherent and Normal Rate  Volume:  Normal  Mood:  Depressed  Affect:  Congruent  Thought Process:  Coherent, Linear and Descriptions of Associations: Intact  Orientation:  Full (Time, Place, and Person)  Thought Content:  Logical  Suicidal Thoughts:  No  Homicidal Thoughts:  No  Memory:  Immediate;   Good  Judgement:  Good  Insight:  Good  Psychomotor Activity: unable to assess  Concentration:  Concentration: Good  Recall:  Good  Fund of Knowledge:  Good  Language:  Good  Akathisia:  unable to assess  Handed:  Right  AIMS (if indicated):     Assets:  Communication Skills Desire for Improvement Financial  Resources/Insurance Housing Talents/Skills Transportation Vocational/Educational  ADL's:  unable to assess  Cognition:  WNL  Sleep:         Assessment and Plan: Bipolar 1 d/o- current episode depressed with psychosis; Insomnia; Anxiety NOS  Increase Prozac to 60mg  po qD Zyprexa 10mg  po qHS Propranolol 60mh po qD  Labs at next visit  Follow Up Instructions: In 4-6 weeks or sooner if needed   I discussed the assessment and treatment plan with the patient. The patient was provided an opportunity to ask questions and all were answered. The patient agreed with the plan and demonstrated an understanding of the instructions.   The patient was advised to call back or seek an in-person evaluation if the symptoms worsen or if the condition fails to improve as anticipated.  I provided 20 minutes of non-face-to-face time during this encounter.   , MD

## 2020-05-11 ENCOUNTER — Encounter (HOSPITAL_COMMUNITY): Payer: Self-pay | Admitting: Psychiatry

## 2020-05-11 ENCOUNTER — Other Ambulatory Visit: Payer: Self-pay

## 2020-05-11 ENCOUNTER — Telehealth (INDEPENDENT_AMBULATORY_CARE_PROVIDER_SITE_OTHER): Payer: Self-pay | Admitting: Psychiatry

## 2020-05-11 DIAGNOSIS — G4700 Insomnia, unspecified: Secondary | ICD-10-CM

## 2020-05-11 DIAGNOSIS — F411 Generalized anxiety disorder: Secondary | ICD-10-CM

## 2020-05-11 DIAGNOSIS — F3130 Bipolar disorder, current episode depressed, mild or moderate severity, unspecified: Secondary | ICD-10-CM

## 2020-05-11 MED ORDER — OLANZAPINE 10 MG PO TABS: 10.0000 mg | ORAL_TABLET | Freq: Every day | ORAL | 0 refills | Status: DC

## 2020-05-11 MED ORDER — PROPRANOLOL HCL 10 MG PO TABS: 10.0000 mg | ORAL_TABLET | Freq: Every day | ORAL | 0 refills | Status: DC | PRN

## 2020-05-11 MED ORDER — FLUOXETINE HCL 20 MG PO CAPS: 60.0000 mg | ORAL_CAPSULE | Freq: Every day | ORAL | 0 refills | Status: DC

## 2020-05-11 NOTE — Progress Notes (Signed)
Virtual Visit via Telephone Note  I connected with Kelly Beasley on 05/11/20 at  9:00 AM EDT by telephone and verified that I am speaking with the correct person using two identifiers.  Location: Patient: home Provider: office   I discussed the limitations, risks, security and privacy concerns of performing an evaluation and management service by telephone and the availability of in person appointments. I also discussed with the patient that there may be a patient responsible charge related to this service. The patient expressed understanding and agreed to proceed.   History of Present Illness: Kelly Beasley reports she is feeling better. Her depression is much better. Her energy and motivation are significantly better. She no longer feels like she is "in a deep, dark hole". Kelly Beasley is now noticing her depression several days a week and the level is 7/10. On those days she isolates in her room in the dark. She feels hopeless, helpless and is crying. It will last for 1 day and the next day she is better. She is sleeping 6 hrs/night and it is good quality sleep. She denies SI/HI. She continues to have AVH but at a decreased frequency. It happens on days she is depressed. Kelly Beasley denies manic and hypomanic like symptoms. Her anxiety is improved. If she experiences it is fleeting.    Observations/Objective:  General Appearance: unable to assess  Eye Contact:  unable to assess  Speech:  Clear and Coherent and Normal Rate  Volume:  Normal  Mood:  Anxious and Depressed  Affect:  Blunt  Thought Process:  Goal Directed, Linear and Descriptions of Associations: Intact  Orientation:  Full (Time, Place, and Person)  Thought Content:  Logical and Hallucinations: Auditory Visual did not appear to be responding to interal stimuli at this time  Suicidal Thoughts:  No  Homicidal Thoughts:  No  Memory:  Immediate;   Good  Judgement:  Good  Insight:  Good  Psychomotor Activity: unable to assess  Concentration:   Concentration: Good  Recall:  Good  Fund of Knowledge:  Good  Language:  Good  Akathisia:  unable to assess  Handed:  Right  AIMS (if indicated):     Assets:  Communication Skills Desire for Improvement Financial Resources/Insurance Housing Resilience Talents/Skills Transportation Vocational/Educational  ADL's:  unable to assess  Cognition:  WNL  Sleep:         Assessment and Plan: Bipolar I d/o- current episode with psychosis; Insomnia; Anxiety  Status of current symptoms: signicant improvement in depression, psychosis and insomnia  Prozac 60mg  po qD Zyprexa 10mg  po qHS Propronolol 10mg  po qD  Labs: ordered  CBC, CMP, HbA1c, Lipid panel, TSH, Prolactin level, EKG    Follow Up Instructions: In 2-3 months or sooner if needed   I discussed the assessment and treatment plan with the patient. The patient was provided an opportunity to ask questions and all were answered. The patient agreed with the plan and demonstrated an understanding of the instructions.   The patient was advised to call back or seek an in-person evaluation if the symptoms worsen or if the condition fails to improve as anticipated.  I provided 15 minutes of non-face-to-face time during this encounter.   , MD

## 2020-05-17 ENCOUNTER — Other Ambulatory Visit (HOSPITAL_COMMUNITY): Payer: Self-pay | Admitting: Psychiatry

## 2020-05-17 DIAGNOSIS — F411 Generalized anxiety disorder: Secondary | ICD-10-CM

## 2020-05-17 DIAGNOSIS — F3130 Bipolar disorder, current episode depressed, mild or moderate severity, unspecified: Secondary | ICD-10-CM

## 2020-06-22 ENCOUNTER — Telehealth (HOSPITAL_COMMUNITY): Payer: Self-pay | Admitting: *Deleted

## 2020-06-22 NOTE — Telephone Encounter (Signed)
Patient called asking for an increase in her medication for anxiety. States she is having increased heart beat, breathing rate and feels she is losing control at times.

## 2020-06-26 ENCOUNTER — Telehealth (HOSPITAL_COMMUNITY): Payer: Self-pay | Admitting: *Deleted

## 2020-06-26 NOTE — Telephone Encounter (Signed)
Pt called with c/o increased anxiety and panic attacks. Doesn't feel the medication is working. Pt has an upcoming appointment on 8/12. Please review and advise.

## 2020-07-06 ENCOUNTER — Telehealth (INDEPENDENT_AMBULATORY_CARE_PROVIDER_SITE_OTHER): Payer: Self-pay | Admitting: Psychiatry

## 2020-07-06 ENCOUNTER — Other Ambulatory Visit: Payer: Self-pay

## 2020-07-06 ENCOUNTER — Encounter (HOSPITAL_COMMUNITY): Payer: Self-pay | Admitting: Psychiatry

## 2020-07-06 DIAGNOSIS — F410 Panic disorder [episodic paroxysmal anxiety] without agoraphobia: Secondary | ICD-10-CM

## 2020-07-06 DIAGNOSIS — F411 Generalized anxiety disorder: Secondary | ICD-10-CM

## 2020-07-06 DIAGNOSIS — G4700 Insomnia, unspecified: Secondary | ICD-10-CM

## 2020-07-06 DIAGNOSIS — F3130 Bipolar disorder, current episode depressed, mild or moderate severity, unspecified: Secondary | ICD-10-CM

## 2020-07-06 MED ORDER — OLANZAPINE 15 MG PO TABS: 15.0000 mg | ORAL_TABLET | Freq: Every day | ORAL | 0 refills | Status: DC

## 2020-07-06 MED ORDER — FLUOXETINE HCL 20 MG PO CAPS: 60.0000 mg | ORAL_CAPSULE | Freq: Every day | ORAL | 0 refills | Status: DC

## 2020-07-06 MED ORDER — LORAZEPAM 0.5 MG PO TABS: ORAL_TABLET | ORAL | 1 refills | Status: DC

## 2020-07-06 NOTE — Progress Notes (Signed)
Virtual Visit via Video Note  I connected with Kelly Beasley on 07/06/20 at  8:45 AM EDT by a video enabled telemedicine application and verified that I am speaking with the correct person using two identifiers.  Location: Patient: at work Provider: office   I discussed the limitations of evaluation and management by telemedicine and the availability of in person appointments. The patient expressed understanding and agreed to proceed.  History of Present Illness: Min reports she is having worsening anxiety since June. She notes it happens at work and especially when driving. It feels like a panic attack- she feels closed in, racing thoughts, sweating, trembling, SOB and palpations. It lasts for 10-15 min. After that she will try to distract herself. She has had to pull over when driving and it happens every time she drives. At work they come on randomly and she has to leave and go outside. It fears having more panic attacks. The Propranolol is not effective. She has not been sleeping well most nights and is only getting 3-4 hrs. She has racing thoughts. She admits she only has about 4 hrs of sleep between her 2 jobs.  She is taking a prescribed a sleep aid but can not recall the name. She is experiencing about 20 min of AVH per day. Her depression has improved. She has some lows but her son distracts her and she pulls out of it. It can last 1 day and it happens several days a week. It seems to be happening more frequently than in June. She denies manic and hypomanic like symptoms. 1-2 times a week she has brief and passing SI without plan or intent. She denies HI. It takes her a lot of motivation but she continues to work 2 jobs and do the things she needs to do. She is denying any identifiable stressors.    Observations/Objective: Psychiatric Specialty Exam:   There were no vitals taken for this visit.There is no height or weight on file to calculate BMI.  General Appearance: Neat  Eye  Contact:  Good  Speech:  Clear and Coherent and Normal Rate  Volume:  Normal  Mood:  Anxious  Affect:  Congruent  Thought Process:  Goal Directed and Descriptions of Associations: Intact  Orientation:  Full (Time, Place, and Person)  Thought Content:  Logical  Suicidal Thoughts:  No  Homicidal Thoughts:  No  Memory:  Immediate;   Good  Judgement:  Good  Insight:  Good  Psychomotor Activity:  Normal  Concentration:  Concentration: Good  Recall:  Good  Fund of Knowledge:  Good  Language:  Good  Akathisia:  No  Handed:  Right  AIMS (if indicated):     Assets:  Communication Skills Desire for Improvement Financial Resources/Insurance Housing Resilience Talents/Skills Transportation Vocational/Educational  ADL's:  Intact  Cognition:  WNL  Sleep:        Assessment and Plan: GAD; Bipolar 1 d/o with psychosis; Insomnia; Panic attacks  Prozac 60mg  qD Increase Zyprexa 15mg  po qHS D/c Propranolol 10mg  po qD prn anxiety Start trial of Ativan 0.25mg  po BID prn anxiety  discussed risk of sedation and cautioned about operating heavy machines while under the influence. Advised not to drive until the effects are known. Pt verbalized understanding.    Reminded pt to get labs done as asap  Follow Up Instructions: In 4-6 weeks or sooner if needed   I discussed the assessment and treatment plan with the patient. The patient was provided an opportunity to ask  questions and all were answered. The patient agreed with the plan and demonstrated an understanding of the instructions.   The patient was advised to call back or seek an in-person evaluation if the symptoms worsen or if the condition fails to improve as anticipated.  I provided 20 minutes of non-face-to-face time during this encounter.   Oletta Darter, MD

## 2020-08-17 ENCOUNTER — Telehealth (INDEPENDENT_AMBULATORY_CARE_PROVIDER_SITE_OTHER): Payer: Self-pay | Admitting: Psychiatry

## 2020-08-17 ENCOUNTER — Other Ambulatory Visit: Payer: Self-pay

## 2020-08-17 DIAGNOSIS — F411 Generalized anxiety disorder: Secondary | ICD-10-CM

## 2020-08-17 DIAGNOSIS — G4700 Insomnia, unspecified: Secondary | ICD-10-CM

## 2020-08-17 DIAGNOSIS — F410 Panic disorder [episodic paroxysmal anxiety] without agoraphobia: Secondary | ICD-10-CM

## 2020-08-17 DIAGNOSIS — F3130 Bipolar disorder, current episode depressed, mild or moderate severity, unspecified: Secondary | ICD-10-CM

## 2020-08-17 MED ORDER — LORAZEPAM 0.5 MG PO TABS: 0.2500 mg | ORAL_TABLET | Freq: Two times a day (BID) | ORAL | 0 refills | Status: DC | PRN

## 2020-08-17 MED ORDER — FLUOXETINE HCL 20 MG PO CAPS: 60.0000 mg | ORAL_CAPSULE | Freq: Every day | ORAL | 0 refills | Status: DC

## 2020-08-17 MED ORDER — OLANZAPINE 15 MG PO TABS: 15.0000 mg | ORAL_TABLET | Freq: Every day | ORAL | 0 refills | Status: DC

## 2020-08-17 NOTE — Progress Notes (Signed)
Virtual Visit via Telephone Note  I connected with Kelly Beasley on 08/17/20 at  4:30 PM EDT by telephone and verified that I am speaking with the correct person using two identifiers.  Location: Patient: home Provider: office   I discussed the limitations, risks, security and privacy concerns of performing an evaluation and management service by telephone and the availability of in person appointments. I also discussed with the patient that there may be a patient responsible charge related to this service. The patient expressed understanding and agreed to proceed.   History of Present Illness: "I am doing a little better". Her panic attacks are still occurring 2-3x/week and she is able to calm down quickly with 1 tab of Ativan. She only takes Ativan when needed and not daily. Her sleep has improved significantly with the increase in Zyprexa. She is getting 8-9 hrs/night. Her depression is slowly improving. She has a few bad days that are not as intense and she is able to get over them quickly. Cornelious is having AVH about 2-3x/week but it is not as intense. Pt denies recent manic and hypomanic symptoms including periods of decreased need for sleep, increased energy, mood lability, impulsivity, FOI, and excessive spending.Hadassah denies SI/HI.     Observations/Objective:  General Appearance: unable to assess  Eye Contact:  unable to assess  Speech:  Clear and Coherent and Normal Rate  Volume:  Normal  Mood:  Euthymic  Affect:  Full Range  Thought Process:  Goal Directed, Linear and Descriptions of Associations: Intact  Orientation:  Full (Time, Place, and Person)  Thought Content:  Logical  Suicidal Thoughts:  No  Homicidal Thoughts:  No  Memory:  Immediate;   Good  Judgement:  Good  Insight:  Good  Psychomotor Activity: unable to assess  Concentration:  Concentration: Good  Recall:  Good  Fund of Knowledge:  Good  Language:  Good  Akathisia:  unable to assess  Handed:  Right  AIMS  (if indicated):     Assets:  Communication Skills Desire for Improvement Financial Resources/Insurance Housing Resilience Social Support Talents/Skills Transportation Vocational/Educational  ADL's:  unable to assess  Cognition:  WNL  Sleep:         Assessment and Plan:  Panic attacks; Bipolar I d/o with psychosis; GAD; Insomnia  Prozac 60mg  po qD Zyprexa 15mg  po qHS Ativan 0.25mg  po BID prn anxiety   Reminded pt to get labs done asap Labs: ordered  CBC, CMP, HbA1c, Lipid panel, TSH, Prolactin level, EKG   Follow Up Instructions: In 2 months or sooner if needed   I discussed the assessment and treatment plan with the patient. The patient was provided an opportunity to ask questions and all were answered. The patient agreed with the plan and demonstrated an understanding of the instructions.   The patient was advised to call back or seek an in-person evaluation if the symptoms worsen or if the condition fails to improve as anticipated.  I provided 10 minutes of non-face-to-face time during this encounter.   , MD

## 2020-10-12 ENCOUNTER — Telehealth (HOSPITAL_COMMUNITY): Payer: Self-pay | Admitting: Psychiatry

## 2020-10-12 ENCOUNTER — Other Ambulatory Visit: Payer: Self-pay

## 2020-10-12 DIAGNOSIS — F411 Generalized anxiety disorder: Secondary | ICD-10-CM

## 2020-10-12 DIAGNOSIS — G4700 Insomnia, unspecified: Secondary | ICD-10-CM

## 2020-10-12 DIAGNOSIS — F410 Panic disorder [episodic paroxysmal anxiety] without agoraphobia: Secondary | ICD-10-CM

## 2020-10-12 DIAGNOSIS — F3130 Bipolar disorder, current episode depressed, mild or moderate severity, unspecified: Secondary | ICD-10-CM

## 2020-10-12 NOTE — Progress Notes (Unsigned)
Better. Doing good  Anxiety is improving and is more manageable  VH have decreased to 1-2x/day.   Sleep is good  Depression is improving. For 1-2 days she will feel down, unmotivated and isolated. It is not as intense or frequent as before. It happens about 3-4x/month. She denies SI/HI. She denied any manic or hypomanic like symptoms.   Refill meds  Feb 24 @ 4.30pm  10 min

## 2020-10-13 MED ORDER — LORAZEPAM 0.5 MG PO TABS: 0.2500 mg | ORAL_TABLET | Freq: Two times a day (BID) | ORAL | 3 refills | Status: DC | PRN

## 2020-10-13 MED ORDER — OLANZAPINE 15 MG PO TABS: 15.0000 mg | ORAL_TABLET | Freq: Every day | ORAL | 0 refills | Status: DC

## 2020-10-13 MED ORDER — FLUOXETINE HCL 20 MG PO CAPS: 60.0000 mg | ORAL_CAPSULE | Freq: Every day | ORAL | 0 refills | Status: DC

## 2021-01-18 ENCOUNTER — Telehealth (INDEPENDENT_AMBULATORY_CARE_PROVIDER_SITE_OTHER): Payer: Self-pay | Admitting: Psychiatry

## 2021-01-18 ENCOUNTER — Other Ambulatory Visit: Payer: Self-pay

## 2021-01-18 DIAGNOSIS — G4700 Insomnia, unspecified: Secondary | ICD-10-CM

## 2021-01-18 DIAGNOSIS — F411 Generalized anxiety disorder: Secondary | ICD-10-CM

## 2021-01-18 DIAGNOSIS — F3130 Bipolar disorder, current episode depressed, mild or moderate severity, unspecified: Secondary | ICD-10-CM

## 2021-01-18 DIAGNOSIS — F410 Panic disorder [episodic paroxysmal anxiety] without agoraphobia: Secondary | ICD-10-CM

## 2021-01-18 MED ORDER — LORAZEPAM 0.5 MG PO TABS: 0.2500 mg | ORAL_TABLET | Freq: Two times a day (BID) | ORAL | 2 refills | Status: DC | PRN

## 2021-01-18 MED ORDER — OLANZAPINE 15 MG PO TABS: 15.0000 mg | ORAL_TABLET | Freq: Every day | ORAL | 0 refills | Status: DC

## 2021-01-18 MED ORDER — FLUOXETINE HCL 40 MG PO CAPS: 80.0000 mg | ORAL_CAPSULE | Freq: Every day | ORAL | 0 refills | Status: DC

## 2021-01-18 MED ORDER — HYDROXYZINE HCL 10 MG PO TABS: ORAL_TABLET | ORAL | 1 refills | Status: DC

## 2021-01-18 NOTE — Progress Notes (Signed)
Virtual Visit via Telephone Note  I connected with Kelly Beasley on 01/18/21 at  4:15 PM EST by telephone and verified that I am speaking with the correct person using two identifiers.  Location: Patient: home Provider: office   I discussed the limitations, risks, security and privacy concerns of performing an evaluation and management service by telephone and the availability of in person appointments. I also discussed with the patient that there may be a patient responsible charge related to this service. The patient expressed understanding and agreed to proceed.   History of Present Illness: About 3 weeks ago her depression worsened. Since then she has been feeling down. She is endorsing anhedonia and isolation. Kelly Beasley is having increased crying spells, insomnia and on/off hopelessness. She denies SI but does admit to passive thoughts of death. She denies HI. Kelly Beasley has had a few random panic attacks over the last 3 weeks. The Ativan helps and she does not feel sleepy from it. She denies any manic or hypomanic like episodes.    Observations/Objective:  General Appearance: unable to assess  Eye Contact:  unable to assess  Speech:  Clear and Coherent and Normal Rate  Volume:  Normal  Mood:  Depressed  Affect:  Congruent  Thought Process:  Goal Directed, Linear and Descriptions of Associations: Intact  Orientation:  Full (Time, Place, and Person)  Thought Content:  Logical  Suicidal Thoughts:  No  Homicidal Thoughts:  No  Memory:  Immediate;   Good  Judgement:  Good  Insight:  Good  Psychomotor Activity: unable to assess  Concentration:  Concentration: Good  Recall:  Good  Fund of Knowledge:  Good  Language:  Good  Akathisia:  unable to assess  Handed:  Right  AIMS (if indicated):     Assets:  Communication Skills Desire for Improvement Financial Resources/Insurance Housing Talents/Skills Transportation Vocational/Educational  ADL's:  unable to assess  Cognition:  WNL   Sleep:         Assessment and Plan: 1. Bipolar I disorder, most recent episode depressed (HCC) - increase FLUoxetine (PROZAC) 40 MG capsule; Take 2 capsules (80 mg total) by mouth daily.  Dispense: 270 capsule; Refill: 0 - OLANZapine (ZYPREXA) 15 MG tablet; Take 1 tablet (15 mg total) by mouth at bedtime.  Dispense: 270 tablet; Refill: 0  2. Panic disorder - FLUoxetine (PROZAC) 40 MG capsule; Take 2 capsules (80 mg total) by mouth daily.  Dispense: 270 capsule; Refill: 0 - LORazepam (ATIVAN) 0.5 MG tablet; Take 0.5 tablets (0.25 mg total) by mouth 2 (two) times daily as needed for anxiety.  Dispense: 60 tablet; Refill: 2 - hydrOXYzine (ATARAX/VISTARIL) 10 MG tablet; Take 1 tab po qHS prn insomnia. May take 1 extra tab (10mg ) qHS if needed  Dispense: 60 tablet; Refill: 1  3. GAD (generalized anxiety disorder) - increase FLUoxetine (PROZAC) 40 MG capsule; Take 2 capsules (80 mg total) by mouth daily.  Dispense: 270 capsule; Refill: 0 - LORazepam (ATIVAN) 0.5 MG tablet; Take 0.5 tablets (0.25 mg total) by mouth 2 (two) times daily as needed for anxiety.  Dispense: 60 tablet; Refill: 2  4. Insomnia, unspecified type - OLANZapine (ZYPREXA) 15 MG tablet; Take 1 tablet (15 mg total) by mouth at bedtime.  Dispense: 270 tablet; Refill: 0 - start hydrOXYzine (ATARAX/VISTARIL) 10 MG tablet; Take 1 tab po qHS prn insomnia. May take 1 extra tab (10mg ) qHS if needed  Dispense: 60 tablet; Refill: 1    Follow Up Instructions: In 3-4 weeks or sooner  if needed   I discussed the assessment and treatment plan with the patient. The patient was provided an opportunity to ask questions and all were answered. The patient agreed with the plan and demonstrated an understanding of the instructions.   The patient was advised to call back or seek an in-person evaluation if the symptoms worsen or if the condition fails to improve as anticipated.  I provided 15 minutes of non-face-to-face time during this  encounter.   Oletta Darter, MD

## 2021-02-14 ENCOUNTER — Other Ambulatory Visit (HOSPITAL_COMMUNITY): Payer: Self-pay | Admitting: Psychiatry

## 2021-02-14 DIAGNOSIS — F410 Panic disorder [episodic paroxysmal anxiety] without agoraphobia: Secondary | ICD-10-CM

## 2021-02-14 DIAGNOSIS — G4700 Insomnia, unspecified: Secondary | ICD-10-CM

## 2021-02-15 ENCOUNTER — Other Ambulatory Visit (HOSPITAL_COMMUNITY): Payer: Self-pay | Admitting: Psychiatry

## 2021-02-15 ENCOUNTER — Telehealth (INDEPENDENT_AMBULATORY_CARE_PROVIDER_SITE_OTHER): Payer: Self-pay | Admitting: Psychiatry

## 2021-02-15 ENCOUNTER — Other Ambulatory Visit: Payer: Self-pay

## 2021-02-15 ENCOUNTER — Telehealth (HOSPITAL_COMMUNITY): Payer: Self-pay | Admitting: Psychiatry

## 2021-02-15 DIAGNOSIS — F3130 Bipolar disorder, current episode depressed, mild or moderate severity, unspecified: Secondary | ICD-10-CM

## 2021-02-15 DIAGNOSIS — F410 Panic disorder [episodic paroxysmal anxiety] without agoraphobia: Secondary | ICD-10-CM

## 2021-02-15 DIAGNOSIS — F411 Generalized anxiety disorder: Secondary | ICD-10-CM

## 2021-02-15 DIAGNOSIS — G4700 Insomnia, unspecified: Secondary | ICD-10-CM

## 2021-02-15 MED ORDER — HYDROXYZINE HCL 25 MG PO TABS: 25.0000 mg | ORAL_TABLET | Freq: Every day | ORAL | 0 refills | Status: DC

## 2021-02-15 MED ORDER — OLANZAPINE 20 MG PO TABS: 20.0000 mg | ORAL_TABLET | Freq: Every day | ORAL | 0 refills | Status: DC

## 2021-02-15 MED ORDER — LORAZEPAM 0.5 MG PO TABS: 0.5000 mg | ORAL_TABLET | Freq: Two times a day (BID) | ORAL | 0 refills | Status: DC | PRN

## 2021-02-15 NOTE — Progress Notes (Signed)
Virtual Visit via Telephone Note  I connected with Kelly Beasley on 02/15/21 at  2:00 PM EDT by telephone and verified that I am speaking with the correct person using two identifiers.  Location: Patient: home Provider: office   I discussed the limitations, risks, security and privacy concerns of performing an evaluation and management service by telephone and the availability of in person appointments. I also discussed with the patient that there may be a patient responsible charge related to this service. The patient expressed understanding and agreed to proceed.   History of Present Illness: "I am doing a whole lot better since you changed my meds". Her level of depression has decreased to 7/10. Her depression has only happened several days instead of every day. Her sleep has improved to 6 hrs/night with Vistaril 10mg  and then another 10mg  in the middle of the night. . Her energy has improved and she is more active. She admits to random passive thoughts of death. She denies SI/HI.  At home she is restless and it is hard to sit down. It is nervous energy. Kelly Beasley continues to experience high level of anxiety and panic attacks near daily. Ativan 0.25mg  BID is not helping. Kelly Beasley denies any manic or hypomanic like symptoms and episodes.     Observations/Objective:  General Appearance: unable to assess  Eye Contact:  unable to assess  Speech:  Clear and Coherent and Slow  Volume:  Normal  Mood:  Anxious and Depressed  Affect:  Congruent  Thought Process:  Goal Directed, Linear and Descriptions of Associations: Intact  Orientation:  Full (Time, Place, and Person)  Thought Content:  Logical  Suicidal Thoughts:  No  Homicidal Thoughts:  No  Memory:  Immediate;   Good  Judgement:  Good  Insight:  Good  Psychomotor Activity: unable to assess  Concentration:  Concentration: Good  Recall:  Good  Fund of Knowledge:  Good  Language:  Good  Akathisia:  unable to assess  Handed:  Right  AIMS  (if indicated):     Assets:  Communication Skills Desire for Improvement Financial Resources/Insurance Housing Resilience Social Support Talents/Skills Transportation Vocational/Educational  ADL's:  unable to assess  Cognition:  WNL  Sleep:         Assessment and Plan: 1. GAD (generalized anxiety disorder) - LORazepam (ATIVAN) 0.5 MG tablet; Take 1 tablet (0.5 mg total) by mouth 2 (two) times daily as needed for anxiety.  Dispense: 60 tablet; Refill: 0 - continue Prozac 80mg  qD  2. Bipolar I disorder, most recent episode depressed (HCC) - OLANZapine (ZYPREXA) 20 MG tablet; Take 1 tablet (20 mg total) by mouth at bedtime.  Dispense: 90 tablet; Refill: 0 - Prozac 80mg  po qD  3. Panic disorder - hydrOXYzine (ATARAX/VISTARIL) 25 MG tablet; Take 1 tablet (25 mg total) by mouth at bedtime.  Dispense: 90 tablet; Refill: 0 - LORazepam (ATIVAN) 0.5 MG tablet; Take 1 tablet (0.5 mg total) by mouth 2 (two) times daily as needed for anxiety.  Dispense: 60 tablet; Refill: 0= no refills, pt verbalized understanding - Prozac 80mg  qD  4. Insomnia, unspecified type - hydrOXYzine (ATARAX/VISTARIL) 25 MG tablet; Take 1 tablet (25 mg total) by mouth at bedtime.  Dispense: 90 tablet; Refill: 0 - OLANZapine (ZYPREXA) 20 MG tablet; Take 1 tablet (20 mg total) by mouth at bedtime.  Dispense: 90 tablet; Refill: 0   Depression screen [facility] 2/9 02/15/2021 01/18/2021  Decreased Interest 1 3  Down, Depressed, Hopeless 1 3  PHQ - 2  Score 2 6  Altered sleeping 0 2  Tired, decreased energy 0 2  Change in appetite 0 0  Feeling bad or failure about yourself  3 2  Trouble concentrating 0 -  Moving slowly or fidgety/restless 1 0  Suicidal thoughts 1 1  PHQ-9 Score 7 13  Difficult doing work/chores Somewhat difficult Very difficult    Flowsheet Row Video Visit from 02/15/2021 in [facility] Video Visit from 01/18/2021 in [facility]  C-SSRS RISK CATEGORY Error: Q3, 4, or 5 should not be populated when Q2 is No Error: Q3, 4, or 5 should not be populated when Q2 is No       Follow Up Instructions: In 6-8 weeks or sooner if needed   I discussed the assessment and treatment plan with the patient. The patient was provided an opportunity to ask questions and all were answered. The patient agreed with the plan and demonstrated an understanding of the instructions.   The patient was advised to call back or seek an in-person evaluation if the symptoms worsen or if the condition fails to improve as anticipated.  I provided 15 minutes of non-face-to-face time during this encounter.   Oletta Darter, MD

## 2021-02-16 ENCOUNTER — Encounter (HOSPITAL_COMMUNITY): Payer: Self-pay | Admitting: Emergency Medicine

## 2021-02-16 ENCOUNTER — Other Ambulatory Visit: Payer: Self-pay

## 2021-02-16 ENCOUNTER — Ambulatory Visit (HOSPITAL_COMMUNITY)
Admission: EM | Admit: 2021-02-16 | Discharge: 2021-02-16 | Disposition: A | Discharge status: Home / Self Care | Payer: Commercial Managed Care - PPO | Attending: Internal Medicine | Admitting: Internal Medicine

## 2021-02-16 DIAGNOSIS — Z20822 Contact with and (suspected) exposure to covid-19: Secondary | ICD-10-CM

## 2021-02-16 DIAGNOSIS — R059 Cough, unspecified: Secondary | ICD-10-CM | POA: Diagnosis present

## 2021-02-16 MED ORDER — BENZONATATE 100 MG PO CAPS: 100.0000 mg | ORAL_CAPSULE | Freq: Three times a day (TID) | ORAL | 0 refills | Status: DC

## 2021-02-16 MED ORDER — FLUTICASONE PROPIONATE 50 MCG/ACT NA SUSP: 2.0000 | Freq: Every day | NASAL | 0 refills | Status: DC

## 2021-02-16 NOTE — ED Triage Notes (Signed)
Pt presents today with c/o of dry cough with low grade temp x 1 week. She does report son was diagnosed with Covid and she would like to be tested.

## 2021-02-16 NOTE — ED Provider Notes (Signed)
[facility]    CSN: [id number] Arrival date & time: 02/16/21  1256      History   Chief Complaint Chief Complaint  Patient presents with  . Cough  . Fever    HPI Kelly Beasley is a 50 y.o. female.   HPI   Cough: Pt states that for the past week he has had a dry cough and fever. She reports that her son was recently diagnosed with COVID and she is concerned that she may have it. Cough is nonproductive without wheezing or SOB, chest pain, calf pain. Fevers have been low grade. She has used OTC medication with minimal relief of symptoms.   Past Medical History:  Diagnosis Date  . Anxiety   . Bipolar disorder (HCC)   . Depression     Patient Active Problem List   Diagnosis Date Noted  . Bipolar I disorder, most recent episode depressed (HCC) 08/27/2018  . Insomnia 08/27/2018    History reviewed. No pertinent surgical history.  OB History   No obstetric history on file.      Home Medications    Prior to Admission medications   Medication Sig Start Date End Date Taking? Authorizing Provider  benzonatate (TESSALON) 100 MG capsule Take 1 capsule (100 mg total) by mouth every 8 (eight) hours. 02/16/21  Yes Clent Jacks M, PA-C  FLUoxetine (PROZAC) 40 MG capsule TAKE 2 CAPSULES BY MOUTH DAILY 02/15/21  Yes Oletta Darter, MD  fluticasone [facility]) 50 MCG/ACT nasal spray Place 2 sprays into both nostrils daily. 02/16/21  Yes Maybelle Depaoli, Maralyn Sago M, PA-C  hydrOXYzine (ATARAX/VISTARIL) 25 MG tablet Take 1 tablet (25 mg total) by mouth at bedtime. 02/15/21  Yes Oletta Darter, MD  LORazepam (ATIVAN) 0.5 MG tablet Take 1 tablet (0.5 mg total) by mouth 2 (two) times daily as needed for anxiety. 02/15/21  Yes Oletta Darter, MD  OLANZapine (ZYPREXA) 20 MG tablet Take 1 tablet (20 mg total) by mouth at bedtime. 02/15/21  Yes Oletta Darter, MD    Family History Family History  Problem Relation Age of Onset  . Cancer Mother     Social History Social History    Tobacco Use  . Smoking status: Never Smoker  . Smokeless tobacco: Never Used  Vaping Use  . Vaping Use: Never used  Substance Use Topics  . Alcohol use: No  . Drug use: No     Allergies   Patient has no known allergies.   Review of Systems Review of Systems  As stated above in HPI Physical Exam Triage Vital Signs ED Triage Vitals  Enc Vitals Group     BP 02/16/21 1404 (!) 146/89     Pulse Rate 02/16/21 1404 77     Resp 02/16/21 1404 18     Temp 02/16/21 1404 (!) 97.2 F (36.2 C)     Temp Source 02/16/21 1404 Oral     SpO2 02/16/21 1404 99 %     Weight --      Height --      Head Circumference --      Peak Flow --      Pain Score 02/16/21 1401 0     Pain Loc --      Pain Edu? --      Excl. in GC? --    No data found.  Updated Vital Signs BP (!) 146/89 (BP Location: Left Arm)   Pulse 77   Temp (!) 97.2 F (36.2 C) (Oral)   Resp 18  LMP 02/15/2021   SpO2 99%   Physical Exam Vitals and nursing note reviewed.  Constitutional:      General: She is not in acute distress.    Appearance: Normal appearance. She is not ill-appearing, toxic-appearing or diaphoretic.  HENT:     Head: Normocephalic and atraumatic.     Right Ear: Tympanic membrane, ear canal and external ear normal.     Left Ear: Tympanic membrane, ear canal and external ear normal.     Nose: Nose normal.     Mouth/Throat:     Mouth: Mucous membranes are moist.     Pharynx: No oropharyngeal exudate or posterior oropharyngeal erythema.  Eyes:     Extraocular Movements: Extraocular movements intact.     Pupils: Pupils are equal, round, and reactive to light.  Cardiovascular:     Rate and Rhythm: Normal rate and regular rhythm.     Heart sounds: Normal heart sounds.  Pulmonary:     Effort: Pulmonary effort is normal. No respiratory distress.     Breath sounds: Normal breath sounds. No stridor. No wheezing, rhonchi or rales.  Chest:     Chest wall: No tenderness.  Musculoskeletal:      Cervical back: Normal range of motion and neck supple.  Lymphadenopathy:     Cervical: No cervical adenopathy.  Skin:    General: Skin is warm.  Neurological:     Mental Status: She is alert and oriented to person, place, and time.  Psychiatric:        Behavior: Behavior normal.      UC Treatments / Results  Labs (all labs ordered are listed, but only abnormal results are displayed) Labs Reviewed  SARS CORONAVIRUS 2 (TAT 6-24 HRS)    EKG   Radiology No results found.  Procedures Procedures (including critical care time)  Medications Ordered in UC Medications - No data to display  Initial Impression / Assessment and Plan / UC Course  I have reviewed the triage vital signs and the nursing notes.  Pertinent labs & imaging results that were available during my care of the patient were reviewed by me and considered in my medical decision making (see chart for details).     New.  Treating with Tessalon and Flonase while we await her COVID-19 testing.  Information regarding COVID-19 given to patient.  Work note given as she has passed day 7 of illness and is afebrile.  Discussed red flag symptoms Final Clinical Impressions(s) / UC Diagnoses   Final diagnoses:  Cough  Close exposure to COVID-19 virus   Discharge Instructions   None    ED Prescriptions    Medication Sig Dispense Auth. Provider   benzonatate (TESSALON) 100 MG capsule Take 1 capsule (100 mg total) by mouth every 8 (eight) hours. 21 capsule Deserie Dirks M, PA-C   fluticasone [facility]) 50 MCG/ACT nasal spray Place 2 sprays into both nostrils daily. 16 mL Rushie Chestnut, [place]     PDMP not reviewed this encounter.   Rushie Chestnut, [place] 02/16/21 1507

## 2021-02-17 LAB — SARS CORONAVIRUS 2 (TAT 6-24 HRS): SARS Coronavirus 2: NEGATIVE

## 2021-03-24 ENCOUNTER — Ambulatory Visit (HOSPITAL_COMMUNITY)
Admission: EM | Admit: 2021-03-24 | Discharge: 2021-03-24 | Disposition: A | Discharge status: Home / Self Care | Payer: Commercial Managed Care - PPO | Attending: Emergency Medicine | Admitting: Emergency Medicine

## 2021-03-24 ENCOUNTER — Encounter (HOSPITAL_COMMUNITY): Payer: Self-pay | Admitting: *Deleted

## 2021-03-24 ENCOUNTER — Other Ambulatory Visit: Payer: Self-pay

## 2021-03-24 DIAGNOSIS — Z79899 Other long term (current) drug therapy: Secondary | ICD-10-CM | POA: Insufficient documentation

## 2021-03-24 DIAGNOSIS — J029 Acute pharyngitis, unspecified: Secondary | ICD-10-CM | POA: Diagnosis present

## 2021-03-24 DIAGNOSIS — J069 Acute upper respiratory infection, unspecified: Secondary | ICD-10-CM | POA: Diagnosis not present

## 2021-03-24 DIAGNOSIS — Z20822 Contact with and (suspected) exposure to covid-19: Secondary | ICD-10-CM | POA: Insufficient documentation

## 2021-03-24 NOTE — Discharge Instructions (Signed)
You most likely have viral illness.   You can take Tylenol and/or Ibuprofen as needed for fever reduction and pain relief.   You can use Flonase 2 sprays in each nostril daily.   For cough: honey 1/2 to 1 teaspoon (you can dilute the honey in water or another fluid).  You can use a humidifier for chest congestion and cough.  If you don't have a humidifier, you can sit in the bathroom with the hot shower running.    For sore throat: try warm salt water gargles, cepacol lozenges, throat spray, warm tea or water with lemon/honey, popsicles or ice, or OTC cold relief medicine for throat discomfort.    For congestion: take a daily anti-histamine like Zyrtec, Claritin, and a oral decongestant to help with post nasal drip that may be irritating your throat.    It is important to stay hydrated: drink plenty of fluids (water, gatorade/powerade/pedialyte, juices, or teas) to keep your throat moisturized and help further relieve irritation/discomfort.   Return or go to the Emergency Department if symptoms worsen or do not improve in the next few days.

## 2021-03-24 NOTE — ED Provider Notes (Signed)
[facility]    CSN: [id number] Arrival date & time: 03/24/21  1515      History   Chief Complaint Chief Complaint  Patient presents with  . Laryngitis  . Sore Throat    HPI Kelly Beasley is a 50 y.o. female.   Patient here for evaluation of cough and sore throat that started several days ago.  Has not taken any OTC medications or treatments.  Patient does work in Teacher, music. Denies any trauma, injury, or other precipitating event.  Denies any specific alleviating or aggravating factors.  Denies any fevers, chest pain, shortness of breath, N/V/D, numbness, tingling, weakness, abdominal pain, or headaches.    ROS: As per HPI, all other pertinent ROS negative   The history is provided by the patient.    Past Medical History:  Diagnosis Date  . Anxiety   . Bipolar disorder (HCC)   . Depression     Patient Active Problem List   Diagnosis Date Noted  . Bipolar I disorder, most recent episode depressed (HCC) 08/27/2018  . Insomnia 08/27/2018    History reviewed. No pertinent surgical history.  OB History   No obstetric history on file.      Home Medications    Prior to Admission medications   Medication Sig Start Date End Date Taking? Authorizing Provider  benzonatate (TESSALON) 100 MG capsule Take 1 capsule (100 mg total) by mouth every 8 (eight) hours. 02/16/21   Rushie Chestnut, PA-C  FLUoxetine (PROZAC) 40 MG capsule TAKE 2 CAPSULES BY MOUTH DAILY 02/15/21   Oletta Darter, MD  fluticasone [facility]) 50 MCG/ACT nasal spray Place 2 sprays into both nostrils daily. 02/16/21   Rushie Chestnut, PA-C  hydrOXYzine (ATARAX/VISTARIL) 25 MG tablet Take 1 tablet (25 mg total) by mouth at bedtime. 02/15/21   Oletta Darter, MD  LORazepam (ATIVAN) 0.5 MG tablet Take 1 tablet (0.5 mg total) by mouth 2 (two) times daily as needed for anxiety. 02/15/21   Oletta Darter, MD  OLANZapine (ZYPREXA) 20 MG tablet Take 1 tablet (20 mg total) by mouth at bedtime. 02/15/21    Oletta Darter, MD    Family History Family History  Problem Relation Age of Onset  . Cancer Mother     Social History Social History   Tobacco Use  . Smoking status: Never Smoker  . Smokeless tobacco: Never Used  Vaping Use  . Vaping Use: Never used  Substance Use Topics  . Alcohol use: No  . Drug use: No     Allergies   Patient has no known allergies.   Review of Systems Review of Systems  HENT: Positive for sore throat.   Respiratory: Positive for cough.   All other systems reviewed and are negative.    Physical Exam Triage Vital Signs ED Triage Vitals  Enc Vitals Group     BP 03/24/21 1601 130/83     Pulse Rate 03/24/21 1601 96     Resp 03/24/21 1601 16     Temp 03/24/21 1601 99.5 F (37.5 C)     Temp Source 03/24/21 1601 Oral     SpO2 03/24/21 1601 95 %     Weight --      Height --      Head Circumference --      Peak Flow --      Pain Score 03/24/21 1558 0     Pain Loc --      Pain Edu? --      Excl.  in GC? --    No data found.  Updated Vital Signs BP 130/83 (BP Location: Right Arm)   Pulse 96   Temp 99.5 F (37.5 C) (Oral)   Resp 16   LMP 03/17/2021   SpO2 95%   Visual Acuity Right Eye Distance:   Left Eye Distance:   Bilateral Distance:    Right Eye Near:   Left Eye Near:    Bilateral Near:     Physical Exam Vitals and nursing note reviewed.  Constitutional:      General: She is not in acute distress.    Appearance: Normal appearance. She is not ill-appearing, toxic-appearing or diaphoretic.  HENT:     Head: Normocephalic and atraumatic.     Nose: Rhinorrhea present. No congestion.     Mouth/Throat:     Mouth: Mucous membranes are moist.     Pharynx: Oropharynx is clear. Uvula midline. No pharyngeal swelling, oropharyngeal exudate, posterior oropharyngeal erythema or uvula swelling.     Tonsils: No tonsillar exudate or tonsillar abscesses. 0 on the right. 0 on the left.  Eyes:     Conjunctiva/sclera: Conjunctivae  normal.  Cardiovascular:     Rate and Rhythm: Normal rate.     Pulses: Normal pulses.  Pulmonary:     Effort: Pulmonary effort is normal.  Abdominal:     General: Abdomen is flat.  Musculoskeletal:        General: Normal range of motion.     Cervical back: Normal range of motion.  Skin:    General: Skin is warm and dry.  Neurological:     General: No focal deficit present.     Mental Status: She is alert and oriented to person, place, and time.  Psychiatric:        Mood and Affect: Mood normal.      UC Treatments / Results  Labs (all labs ordered are listed, but only abnormal results are displayed) Labs Reviewed  SARS CORONAVIRUS 2 (TAT 6-24 HRS)    EKG   Radiology No results found.  Procedures Procedures (including critical care time)  Medications Ordered in UC Medications - No data to display  Initial Impression / Assessment and Plan / UC Course  I have reviewed the triage vital signs and the nursing notes.  Pertinent labs & imaging results that were available during my care of the patient were reviewed by me and considered in my medical decision making (see chart for details).     Assessment negative for red flags or concerns.  This is likely viral pharyngitis or viral upper respiratory tract infection.  COVID test pending.  Discussed conservative symptom management as described below in discharge instructions.  Follow-up as needed.   Final Clinical Impressions(s) / UC Diagnoses   Final diagnoses:  Viral pharyngitis  Viral upper respiratory tract infection     Discharge Instructions     You most likely have viral illness.   You can take Tylenol and/or Ibuprofen as needed for fever reduction and pain relief.   You can use Flonase 2 sprays in each nostril daily.   For cough: honey 1/2 to 1 teaspoon (you can dilute the honey in water or another fluid).  You can use a humidifier for chest congestion and cough.  If you don't have a humidifier, you can  sit in the bathroom with the hot shower running.    For sore throat: try warm salt water gargles, cepacol lozenges, throat spray, warm tea or water with lemon/honey, popsicles  or ice, or OTC cold relief medicine for throat discomfort.    For congestion: take a daily anti-histamine like Zyrtec, Claritin, and a oral decongestant to help with post nasal drip that may be irritating your throat.    It is important to stay hydrated: drink plenty of fluids (water, gatorade/powerade/pedialyte, juices, or teas) to keep your throat moisturized and help further relieve irritation/discomfort.   Return or go to the Emergency Department if symptoms worsen or do not improve in the next few days.      ED Prescriptions    None     PDMP not reviewed this encounter.   Ivette Loyal, NP 03/24/21 [phone]

## 2021-03-24 NOTE — ED Triage Notes (Signed)
Pt reports a cough and sore throat.

## 2021-03-25 LAB — SARS CORONAVIRUS 2 (TAT 6-24 HRS): SARS Coronavirus 2: NEGATIVE

## 2021-03-29 ENCOUNTER — Telehealth (INDEPENDENT_AMBULATORY_CARE_PROVIDER_SITE_OTHER): Payer: Commercial Managed Care - PPO | Admitting: Psychiatry

## 2021-03-29 ENCOUNTER — Other Ambulatory Visit: Payer: Self-pay

## 2021-03-29 DIAGNOSIS — G4700 Insomnia, unspecified: Secondary | ICD-10-CM

## 2021-03-29 DIAGNOSIS — F3130 Bipolar disorder, current episode depressed, mild or moderate severity, unspecified: Secondary | ICD-10-CM | POA: Diagnosis not present

## 2021-03-29 DIAGNOSIS — F411 Generalized anxiety disorder: Secondary | ICD-10-CM | POA: Diagnosis not present

## 2021-03-29 DIAGNOSIS — F410 Panic disorder [episodic paroxysmal anxiety] without agoraphobia: Secondary | ICD-10-CM | POA: Diagnosis not present

## 2021-03-29 MED ORDER — HYDROXYZINE HCL 25 MG PO TABS: 25.0000 mg | ORAL_TABLET | Freq: Every day | ORAL | 0 refills | Status: DC

## 2021-03-29 MED ORDER — FLUOXETINE HCL 40 MG PO CAPS: 80.0000 mg | ORAL_CAPSULE | Freq: Every day | ORAL | 0 refills | Status: DC

## 2021-03-29 MED ORDER — LORAZEPAM 0.5 MG PO TABS: 0.5000 mg | ORAL_TABLET | Freq: Two times a day (BID) | ORAL | 1 refills | Status: DC | PRN

## 2021-03-29 MED ORDER — OLANZAPINE 15 MG PO TABS: 30.0000 mg | ORAL_TABLET | Freq: Every day | ORAL | 1 refills | Status: DC

## 2021-03-29 NOTE — Progress Notes (Signed)
Virtual Visit via Telephone Note  I connected with Kelly Beasley on 03/29/21 at  3:00 PM EDT by telephone and verified that I am speaking with the correct person using two identifiers. We were unable to connect on the video chat so opted to continue by phone.   Location: Patient: home Provider: office   I discussed the limitations, risks, security and privacy concerns of performing an evaluation and management service by telephone and the availability of in person appointments. I also discussed with the patient that there may be a patient responsible charge related to this service. The patient expressed understanding and agreed to proceed.   History of Present Illness: Kelly Beasley states she is doing better with the increased dose of Zyprexa. She is sleeping better. Her depression and anxiety are better. She has still has fleeting SI without plan or intent every other day. Her energy and appetite are good. She denies HI. Pt denies recent manic and hypomanic symptoms including periods of decreased need for sleep, increased energy, mood lability, impulsivity, FOI, and excessive spending. She is having less frequent panic attacks about 2-3x/week.     Observations/Objective:  General Appearance: unable to assess  Eye Contact:  unable to assess  Speech:  Clear and Coherent and Normal Rate  Volume:  Normal  Mood:  Depressed  Affect:  Constricted  Thought Process:  Goal Directed, Linear and Descriptions of Associations: Intact  Orientation:  Full (Time, Place, and Person)  Thought Content:  Logical  Suicidal Thoughts:  Yes.  without intent/plan  Homicidal Thoughts:  No  Memory:  Immediate;   Good  Judgement:  Good  Insight:  Good  Psychomotor Activity: unable to assess  Concentration:  Concentration: Good  Recall:  Good  Fund of Knowledge:  Good  Language:  Good  Akathisia:  unable to assess  Handed:  Right  AIMS (if indicated):     Assets:  Communication Skills Desire for  Improvement Financial Resources/Insurance Housing Resilience Talents/Skills Transportation Vocational/Educational  ADL's:  unable to assess  Cognition:  WNL  Sleep:         Assessment and Plan: Depression screen [facility] 2/9 03/29/2021 02/15/2021 01/18/2021  Decreased Interest 3 1 3   Down, Depressed, Hopeless 3 1 3   PHQ - 2 Score 6 2 6   Altered sleeping 0 0 2  Tired, decreased energy 0 0 2  Change in appetite 0 0 0  Feeling bad or failure about yourself  2 3 2   Trouble concentrating 2 0 -  Moving slowly or fidgety/restless 0 1 0  Suicidal thoughts 2 1 1   PHQ-9 Score 12 7 13   Difficult doing work/chores Somewhat difficult Somewhat difficult Very difficult    Flowsheet Row Video Visit from 03/29/20[facility] from 03/24/2021 in [facility] ED from 02/16/2021 in [facility] RISK CATEGORY Error: Q7 should not be populated when Q6 is No No Risk No Risk      - increase Zyprexa 30mg  po qHS  1. Bipolar I disorder, most recent episode depressed (HCC) - OLANZapine (ZYPREXA) 15 MG tablet; Take 2 tablets (30 mg total) by mouth at bedtime.  Dispense: 60 tablet; Refill: 1 - FLUoxetine (PROZAC) 40 MG capsule; Take 2 capsules (80 mg total) by mouth daily.  Dispense: 180 capsule; Refill: 0  2. Panic disorder - FLUoxetine (PROZAC) 40 MG capsule; Take 2 capsules (80 mg total) by mouth daily.  Dispense: 180 capsule; Refill:  0 - hydrOXYzine (ATARAX/VISTARIL) 25 MG tablet; Take 1 tablet (25 mg total) by mouth at bedtime.  Dispense: 90 tablet; Refill: 0 - LORazepam (ATIVAN) 0.5 MG tablet; Take 1 tablet (0.5 mg total) by mouth 2 (two) times daily as needed for anxiety.  Dispense: 60 tablet; Refill: 1  3. GAD (generalized anxiety disorder) - FLUoxetine (PROZAC) 40 MG capsule; Take 2 capsules (80 mg total) by mouth daily.  Dispense: 180 capsule; Refill: 0 - LORazepam (ATIVAN) 0.5 MG tablet; Take 1  tablet (0.5 mg total) by mouth 2 (two) times daily as needed for anxiety.  Dispense: 60 tablet; Refill: 1  4. Insomnia, unspecified type - hydrOXYzine (ATARAX/VISTARIL) 25 MG tablet; Take 1 tablet (25 mg total) by mouth at bedtime.  Dispense: 90 tablet; Refill: 0  - referred for therapy    Follow Up Instructions: In 6-8 weeks or sooner if needed   I discussed the assessment and treatment plan with the patient. The patient was provided an opportunity to ask questions and all were answered. The patient agreed with the plan and demonstrated an understanding of the instructions.   The patient was advised to call back or seek an in-person evaluation if the symptoms worsen or if the condition fails to improve as anticipated.  I provided 13 minutes of non-face-to-face time during this encounter.   Oletta Darter, MD

## 2021-04-09 ENCOUNTER — Other Ambulatory Visit: Payer: Self-pay

## 2021-04-09 ENCOUNTER — Ambulatory Visit (HOSPITAL_COMMUNITY): Payer: Self-pay | Admitting: Licensed Clinical Social Worker

## 2021-04-12 ENCOUNTER — Other Ambulatory Visit (HOSPITAL_COMMUNITY): Payer: Self-pay | Admitting: Psychiatry

## 2021-04-12 DIAGNOSIS — F3130 Bipolar disorder, current episode depressed, mild or moderate severity, unspecified: Secondary | ICD-10-CM

## 2021-04-17 ENCOUNTER — Ambulatory Visit (HOSPITAL_COMMUNITY): Payer: Commercial Managed Care - PPO | Admitting: Licensed Clinical Social Worker

## 2021-04-17 ENCOUNTER — Telehealth (HOSPITAL_COMMUNITY): Payer: Self-pay | Admitting: Licensed Clinical Social Worker

## 2021-04-17 ENCOUNTER — Other Ambulatory Visit: Payer: Self-pay

## 2021-04-17 NOTE — Telephone Encounter (Signed)
Kelly Beasley had a virtual assessment scheduled today at 10am.  She presented for this appointment on time, was alert, oriented x5, with no evidence or self-report of active SI/HI or A/V H.  She disclosed that she was at work, and would not be able to speak for the full hour required to successfully complete clinical assessment, screenings, and treatment plan.  Clinician recommended rescheduling for a more convenient time, and Jenay was agreeable to this, reporting that she would outreach front desk staff for assistance with this when she gets off her shift.    Noralee Stain, LCSW, LCAS 04/17/21

## 2021-05-15 ENCOUNTER — Other Ambulatory Visit (HOSPITAL_COMMUNITY): Payer: Self-pay | Admitting: Psychiatry

## 2021-05-15 DIAGNOSIS — F3130 Bipolar disorder, current episode depressed, mild or moderate severity, unspecified: Secondary | ICD-10-CM

## 2021-05-15 DIAGNOSIS — G4700 Insomnia, unspecified: Secondary | ICD-10-CM

## 2021-06-21 ENCOUNTER — Telehealth (HOSPITAL_COMMUNITY): Payer: Self-pay | Admitting: Psychiatry

## 2021-06-28 ENCOUNTER — Other Ambulatory Visit: Payer: Self-pay

## 2021-06-28 ENCOUNTER — Telehealth (HOSPITAL_COMMUNITY): Payer: Commercial Managed Care - PPO | Admitting: Psychiatry

## 2021-07-05 ENCOUNTER — Other Ambulatory Visit: Payer: Self-pay

## 2021-07-05 ENCOUNTER — Telehealth (INDEPENDENT_AMBULATORY_CARE_PROVIDER_SITE_OTHER): Payer: Commercial Managed Care - PPO | Admitting: Psychiatry

## 2021-07-05 DIAGNOSIS — G4700 Insomnia, unspecified: Secondary | ICD-10-CM | POA: Diagnosis not present

## 2021-07-05 DIAGNOSIS — F411 Generalized anxiety disorder: Secondary | ICD-10-CM | POA: Diagnosis not present

## 2021-07-05 DIAGNOSIS — F410 Panic disorder [episodic paroxysmal anxiety] without agoraphobia: Secondary | ICD-10-CM | POA: Diagnosis not present

## 2021-07-05 DIAGNOSIS — F3130 Bipolar disorder, current episode depressed, mild or moderate severity, unspecified: Secondary | ICD-10-CM

## 2021-07-05 MED ORDER — VENLAFAXINE HCL ER 75 MG PO CP24: 75.0000 mg | ORAL_CAPSULE | Freq: Every day | ORAL | 1 refills | Status: DC

## 2021-07-05 MED ORDER — LORAZEPAM 0.5 MG PO TABS: 0.5000 mg | ORAL_TABLET | Freq: Two times a day (BID) | ORAL | 1 refills | Status: DC | PRN

## 2021-07-05 MED ORDER — OLANZAPINE 15 MG PO TABS
30.0000 mg | ORAL_TABLET | Freq: Every day | ORAL | 0 refills | Status: DC
Start: 2021-07-05 — End: 2021-08-09

## 2021-07-05 MED ORDER — HYDROXYZINE HCL 25 MG PO TABS: 25.0000 mg | ORAL_TABLET | Freq: Every day | ORAL | 0 refills | Status: DC

## 2021-07-05 NOTE — Progress Notes (Signed)
Virtual Visit via Telephone Note  I connected with Kelly Beasley on 07/05/21 at  3:15 PM EDT by telephone and verified that I am speaking with the correct person using two identifiers. Kelly Beasley is at work so she could not do a video session.  Location: Patient: at work Provider: home   I discussed the limitations, risks, security and privacy concerns of performing an evaluation and management service by telephone and the availability of in person appointments. I also discussed with the patient that there may be a patient responsible charge related to this service. The patient expressed understanding and agreed to proceed.   History of Present Illness: For the last several week Kelly Beasley has been having near daily visual hallucinations. It can happen at any time or anywhere. It is a brief flash and it happens multiple times thru out the day. It seems to be getting worse. She denies AH. She is not sleeping well and getting up multiple times. She is only getting about 4-6 hrs and denies napping in the day. Kelly Beasley denies paranoia but her anxiety is high. It is especially bad when she is driving. Kelly Beasley has about 1-2 stress induced panic attacks a week. Her depression is getting worse. Her energy and motivation are low. She wants to spend all her time in bed and has no desire to do anything. She forces herself to get up and do what she needs to do. She has been depressed every day for the last 2 weeks. Her appetite is ok. Her concentration is ok but she has to push herself to get things done at work. Kelly Beasley often feels like a failure and that she makes a lot of bad decisions. Kelly Beasley sometimes wishes she was already dead. She denies SI/HI. Pt denies recent manic and hypomanic symptoms including periods of decreased need for sleep, increased energy, mood lability, impulsivity, FOI, and excessive spending. She has not yet set up an appointment with a therapist but plans to do so soon.      Observations/Objective:  General Appearance: unable to assess  Eye Contact:  unable to assess  Speech:  Clear and Coherent and Slow  Volume:  Normal  Mood:  Anxious and Depressed  Affect:  Congruent  Thought Process:  Goal Directed, Linear, and Descriptions of Associations: Intact  Orientation:  Full (Time, Place, and Person)  Thought Content:  Hallucinations: Visual  Suicidal Thoughts:  No  Homicidal Thoughts:  No  Memory:  Immediate;   Good  Judgement:  Good  Insight:  Good  Psychomotor Activity: unable to assess  Concentration:  Concentration: Good  Recall:  Good  Fund of Knowledge:  Good  Language:  Good  Akathisia:  unable to assess  Handed:  unable to assess  AIMS (if indicated):     Assets:  Communication Skills Desire for Improvement Financial Resources/Insurance Housing Resilience Talents/Skills Transportation Vocational/Educational  ADL's:  unable to assess  Cognition:  WNL  Sleep:        Assessment and Plan: Depression screen [facility] 2/9 07/05/2021 03/29/2021 02/15/2021 01/18/2021  Decreased Interest 0 3 1 3   Down, Depressed, Hopeless 3 3 1 3   PHQ - 2 Score 3 6 2 6   Altered sleeping 3 0 0 2  Tired, decreased energy 3 0 0 2  Change in appetite 0 0 0 0  Feeling bad or failure about yourself  2 2 3 2   Trouble concentrating 2 2 0 -  Moving slowly or fidgety/restless 0 0 1 0  Suicidal thoughts  1 2 1 1   PHQ-9 Score 14 12 7 13   Difficult doing work/chores Very difficult Somewhat difficult Somewhat difficult Very difficult   Flowsheet Row Video Visit from 07/05/2021 in [facility] Video Visit from 03/29/2021 in [facility] from 03/24/2021 in [facility] RISK CATEGORY Error: Q3, 4, or 5 should not be populated when Q2 is No Error: Q7 should not be populated when Q6 is No No Risk      - her meds help some.  -d/c Prozac  - start Effexor XR 75mg  po  qD 1. Bipolar I disorder, most recent episode depressed (HCC) - OLANZapine (ZYPREXA) 15 MG tablet; Take 2 tablets (30 mg total) by mouth at bedtime.  Dispense: 180 tablet; Refill: 0 - venlafaxine XR (EFFEXOR XR) 75 MG 24 hr capsule; Take 1 capsule (75 mg total) by mouth daily.  Dispense: 30 capsule; Refill: 1  2. Panic disorder - LORazepam (ATIVAN) 0.5 MG tablet; Take 1 tablet (0.5 mg total) by mouth 2 (two) times daily as needed for anxiety.  Dispense: 60 tablet; Refill: 1 - hydrOXYzine (ATARAX/VISTARIL) 25 MG tablet; Take 1 tablet (25 mg total) by mouth at bedtime.  Dispense: 90 tablet; Refill: 0 - venlafaxine XR (EFFEXOR XR) 75 MG 24 hr capsule; Take 1 capsule (75 mg total) by mouth daily.  Dispense: 30 capsule; Refill: 1  3. GAD (generalized anxiety disorder) - LORazepam (ATIVAN) 0.5 MG tablet; Take 1 tablet (0.5 mg total) by mouth 2 (two) times daily as needed for anxiety.  Dispense: 60 tablet; Refill: 1 - venlafaxine XR (EFFEXOR XR) 75 MG 24 hr capsule; Take 1 capsule (75 mg total) by mouth daily.  Dispense: 30 capsule; Refill: 1  4. Insomnia, unspecified type - hydrOXYzine (ATARAX/VISTARIL) 25 MG tablet; Take 1 tablet (25 mg total) by mouth at bedtime.  Dispense: 90 tablet; Refill: 0      Follow Up Instructions: In 4-6 weeks or sooner if needed   I discussed the assessment and treatment plan with the patient. The patient was provided an opportunity to ask questions and all were answered. The patient agreed with the plan and demonstrated an understanding of the instructions.   The patient was advised to call back or seek an in-person evaluation if the symptoms worsen or if the condition fails to improve as anticipated.  I provided 14 minutes of non-face-to-face time during this encounter.   [facility], MD

## 2021-07-31 ENCOUNTER — Other Ambulatory Visit (HOSPITAL_COMMUNITY): Payer: Self-pay | Admitting: Psychiatry

## 2021-07-31 DIAGNOSIS — F3130 Bipolar disorder, current episode depressed, mild or moderate severity, unspecified: Secondary | ICD-10-CM

## 2021-07-31 DIAGNOSIS — F410 Panic disorder [episodic paroxysmal anxiety] without agoraphobia: Secondary | ICD-10-CM

## 2021-07-31 DIAGNOSIS — F411 Generalized anxiety disorder: Secondary | ICD-10-CM

## 2021-08-02 ENCOUNTER — Ambulatory Visit (HOSPITAL_COMMUNITY): Payer: Commercial Managed Care - PPO | Admitting: Psychiatry

## 2021-08-09 ENCOUNTER — Telehealth (INDEPENDENT_AMBULATORY_CARE_PROVIDER_SITE_OTHER): Payer: Commercial Managed Care - PPO | Admitting: Psychiatry

## 2021-08-09 ENCOUNTER — Other Ambulatory Visit: Payer: Self-pay

## 2021-08-09 DIAGNOSIS — F410 Panic disorder [episodic paroxysmal anxiety] without agoraphobia: Secondary | ICD-10-CM

## 2021-08-09 DIAGNOSIS — F3130 Bipolar disorder, current episode depressed, mild or moderate severity, unspecified: Secondary | ICD-10-CM | POA: Diagnosis not present

## 2021-08-09 DIAGNOSIS — G4700 Insomnia, unspecified: Secondary | ICD-10-CM | POA: Diagnosis not present

## 2021-08-09 DIAGNOSIS — F411 Generalized anxiety disorder: Secondary | ICD-10-CM

## 2021-08-09 MED ORDER — LORAZEPAM 0.5 MG PO TABS: 0.5000 mg | ORAL_TABLET | Freq: Two times a day (BID) | ORAL | 1 refills | Status: DC | PRN

## 2021-08-09 MED ORDER — OLANZAPINE 15 MG PO TABS
30.0000 mg | ORAL_TABLET | Freq: Every day | ORAL | 0 refills | Status: DC
Start: 2021-08-09 — End: 2021-11-15

## 2021-08-09 MED ORDER — VENLAFAXINE HCL ER 150 MG PO CP24
150.0000 mg | ORAL_CAPSULE | Freq: Every day | ORAL | 0 refills | Status: DC
Start: 2021-08-09 — End: 2021-11-08

## 2021-08-09 MED ORDER — HYDROXYZINE HCL 50 MG PO TABS: 50.0000 mg | ORAL_TABLET | Freq: Every day | ORAL | 0 refills | Status: DC

## 2021-08-09 NOTE — Progress Notes (Signed)
Virtual Visit via Telephone Note  I connected with Kelly Beasley on 08/09/21 at  9:00 AM EDT by telephone and verified that I am speaking with the correct person using two identifiers. Lawson does not have mychart. I texted her a mychart link by text message.   Location: Patient: home Provider: office   I discussed the limitations, risks, security and privacy concerns of performing an evaluation and management service by telephone and the availability of in person appointments. I also discussed with the patient that there may be a patient responsible charge related to this service. The patient expressed understanding and agreed to proceed.   History of Present Illness: Kelly Beasley states she is doing better. Her anxiety is mostly situational and occurs every 2-3 days. It stays for a few hours where she has racing thoughts and fatigue. Her panic attacks are stressed induced and happen once every 2-3 days. She takes Ativan daily. Her AH have decreased in intensity and volume. She hears one voice thru out the day. The voice sounds like someone calling her name or someone saying something to her but no one did. She feels like something behind her or out of the corner of eye. This occurs every day. The hallucinations are more tolerable and manageable. Her sleep is poor she has broken sleep for a total of 3-4 hrs/night. She works 2 full time jobs during the day and does not nap. Her energy is low. She still feels depressed but it is not as bad. She has constant feelings of low motivation and wanting to hide in bed. It occurs daily but she is able to push thru it. Brentley is endorsing anhedonia. When she gets home she eats and then goes to bed. She is always hard on herself. She has on/off passive thoughts of death but denies SI/HI. Pt denies recent manic and hypomanic symptoms including periods of decreased need for sleep, increased energy, mood lability, impulsivity, FOI, and excessive spending.     Observations/Objective:  General Appearance: unable to assess  Eye Contact:  unable to assess  Speech:  Clear and Coherent and Normal Rate  Volume:  Normal  Mood:  Anxious and Depressed  Affect:  Congruent  Thought Process:  Goal Directed, Linear, and Descriptions of Associations: Intact  Orientation:  Full (Time, Place, and Person)  Thought Content:  Logical  Suicidal Thoughts:  No  Homicidal Thoughts:  No  Memory:  Immediate;   Good  Judgement:  Good  Insight:  Good  Psychomotor Activity: unable to assess  Concentration:  Attention Span: Good  Recall:  Good  Fund of Knowledge:  Good  Language:  Good  Akathisia:  unable to assess  Handed:  unable to assess  AIMS (if indicated):     Assets:  Communication Skills Desire for Improvement Financial Resources/Insurance Housing Resilience Transportation Vocational/Educational  ADL's:  unable to assess  Cognition:  WNL  Sleep:        Assessment and Plan: Depression screen [facility] 2/9 08/09/2021 07/05/2021 03/29/2021 02/15/2021 01/18/2021  Decreased Interest 2 0 3 1 3   Down, Depressed, Hopeless 3 3 3 1 3   PHQ - 2 Score 5 3 6 2 6   Altered sleeping 3 3 0 0 2  Tired, decreased energy 3 3 0 0 2  Change in appetite 0 0 0 0 0  Feeling bad or failure about yourself  2 2 2 3 2   Trouble concentrating 1 2 2  0 -  Moving slowly or fidgety/restless 0 0 0 1  0  Suicidal thoughts 2 1 2 1 1   PHQ-9 Score 16 14 12 7 13   Difficult doing work/chores Very difficult Very difficult Somewhat difficult Somewhat difficult Very difficult    Flowsheet Row Video Visit from 08/09/2021 in [facility] Video Visit from 07/05/2021 in [facility] Video Visit from 03/29/2021 in [facility]  [facility] RISK CATEGORY Error: Q3, 4, or 5 should not be populated when Q2 is No Error: Q3, 4, or 5 should not be populated when Q2 is No Error: Q7 should not be  populated when Q6 is No      - referred for therapy  - increase Effexor XR 150mg  po qD for mood and anxiety  - increase Vistaril to 50mg  qHS to target sleep  1. Panic disorder - hydrOXYzine (ATARAX/VISTARIL) 50 MG tablet; Take 1 tablet (50 mg total) by mouth at bedtime.  Dispense: 90 tablet; Refill: 0 - LORazepam (ATIVAN) 0.5 MG tablet; Take 1 tablet (0.5 mg total) by mouth 2 (two) times daily as needed for anxiety.  Dispense: 60 tablet; Refill: 1 - venlafaxine XR (EFFEXOR XR) 150 MG 24 hr capsule; Take 1 capsule (150 mg total) by mouth daily.  Dispense: 90 capsule; Refill: 0  2. Insomnia, unspecified type - hydrOXYzine (ATARAX/VISTARIL) 50 MG tablet; Take 1 tablet (50 mg total) by mouth at bedtime.  Dispense: 90 tablet; Refill: 0  3. GAD (generalized anxiety disorder) - LORazepam (ATIVAN) 0.5 MG tablet; Take 1 tablet (0.5 mg total) by mouth 2 (two) times daily as needed for anxiety.  Dispense: 60 tablet; Refill: 1 - venlafaxine XR (EFFEXOR XR) 150 MG 24 hr capsule; Take 1 capsule (150 mg total) by mouth daily.  Dispense: 90 capsule; Refill: 0  4. Bipolar I disorder, most recent episode depressed (HCC) - OLANZapine (ZYPREXA) 15 MG tablet; Take 2 tablets (30 mg total) by mouth at bedtime.  Dispense: 180 tablet; Refill: 0 - venlafaxine XR (EFFEXOR XR) 150 MG 24 hr capsule; Take 1 capsule (150 mg total) by mouth daily.  Dispense: 90 capsule; Refill: 0    Follow Up Instructions: In 2-3 months or sooner if needed   I discussed the assessment and treatment plan with the patient. The patient was provided an opportunity to ask questions and all were answered. The patient agreed with the plan and demonstrated an understanding of the instructions.   The patient was advised to call back or seek an in-person evaluation if the symptoms worsen or if the condition fails to improve as anticipated.  I provided 16 minutes of non-face-to-face time during this encounter.   09/04/2021, MD

## 2021-08-15 ENCOUNTER — Other Ambulatory Visit (HOSPITAL_COMMUNITY): Payer: Self-pay | Admitting: Psychiatry

## 2021-10-04 ENCOUNTER — Telehealth (HOSPITAL_COMMUNITY): Payer: Commercial Managed Care - PPO | Admitting: Psychiatry

## 2021-10-22 ENCOUNTER — Other Ambulatory Visit (HOSPITAL_COMMUNITY): Payer: Self-pay | Admitting: Psychiatry

## 2021-10-22 DIAGNOSIS — G4700 Insomnia, unspecified: Secondary | ICD-10-CM

## 2021-10-22 DIAGNOSIS — F410 Panic disorder [episodic paroxysmal anxiety] without agoraphobia: Secondary | ICD-10-CM

## 2021-10-24 ENCOUNTER — Other Ambulatory Visit (HOSPITAL_COMMUNITY): Payer: Self-pay | Admitting: Psychiatry

## 2021-10-24 DIAGNOSIS — F410 Panic disorder [episodic paroxysmal anxiety] without agoraphobia: Secondary | ICD-10-CM

## 2021-10-24 DIAGNOSIS — F411 Generalized anxiety disorder: Secondary | ICD-10-CM

## 2021-10-24 DIAGNOSIS — F3130 Bipolar disorder, current episode depressed, mild or moderate severity, unspecified: Secondary | ICD-10-CM

## 2021-10-26 ENCOUNTER — Other Ambulatory Visit (HOSPITAL_COMMUNITY): Payer: Self-pay | Admitting: Psychiatry

## 2021-10-26 DIAGNOSIS — F410 Panic disorder [episodic paroxysmal anxiety] without agoraphobia: Secondary | ICD-10-CM

## 2021-10-26 DIAGNOSIS — G4700 Insomnia, unspecified: Secondary | ICD-10-CM

## 2021-11-01 ENCOUNTER — Other Ambulatory Visit: Payer: Self-pay

## 2021-11-01 ENCOUNTER — Telehealth (HOSPITAL_COMMUNITY): Payer: Commercial Managed Care - PPO | Admitting: Psychiatry

## 2021-11-01 ENCOUNTER — Telehealth (HOSPITAL_COMMUNITY): Payer: Self-pay | Admitting: Psychiatry

## 2021-11-01 NOTE — Telephone Encounter (Signed)
I called the patient at our scheduled appointment time. There was no answer. I was  unable to leave a voice message for patient as the mailbox was full.

## 2021-11-07 ENCOUNTER — Other Ambulatory Visit (HOSPITAL_COMMUNITY): Payer: Self-pay | Admitting: Psychiatry

## 2021-11-07 DIAGNOSIS — F3130 Bipolar disorder, current episode depressed, mild or moderate severity, unspecified: Secondary | ICD-10-CM

## 2021-11-07 DIAGNOSIS — F411 Generalized anxiety disorder: Secondary | ICD-10-CM

## 2021-11-07 DIAGNOSIS — F410 Panic disorder [episodic paroxysmal anxiety] without agoraphobia: Secondary | ICD-10-CM

## 2021-11-07 DIAGNOSIS — G4700 Insomnia, unspecified: Secondary | ICD-10-CM

## 2021-11-08 ENCOUNTER — Telehealth (HOSPITAL_COMMUNITY): Payer: Self-pay | Admitting: *Deleted

## 2021-11-08 ENCOUNTER — Other Ambulatory Visit (HOSPITAL_COMMUNITY): Payer: Self-pay | Admitting: *Deleted

## 2021-11-08 DIAGNOSIS — F411 Generalized anxiety disorder: Secondary | ICD-10-CM

## 2021-11-08 DIAGNOSIS — F3130 Bipolar disorder, current episode depressed, mild or moderate severity, unspecified: Secondary | ICD-10-CM

## 2021-11-08 DIAGNOSIS — F410 Panic disorder [episodic paroxysmal anxiety] without agoraphobia: Secondary | ICD-10-CM

## 2021-11-08 MED ORDER — VENLAFAXINE HCL ER 150 MG PO CP24: 150.0000 mg | ORAL_CAPSULE | Freq: Every day | ORAL | 0 refills | Status: DC

## 2021-11-08 NOTE — Telephone Encounter (Signed)
Pt called requesting refill of Effexor 150 XR. Pt no showed last appointment but now has an appointment on 11/15/21. Please review. Ok to send bridge?

## 2021-11-15 ENCOUNTER — Telehealth (HOSPITAL_BASED_OUTPATIENT_CLINIC_OR_DEPARTMENT_OTHER): Payer: Commercial Managed Care - PPO | Admitting: Psychiatry

## 2021-11-15 ENCOUNTER — Other Ambulatory Visit: Payer: Self-pay

## 2021-11-15 DIAGNOSIS — G4700 Insomnia, unspecified: Secondary | ICD-10-CM

## 2021-11-15 DIAGNOSIS — F410 Panic disorder [episodic paroxysmal anxiety] without agoraphobia: Secondary | ICD-10-CM | POA: Diagnosis not present

## 2021-11-15 DIAGNOSIS — F411 Generalized anxiety disorder: Secondary | ICD-10-CM

## 2021-11-15 DIAGNOSIS — F3130 Bipolar disorder, current episode depressed, mild or moderate severity, unspecified: Secondary | ICD-10-CM | POA: Diagnosis not present

## 2021-11-15 DIAGNOSIS — Z79899 Other long term (current) drug therapy: Secondary | ICD-10-CM

## 2021-11-15 MED ORDER — OLANZAPINE 15 MG PO TABS: 30.0000 mg | ORAL_TABLET | Freq: Every day | ORAL | 0 refills | Status: DC

## 2021-11-15 MED ORDER — LORAZEPAM 0.5 MG PO TABS
0.5000 mg | ORAL_TABLET | Freq: Two times a day (BID) | ORAL | 2 refills | Status: DC | PRN
Start: 2021-11-15 — End: 2022-02-28

## 2021-11-15 MED ORDER — VENLAFAXINE HCL ER 150 MG PO CP24: 150.0000 mg | ORAL_CAPSULE | Freq: Every day | ORAL | 0 refills | Status: DC

## 2021-11-15 MED ORDER — HYDROXYZINE HCL 50 MG PO TABS: 50.0000 mg | ORAL_TABLET | Freq: Every day | ORAL | 0 refills | Status: DC

## 2021-11-15 NOTE — Progress Notes (Signed)
.Virtual Visit via Video Note  I connected with Kelly Beasley on 11/15/21 at  1:15 PM EST by a video enabled telemedicine application and verified that I am speaking with the correct person using two identifiers.  Location: Patient: work Provider: office   I discussed the limitations of evaluation and management by telemedicine and the availability of in person appointments. The patient expressed understanding and agreed to proceed.  History of Present Illness: I am doing better. Her mood is improving and she is having less mood swings. Her depression is decreased. It occurs 3-4 days/week. It is less severe and she no longer wants to spend all her time in bed. She is now sleeping about 6 hrs/night and her energy is mildly improved. The Vistaril 50mg  is really helping and she denies SE. Her appetite is good and she is always snacking. Her anxiety is slowly improving and she takes Ativan when it starts to spike and it calms her. She has passive thoughts of death 3-4x/week but denies SI/HI. She tries to distract herself with reading, watching tv or sleeping and taking Ativan to relax. The anhedonia and hopelessness are less intense. Pt denies recent manic and hypomanic symptoms including periods of decreased need for sleep, increased energy, mood lability, impulsivity, FOI, and excessive spending.    Observations/Objective: Psychiatric Specialty Exam: ROS  There were no vitals taken for this visit.There is no height or weight on file to calculate BMI.  General Appearance: Neat and Well Groomed  Eye Contact:  Good  Speech:  Clear and Coherent and Normal Rate  Volume:  Normal  Mood:  Anxious and Depressed  Affect:  Full Range  Thought Process:  Goal Directed, Linear, and Descriptions of Associations: Intact  Orientation:  Full (Time, Place, and Person)  Thought Content:  Logical  Suicidal Thoughts:  No  Homicidal Thoughts:  No  Memory:  Immediate;   Good  Judgement:  Good  Insight:  Good   Psychomotor Activity:  Normal  Concentration:  Concentration: Good  Recall:  Good  Fund of Knowledge:  Good  Language:  Good  Akathisia:  No  Handed:  Right  AIMS (if indicated):     Assets:  Communication Skills Desire for Improvement Financial Resources/Insurance Housing Resilience Social Support Talents/Skills Transportation Vocational/Educational  ADL's:  Intact  Cognition:  WNL  Sleep:        Assessment and Plan: Depression screen [facility] 2/9 11/15/2021 08/09/2021 07/05/2021 03/29/2021 02/15/2021  Decreased Interest 1 2 0 3 1  Down, Depressed, Hopeless 1 3 3 3 1   PHQ - 2 Score 2 5 3 6 2   Altered sleeping 0 3 3 0 0  Tired, decreased energy 0 3 3 0 0  Change in appetite 3 0 0 0 0  Feeling bad or failure about yourself  1 2 2 2 3   Trouble concentrating 1 1 2 2  0  Moving slowly or fidgety/restless 0 0 0 0 1  Suicidal thoughts 1 2 1 2 1   PHQ-9 Score 8 16 14 12 7   Difficult doing work/chores Somewhat difficult Very difficult Very difficult Somewhat difficult Somewhat difficult   Flowsheet Row [facility]/15[facility]21 [facility] Video Visit from 08/09/2021 in [facility] Video Visit from 07[facility]05/2021 in [facility]  C-SSRS RISK CATEGORY Error: Q3, 4, or 5 should not be populated when Q2 is No Error: Q3, 4, or 5 should not be populated when Q2 is No Error: Q3,  4, or 5 should not be populated when Q2 is No         EKG will be done at PCP office   1. Bipolar I disorder, most recent episode depressed (HCC) - OLANZapine (ZYPREXA) 15 MG tablet; Take 2 tablets (30 mg total) by mouth at bedtime.  Dispense: 180 tablet; Refill: 0 - venlafaxine XR (EFFEXOR XR) 150 MG 24 hr capsule; Take 1 capsule (150 mg total) by mouth daily.  Dispense: 90 capsule; Refill: 0  2. GAD (generalized anxiety disorder) - LORazepam (ATIVAN) 0.5 MG tablet; Take 1 tablet (0.5 mg total)  by mouth 2 (two) times daily as needed for anxiety.  Dispense: 60 tablet; Refill: 2 - venlafaxine XR (EFFEXOR XR) 150 MG 24 hr capsule; Take 1 capsule (150 mg total) by mouth daily.  Dispense: 90 capsule; Refill: 0  3. Insomnia, unspecified type - hydrOXYzine (ATARAX) 50 MG tablet; Take 1 tablet (50 mg total) by mouth at bedtime.  Dispense: 90 tablet; Refill: 0  4. Panic disorder - hydrOXYzine (ATARAX) 50 MG tablet; Take 1 tablet (50 mg total) by mouth at bedtime.  Dispense: 90 tablet; Refill: 0 - LORazepam (ATIVAN) 0.5 MG tablet; Take 1 tablet (0.5 mg total) by mouth 2 (two) times daily as needed for anxiety.  Dispense: 60 tablet; Refill: 2 - venlafaxine XR (EFFEXOR XR) 150 MG 24 hr capsule; Take 1 capsule (150 mg total) by mouth daily.  Dispense: 90 capsule; Refill: 0  5. Long term current use of antipsychotic medication - CBC - TSH - Lipid panel - Comprehensive metabolic panel - Hemoglobin A1c - Prolactin    Follow Up Instructions: In 2-3 months or sooner if needed   I discussed the assessment and treatment plan with the patient. The patient was provided an opportunity to ask questions and all were answered. The patient agreed with the plan and demonstrated an understanding of the instructions.   The patient was advised to call back or seek an in-person evaluation if the symptoms worsen or if the condition fails to improve as anticipated.  I provided 10 minutes of non-face-to-face time during this encounter.   Oletta Darter, MD

## 2021-11-26 ENCOUNTER — Other Ambulatory Visit (HOSPITAL_COMMUNITY): Payer: Self-pay | Admitting: Psychiatry

## 2021-11-26 DIAGNOSIS — F411 Generalized anxiety disorder: Secondary | ICD-10-CM

## 2021-11-26 DIAGNOSIS — F410 Panic disorder [episodic paroxysmal anxiety] without agoraphobia: Secondary | ICD-10-CM

## 2021-11-26 DIAGNOSIS — F3130 Bipolar disorder, current episode depressed, mild or moderate severity, unspecified: Secondary | ICD-10-CM

## 2021-12-06 ENCOUNTER — Other Ambulatory Visit (HOSPITAL_COMMUNITY): Payer: Self-pay | Admitting: Psychiatry

## 2021-12-06 DIAGNOSIS — F410 Panic disorder [episodic paroxysmal anxiety] without agoraphobia: Secondary | ICD-10-CM

## 2021-12-06 DIAGNOSIS — F3130 Bipolar disorder, current episode depressed, mild or moderate severity, unspecified: Secondary | ICD-10-CM

## 2021-12-06 DIAGNOSIS — F411 Generalized anxiety disorder: Secondary | ICD-10-CM

## 2022-02-01 ENCOUNTER — Other Ambulatory Visit (HOSPITAL_COMMUNITY): Payer: Self-pay | Admitting: Psychiatry

## 2022-02-01 DIAGNOSIS — F410 Panic disorder [episodic paroxysmal anxiety] without agoraphobia: Secondary | ICD-10-CM

## 2022-02-01 DIAGNOSIS — F411 Generalized anxiety disorder: Secondary | ICD-10-CM

## 2022-02-01 DIAGNOSIS — F3130 Bipolar disorder, current episode depressed, mild or moderate severity, unspecified: Secondary | ICD-10-CM

## 2022-02-07 ENCOUNTER — Telehealth (HOSPITAL_COMMUNITY): Payer: Commercial Managed Care - PPO | Admitting: Psychiatry

## 2022-02-21 ENCOUNTER — Other Ambulatory Visit (HOSPITAL_COMMUNITY): Payer: Self-pay | Admitting: Psychiatry

## 2022-02-21 DIAGNOSIS — F410 Panic disorder [episodic paroxysmal anxiety] without agoraphobia: Secondary | ICD-10-CM

## 2022-02-21 DIAGNOSIS — G4700 Insomnia, unspecified: Secondary | ICD-10-CM

## 2022-02-28 ENCOUNTER — Telehealth (HOSPITAL_BASED_OUTPATIENT_CLINIC_OR_DEPARTMENT_OTHER): Payer: Commercial Managed Care - PPO | Admitting: Psychiatry

## 2022-02-28 DIAGNOSIS — F3130 Bipolar disorder, current episode depressed, mild or moderate severity, unspecified: Secondary | ICD-10-CM | POA: Diagnosis not present

## 2022-02-28 DIAGNOSIS — F410 Panic disorder [episodic paroxysmal anxiety] without agoraphobia: Secondary | ICD-10-CM | POA: Diagnosis not present

## 2022-02-28 DIAGNOSIS — G4700 Insomnia, unspecified: Secondary | ICD-10-CM

## 2022-02-28 DIAGNOSIS — F411 Generalized anxiety disorder: Secondary | ICD-10-CM | POA: Diagnosis not present

## 2022-02-28 MED ORDER — LORAZEPAM 0.5 MG PO TABS: 0.5000 mg | ORAL_TABLET | Freq: Two times a day (BID) | ORAL | 0 refills | Status: DC | PRN

## 2022-02-28 MED ORDER — VENLAFAXINE HCL ER 75 MG PO CP24: 225.0000 mg | ORAL_CAPSULE | Freq: Every day | ORAL | 0 refills | Status: DC

## 2022-02-28 MED ORDER — HYDROXYZINE HCL 50 MG PO TABS: 100.0000 mg | ORAL_TABLET | Freq: Every evening | ORAL | 0 refills | Status: DC | PRN

## 2022-02-28 MED ORDER — OLANZAPINE 15 MG PO TABS: 30.0000 mg | ORAL_TABLET | Freq: Every day | ORAL | 0 refills | Status: DC

## 2022-02-28 NOTE — Progress Notes (Signed)
Virtual Visit via Phone Note ? ?I connected with Kelly Beasley on 02/28/22 at 11:15 AM EDT by phone and verified that I am speaking with the correct person using two identifiers. She was unable to connect to [place] and the text and email links did not come so we opted to continue by phone.  ? ?Location: ?Patient: at work  ?Provider: office ?  ?I discussed the limitations of evaluation and management by telemedicine and the availability of in person appointments. The patient expressed understanding and agreed to proceed. ? ?History of Present Illness: ?Kelly Beasley shares that her anxiety is getting worse and is causing insomnia. She is getting 2-3 hrs of broken sleep each night even with Vistaril. Her energy is low due to poor sleep. Kelly Beasley feels anxious most days. She has racing thoughts and inability to relax. Kelly Beasley has been having some panic attacks but they are not increased in frequency or severity. She takes Ativan 1-2x/week and it helps to calm her.  She is also a little more depressed. She feels depressed every other day and nothing is enjoyable anymore. Her appetite is poor and she is only eating 2 meals/day. Her focus and concentration are poor and she described it as being "scatter brained". Kelly Beasley has been very critical of herself. She denies passive thoughts of death and HI. She has random, fleeting SI without plan or intent on days she is feeling depressed. The last time was a few days. She states she would never commit suicide. Pt recent hypomanic symptoms including periods of decreased need for sleep for the last 3 days and has decreased energy but it still doing things. She denies mood lability, impulsivity, FOI, and excessive spending. ? ? ? ?Observations/Objective: ?Psychiatric Specialty Exam: ? ?General Appearance: unable to assess  ?Eye Contact:  unable to assess  ?Speech:  Clear and Coherent and Normal Rate  ?Volume:  Normal  ?Mood:  Anxious and Depressed  ?Affect:  Full Range  ?Thought Process:   Goal Directed, Linear, and Descriptions of Associations: Intact  ?Orientation:  Full (Time, Place, and Person)  ?Thought Content:  Logical  ?Suicidal Thoughts:  No  ?Homicidal Thoughts:  No  ?Memory:  Immediate;   Good  ?Judgement:  Good  ?Insight:  Good  ?Psychomotor Activity: unable to assess  ?Concentration:  Concentration: Good  ?Recall:  Good  ?Fund of Knowledge:  Good  ?Language:  Good  ?Akathisia:  unable to assess  ?Handed:  unable to assess  ?AIMS (if indicated):     ?Assets:  Communication Skills ?Desire for Improvement ?Financial Resources/Insurance ?Housing ?Resilience ?Social Support ?Talents/Skills ?Transportation ?Vocational/Educational  ?ADL's:  unable to assess  ?Cognition:  WNL  ?Sleep:     ? ? ? ? ?Assessment and Plan: ? ? ?  02/28/2022  ? 11:46 AM 11/15/2021  ?  1:31 PM 08/09/2021  ?  9:10 AM 07/05/2021  ?  3:31 PM 03/29/2021  ?  3:13 PM  ?Depression screen PHQ 2/9  ?Decreased Interest 3 1 2  0 3  ?Down, Depressed, Hopeless 2 1 3 3 3   ?PHQ - 2 Score 5 2 5 3 6   ?Altered sleeping 3 0 3 3 0  ?Tired, decreased energy 3 0 3 3 0  ?Change in appetite 3 3 0 0 0  ?Feeling bad or failure about yourself  2 1 2 2 2   ?Trouble concentrating 2 1 1 2 2   ?Moving slowly or fidgety/restless 0 0 0 0 0  ?Suicidal thoughts 0 1 2 1 2   ?  PHQ-9 Score 18 8 16 14 12   ?Difficult doing work/chores Very difficult Somewhat difficult Very difficult Very difficult Somewhat difficult  ? ? ?Flowsheet Row Video Visit [facility] 02/28/2022 in [facility] Video Visit from 08/09/2021 in [facility]  ?C-SSRS RISK CATEGORY Low Risk Error: Q3, 4, or 5 should not be populated when Q2 is No Error: Q3, 4, or 5 should not be populated when Q2 is No  ? ?  ? ? ? ? ?Status of current problems: worsening mood and anxiety ? ?Meds:  ?Recommend she takes Ativan BID for the next 1-2 weeks to decrease  hypomanic like symptoms. ? ?Increase Vistaril 100mg  po qHS for sleep ? ?Increase Effexor XR 225mg  po qD for depression and anxiety ? ?1. Bipolar I disorder, most recent episode depressed (HCC) ?- OLANZapine (ZYPREXA) 15 MG tablet; Take 2 tablets (30 mg total) by mouth at bedtime.  Dispense: 180 tablet; Refill: 0 ?- venlafaxine XR (EFFEXOR XR) 75 MG 24 hr capsule; Take 3 capsules (225 mg total) by mouth daily.  Dispense: 270 capsule; Refill: 0 ? ?2. GAD (generalized anxiety disorder) ?- LORazepam (ATIVAN) 0.5 MG tablet; Take 1 tablet (0.5 mg total) by mouth 2 (two) times daily as needed for anxiety.  Dispense: 60 tablet; Refill: 0 ?- venlafaxine XR (EFFEXOR XR) 75 MG 24 hr capsule; Take 3 capsules (225 mg total) by mouth daily.  Dispense: 270 capsule; Refill: 0 ? ?3. Insomnia, unspecified type ?- hydrOXYzine (ATARAX) 50 MG tablet; Take 2 tablets (100 mg total) by mouth at bedtime as needed for anxiety (insomnia).  Dispense: 180 tablet; Refill: 0 ? ?4. Panic disorder ?- hydrOXYzine (ATARAX) 50 MG tablet; Take 2 tablets (100 mg total) by mouth at bedtime as needed for anxiety (insomnia).  Dispense: 180 tablet; Refill: 0 ?- LORazepam (ATIVAN) 0.5 MG tablet; Take 1 tablet (0.5 mg total) by mouth 2 (two) times daily as needed for anxiety.  Dispense: 60 tablet; Refill: 0 ?- venlafaxine XR (EFFEXOR XR) 75 MG 24 hr capsule; Take 3 capsules (225 mg total) by mouth daily.  Dispense: 270 capsule; Refill: 0 ? ? ? ?Therapy: brief supportive therapy provided. Discussed psychosocial stressors in detail.   ? ? ?Collaboration of Care: Other none today ? ?Patient/Guardian was advised Release of Information must be obtained prior to any record release in order to collaborate their care with an outside provider. Patient/Guardian was advised if they have not already done so to contact the registration department to sign all necessary forms in order for us to release information regarding their care.  ? ?Consent: Patient/Guardian gives  verbal consent for treatment and assignment of benefits for services provided during this visit. Patient/Guardian expressed understanding and agreed to proceed.  ? ? ?Pt's acute risk factors for suicide are fleeting, passive SI without plan or intent. Pt's chronic risk factors are chronic SI, ongoing depression symptoms. Pt's protective factors are compliance with meds. Pt  is at an acute low risk for suicide. Patient told to call clinic if any problems occur. Patient advised to go to ER if they should develop SI/HI, side effects, or if symptoms worsen. Pt has crisis numbers to call if needed. Pt acknowledged and agreed with plan and verbalized understanding. ? ?Follow Up Instructions: ?Follow up in 1-2 weeks or sooner if needed ?  ? ?I discussed the assessment and treatment plan with the patient. The patient was provided an opportunity to ask  questions and all were answered. The patient agreed with the plan and demonstrated an understanding of the instructions. ?  ?The patient was advised to call back or seek an in-person evaluation if the symptoms worsen or if the condition fails to improve as anticipated. ? ?I provided 17 minutes of non-face-to-face time during this encounter. ? ? ?Oletta Darter, MD ? ?

## 2022-03-14 ENCOUNTER — Telehealth (HOSPITAL_BASED_OUTPATIENT_CLINIC_OR_DEPARTMENT_OTHER): Payer: Commercial Managed Care - PPO | Admitting: Psychiatry

## 2022-03-14 DIAGNOSIS — F3130 Bipolar disorder, current episode depressed, mild or moderate severity, unspecified: Secondary | ICD-10-CM

## 2022-03-14 DIAGNOSIS — F410 Panic disorder [episodic paroxysmal anxiety] without agoraphobia: Secondary | ICD-10-CM

## 2022-03-14 DIAGNOSIS — F411 Generalized anxiety disorder: Secondary | ICD-10-CM | POA: Diagnosis not present

## 2022-03-14 DIAGNOSIS — G4700 Insomnia, unspecified: Secondary | ICD-10-CM | POA: Diagnosis not present

## 2022-03-14 MED ORDER — LORAZEPAM 0.5 MG PO TABS: 0.5000 mg | ORAL_TABLET | Freq: Two times a day (BID) | ORAL | 0 refills | Status: DC | PRN

## 2022-03-14 NOTE — Progress Notes (Signed)
Virtual Visit via Video  Note ? ?I connected with Kelly Beasley on 03/14/22 at  8:15 AM EDT by a video enabled telemedicine application and verified that I am speaking with the correct person using two identifiers. ? ?Location: ?Patient: home ?Provider: office ?  ?I discussed the limitations of evaluation and management by telemedicine and the availability of in person appointments. The patient expressed understanding and agreed to proceed. ? ?History of Present Illness: ?"I am doing a little better". Kelly Beasley is sleeping better and the last few nights she was able to sleep 6 hrs with Vistaril. Prior to that she was sleeping about 4 hrs/night. Her energy level is decent and she has been going to work. She denies mood lability and impulsivity. Her depression is slowly improving. Random days during the week she is very depressed. Her desire is to isolate but she forces herself to go to work. Negative self thoughts have decreased in frequency and intensity. She sometimes has passive thoughts of death without plan or intent that are fleeting. The last time was yesterday. It helps to get out the house and be distracted. Her anxiety is improving. She is able to concentrate and she is no longer having racing thoughts. She denies HI. The meds seem to be helping. She has been taking Ativan BID for the last 2 weeks. Kelly Beasley denies any panic attacks in the last 2 weeks.  ?  ?Observations/Objective: ?Psychiatric Specialty Exam: ?ROS  ?There were no vitals taken for this visit.There is no height or weight on file to calculate BMI.  ?General Appearance: Casual  ?Eye Contact:  Good  ?Speech:  Clear and Coherent and Normal Rate  ?Volume:  Normal  ?Mood:  Depressed  ?Affect:  Constricted  ?Thought Process:  Goal Directed, Linear, and Descriptions of Associations: Intact  ?Orientation:  Full (Time, Place, and Person)  ?Thought Content:  Logical  ?Suicidal Thoughts:  No  ?Homicidal Thoughts:  No  ?Memory:  Immediate;   Good  ?Judgement:   Good  ?Insight:  Good  ?Psychomotor Activity:  Normal  ?Concentration:  Concentration: Good  ?Recall:  Good  ?Fund of Knowledge:  Good  ?Language:  Good  ?Akathisia:  No  ?Handed:  Right  ?AIMS (if indicated):     ?Assets:  Communication Skills ?Desire for Improvement ?Financial Resources/Insurance ?Housing ?Resilience ?Social Support ?Talents/Skills ?Transportation ?Vocational/Educational  ?ADL's:  Intact  ?Cognition:  WNL  ?Sleep:     ? ? ? ?Assessment and Plan: ? ? ?  03/14/2022  ?  8:24 AM 02/28/2022  ? 11:46 AM 11/15/2021  ?  1:31 PM 08/09/2021  ?  9:10 AM 07/05/2021  ?  3:31 PM  ?Depression screen PHQ 2/9  ?Decreased Interest 1 3 1 2  0  ?Down, Depressed, Hopeless 1 2 1 3 3   ?PHQ - 2 Score 2 5 2 5 3   ?Altered sleeping 1 3 0 3 3  ?Tired, decreased energy 1 3 0 3 3  ?Change in appetite 0 3 3 0 0  ?Feeling bad or failure about yourself  1 2 1 2 2   ?Trouble concentrating 0 2 1 1 2   ?Moving slowly or fidgety/restless 0 0 0 0 0  ?Suicidal thoughts 0 0 1 2 1   ?PHQ-9 Score 5 18 8 16 14   ?Difficult doing work/chores Somewhat difficult Very difficult Somewhat difficult Very difficult Very difficult  ? ? ?Flowsheet Row Video Visit from 03/14/2022 in [facility] Video Visit from 02/28/[facility]22 [facility] Video Visit from 11[facility]15/2021 in [facility]  ?C-SSRS RISK CATEGORY Low Risk Low Risk Error: Q3, 4, or 5 should not be populated when Q2 is No  ? ?  ? ? ? ? ? ?Status of current problems: improving ? ?Meds:  ?1. Bipolar I disorder, most recent episode depressed (HCC) ? ?2. GAD (generalized anxiety disorder) ?- LORazepam (ATIVAN) 0.5 MG tablet; Take 1 tablet (0.5 mg total) by mouth 2 (two) times daily as needed for anxiety.  Dispense: 60 tablet; Refill: 0 ? ?3. Insomnia, unspecified type ? ?4. Panic disorder ?- LORazepam (ATIVAN) 0.5 MG tablet; Take 1 tablet (0.5 mg total) by mouth 2 (two) times daily as needed for  anxiety.  Dispense: 60 tablet; Refill: 0 ? ?continue  ?Zyprexa for mood stabilization ?Effexor XR for mood and anxiety ?Vistaril for sleep ? ?Therapy: brief supportive therapy provided.  ? ? ?Collaboration of Care: Other none today ? ?Patient/Guardian was advised Release of Information must be obtained prior to any record release in order to collaborate their care with an outside provider. Patient/Guardian was advised if they have not already done so to contact the registration department to sign all necessary forms in order for [place] to release information regarding their care.  ? ?Consent: Patient/Guardian gives verbal consent for treatment and assignment of benefits for services provided during this visit. Patient/Guardian expressed understanding and agreed to proceed.  ? ? ?Pt's acute risk factors for suicide are fleeting, passive SI without plan or intent. Pt's chronic risk factors are chronic SI. Pt's protective factors are compliance with meds, improvement in mood, being future oriented, denies history of previous suicide attempts, denies substance abuse. Pt denies SI and is at an acute low risk for suicide. Patient told to call clinic if any problems occur. Patient advised to go to ER if they should develop SI/HI, side effects, or if symptoms worsen. Pt has crisis numbers to call if needed. Pt acknowledged and agreed with plan and verbalized understanding. ? ?Follow Up Instructions: ?Follow up in 1-2 weeks or sooner if needed ?  ? ?I discussed the assessment and treatment plan with the patient. The patient was provided an opportunity to ask questions and all were answered. The patient agreed with the plan and demonstrated an understanding of the instructions. ?  ?The patient was advised to call back or seek an in-person evaluation if the symptoms worsen or if the condition fails to improve as anticipated. ? ?I provided 12 minutes of non-face-to-face time during this encounter. ? ? ?Oletta Darter, MD ? ?

## 2022-04-04 ENCOUNTER — Telehealth (HOSPITAL_COMMUNITY): Payer: Commercial Managed Care - PPO | Admitting: Psychiatry

## 2022-04-11 ENCOUNTER — Telehealth (HOSPITAL_COMMUNITY): Payer: Commercial Managed Care - PPO | Admitting: Psychiatry

## 2022-04-18 ENCOUNTER — Telehealth (HOSPITAL_COMMUNITY): Payer: Commercial Managed Care - PPO | Admitting: Psychiatry

## 2022-04-18 ENCOUNTER — Telehealth (HOSPITAL_COMMUNITY): Payer: Self-pay | Admitting: Psychiatry

## 2022-04-18 NOTE — Telephone Encounter (Signed)
Patient was not present on video platform used through mychart. I called the patient at our scheduled appointment time. There was no answer. I left a voice message for patient to call the clinic back at their convinence.  ? ?

## 2022-05-05 ENCOUNTER — Other Ambulatory Visit (HOSPITAL_COMMUNITY): Payer: Self-pay | Admitting: Psychiatry

## 2022-05-05 DIAGNOSIS — F410 Panic disorder [episodic paroxysmal anxiety] without agoraphobia: Secondary | ICD-10-CM

## 2022-05-05 DIAGNOSIS — F411 Generalized anxiety disorder: Secondary | ICD-10-CM

## 2022-05-05 DIAGNOSIS — F3130 Bipolar disorder, current episode depressed, mild or moderate severity, unspecified: Secondary | ICD-10-CM

## 2022-05-09 ENCOUNTER — Encounter (HOSPITAL_COMMUNITY): Payer: Self-pay | Admitting: Psychiatry

## 2022-05-09 ENCOUNTER — Telehealth (HOSPITAL_BASED_OUTPATIENT_CLINIC_OR_DEPARTMENT_OTHER): Payer: Commercial Managed Care - PPO | Admitting: Psychiatry

## 2022-05-09 DIAGNOSIS — F3130 Bipolar disorder, current episode depressed, mild or moderate severity, unspecified: Secondary | ICD-10-CM

## 2022-05-09 DIAGNOSIS — F411 Generalized anxiety disorder: Secondary | ICD-10-CM | POA: Diagnosis not present

## 2022-05-09 DIAGNOSIS — F410 Panic disorder [episodic paroxysmal anxiety] without agoraphobia: Secondary | ICD-10-CM

## 2022-05-09 DIAGNOSIS — G4700 Insomnia, unspecified: Secondary | ICD-10-CM

## 2022-05-09 MED ORDER — VENLAFAXINE HCL ER 75 MG PO CP24: 225.0000 mg | ORAL_CAPSULE | Freq: Every day | ORAL | 0 refills | Status: DC

## 2022-05-09 MED ORDER — TRAZODONE HCL 100 MG PO TABS: ORAL_TABLET | ORAL | 0 refills | Status: DC

## 2022-05-09 MED ORDER — OLANZAPINE 15 MG PO TABS: 30.0000 mg | ORAL_TABLET | Freq: Every day | ORAL | 0 refills | Status: DC

## 2022-05-09 NOTE — Progress Notes (Signed)
Virtual Visit via Video Note  I connected with [name] on 05/09/22 at  1:15 PM EDT by a video enabled telemedicine application and verified that I am speaking with the correct person using two identifiers.  Location: Patient: home Provider: office   I discussed the limitations of evaluation and management by telemedicine and the availability of in person appointments. The patient expressed understanding and agreed to proceed.  History of Present Illness: "I have not been sleeping well". She has not been sleeping more than 3-4 hrs/night. She has trouble falling and staying asleep even with Vistaril 100mg . Aayat is unable to calm her racing thoughts. It has been going on for about 2 weeks. She is experiencing some new stressors. Her anxiety is getting worse and Ativan is no longer effective. She takes 1 tab daily. is having less frequent panic attacks. She now has 1 random panic attack a week. Her depression is worse. Several days a week she has low motivation and energy. She will isolate in her room after work. She doesn't want to talk or do anything. She has been having passive thoughts of death and denies SI/HI. Shelton wants to live for her kids. Her appetite is decreased. She is having decreased concentration. She has on/off worthlessness. She denies any manic or hypomanic like symptoms.    Observations/Objective: Psychiatric Specialty Exam: ROS  There were no vitals taken for this visit.There is no height or weight on file to calculate BMI.  General Appearance: Casual and Neat  Eye Contact:  Good  Speech:  Clear and Coherent and Normal Rate  Volume:  Normal  Mood:  Depressed  Affect:  Congruent  Thought Process:  Goal Directed, Linear, and Descriptions of Associations: Intact  Orientation:  Full (Time, Place, and Person)  Thought Content:  Logical  Suicidal Thoughts:  No  Homicidal Thoughts:  No  Memory:  Immediate;   Good  Judgement:  Good  Insight:  Good  Psychomotor  Activity:  Normal  Concentration:  Concentration: Good  Recall:  Good  Fund of Knowledge:  Good  Language:  Good  Akathisia:  No  Handed:  Right  AIMS (if indicated):     Assets:  Communication Skills Desire for Improvement Financial Resources/Insurance Housing Resilience Social Support Talents/Skills Transportation Vocational/Educational  ADL's:  Intact  Cognition:  WNL  Sleep:        Assessment and Plan:     05/09/2022    1:24 PM 03/14/2022    8:24 AM 02/28/2022   11:46 AM 11/15/2021    1:31 PM 08/09/2021    9:10 AM  Depression screen PHQ 2/9  Decreased Interest 2 1 3 1 2   Down, Depressed, Hopeless 2 1 2 1 3   PHQ - 2 Score 4 2 5 2 5   Altered sleeping 3 1 3  0 3  Tired, decreased energy 3 1 3  0 3  Change in appetite 2 0 3 3 0  Feeling bad or failure about yourself  2 1 2 1 2   Trouble concentrating 3 0 2 1 1   Moving slowly or fidgety/restless 0 0 0 0 0  Suicidal thoughts 1 0 0 1 2  PHQ-9 Score 18 5 18 8 16   Difficult doing work/chores Extremely dIfficult Somewhat difficult Very difficult Somewhat difficult Very difficult    Flowsheet Row Video Visit [facility] 05/09/2022 in [facility] Video Visit from 03/14/2022 in [facility] Video Visit from 02/28/2022 in [facility]  C-SSRS RISK CATEGORY Error: Q3, 4, or 5 should not be populated when Q2 is No Low Risk Low Risk        Status of current problems: worsening depression and insomnia. Hassie is endorsing a new stressor that is causing insomnia, anxiety and depression symptoms. The lack of sleep is contributing to her worsening depression.   Meds:  D/C -  Ativan- no longer effective. So will taper off with Ativan 0.25mg  po qD for 6 days then stop.  D/c Vistaril  Continue - Zyprexa 30mg  po qHS Effexor XR 225mg  po qD  Start - Trazodone 50-100mg  po qHS prn insomnia to target her sleep.  1. Bipolar I  disorder, most recent episode depressed (HCC) - OLANZapine (ZYPREXA) 15 MG tablet; Take 2 tablets (30 mg total) by mouth at bedtime.  Dispense: 180 tablet; Refill: 0 - venlafaxine XR (EFFEXOR XR) 75 MG 24 hr capsule; Take 3 capsules (225 mg total) by mouth daily.  Dispense: 270 capsule; Refill: 0  2. Insomnia, unspecified type - traZODone (DESYREL) 100 MG tablet; Take 1/2 tab (50mg ) to 100mg  po qHS prn insomnia  Dispense: 90 tablet; Refill: 0  3. Panic disorder - venlafaxine XR (EFFEXOR XR) 75 MG 24 hr capsule; Take 3 capsules (225 mg total) by mouth daily.  Dispense: 270 capsule; Refill: 0  4. GAD (generalized anxiety disorder) - venlafaxine XR (EFFEXOR XR) 75 MG 24 hr capsule; Take 3 capsules (225 mg total) by mouth daily.  Dispense: 270 capsule; Refill: 0     Labs: none today    Therapy: brief supportive therapy provided.    Collaboration of Care: Other none  Patient/Guardian was advised Release of Information must be obtained prior to any record release in order to collaborate their care with an outside provider. Patient/Guardian was advised if they have not already done so to contact the registration department to sign all necessary forms in order for to release information regarding their care.   Consent: Patient/Guardian gives verbal consent for treatment and assignment of benefits for services provided during this visit. Patient/Guardian expressed understanding and agreed to proceed.     Follow Up Instructions: Follow up in 2-4 weeks or sooner if needed    I discussed the assessment and treatment plan with the patient. The patient was provided an opportunity to ask questions and all were answered. The patient agreed with the plan and demonstrated an understanding of the instructions.   The patient was advised to call back or seek an in-person evaluation if the symptoms worsen or if the condition fails to improve as anticipated.  I provided 15 minutes of  non-face-to-face time during this encounter.   , MD

## 2022-05-26 ENCOUNTER — Other Ambulatory Visit (HOSPITAL_COMMUNITY): Payer: Self-pay | Admitting: Psychiatry

## 2022-05-26 DIAGNOSIS — F410 Panic disorder [episodic paroxysmal anxiety] without agoraphobia: Secondary | ICD-10-CM

## 2022-05-26 DIAGNOSIS — G4700 Insomnia, unspecified: Secondary | ICD-10-CM

## 2022-05-30 ENCOUNTER — Telehealth (HOSPITAL_BASED_OUTPATIENT_CLINIC_OR_DEPARTMENT_OTHER): Payer: Commercial Managed Care - PPO | Admitting: Psychiatry

## 2022-05-30 DIAGNOSIS — G4700 Insomnia, unspecified: Secondary | ICD-10-CM | POA: Diagnosis not present

## 2022-05-30 DIAGNOSIS — F411 Generalized anxiety disorder: Secondary | ICD-10-CM | POA: Diagnosis not present

## 2022-05-30 DIAGNOSIS — F410 Panic disorder [episodic paroxysmal anxiety] without agoraphobia: Secondary | ICD-10-CM | POA: Diagnosis not present

## 2022-05-30 DIAGNOSIS — F3130 Bipolar disorder, current episode depressed, mild or moderate severity, unspecified: Secondary | ICD-10-CM

## 2022-05-30 MED ORDER — OLANZAPINE 15 MG PO TABS: 30.0000 mg | ORAL_TABLET | Freq: Every day | ORAL | 0 refills | Status: DC

## 2022-05-30 MED ORDER — TRAZODONE HCL 100 MG PO TABS: 100.0000 mg | ORAL_TABLET | Freq: Every evening | ORAL | 0 refills | Status: DC | PRN

## 2022-05-30 MED ORDER — VENLAFAXINE HCL ER 75 MG PO CP24: 225.0000 mg | ORAL_CAPSULE | Freq: Every day | ORAL | 0 refills | Status: DC

## 2022-05-30 NOTE — Progress Notes (Signed)
Virtual Visit via Phone Note  I connected with Kelly Beasley on 05/30/22 at  1:30 PM EDT by phone. She attempted to connect by a video enabled telemedicine application many times but was unsuccessful. I  verified that I am speaking with the correct person using two identifiers.  Location: Patient: at work Provider: office   I discussed the limitations of evaluation and management by telemedicine and the availability of in person appointments. The patient expressed understanding and agreed to proceed.  History of Present Illness: Laxmi is doing well. She is sleeping thru the night with Trazodone. She states it is the best sleep she has ever had. She is getting 6-7 hrs/night. Harleen denies any SE from Trazodone. She is not as anxious as she used to be. She only gets anxious about 1-2x/week and it is usually due to a stressors. The feeling lasts 5-10 minutes. She denies any recent panic attacks. Pt denies recent manic and hypomanic symptoms including periods of decreased need for sleep, increased energy, mood lability, impulsivity, FOI, and excessive spending. Her depression is improving. She is only feeling depressed about 2-3x/week. On those days she isolates and has low motivation but is able to force herself to go to work. She denies SI/HI.     Observations/Objective: Psychiatric Specialty Exam:  General Appearance: unable to assess  Eye Contact:  unable to assess  Speech:  Clear and Coherent and Normal Rate  Volume:  Normal  Mood:  Euthymic  Affect:  Full Range  Thought Process:  Goal Directed, Linear, and Descriptions of Associations: Intact  Orientation:  Full (Time, Place, and Person)  Thought Content:  Logical  Suicidal Thoughts:  No  Homicidal Thoughts:  No  Memory:  Immediate;   Good  Judgement:  Good  Insight:  Good  Psychomotor Activity: unable to assess  Concentration:  Concentration: Good  Recall:  Good  Fund of Knowledge:  Good  Language:  Good  Akathisia:  unable to  assess  Handed:  unable to assess  AIMS (if indicated):     Assets:  Communication Skills Desire for Improvement Financial Resources/Insurance Housing Leisure Time Resilience Social Support Talents/Skills Transportation Vocational/Educational  ADL's:  unable to assess  Cognition:  WNL  Sleep:         Assessment and Plan:     05/30/2022    1:50 PM 05/09/2022    1:24 PM 03/14/2022    8:24 AM 02/28/2022   11:46 AM 11/15/2021    1:31 PM  Depression screen PHQ 2/9  Decreased Interest 1 2 1 3 1   Down, Depressed, Hopeless 1 2 1 2 1   PHQ - 2 Score 2 4 2 5 2   Altered sleeping 0 3 1 3  0  Tired, decreased energy 0 3 1 3  0  Change in appetite 1 2 0 3 3  Feeling bad or failure about yourself  1 2 1 2 1   Trouble concentrating 1 3 0 2 1  Moving slowly or fidgety/restless 0 0 0 0 0  Suicidal thoughts 0 1 0 0 1  PHQ-9 Score 5 18 5 18 8   Difficult doing work/chores Very difficult Extremely dIfficult Somewhat difficult Very difficult Somewhat difficult    Flowsheet Row Video Visit from 05/30/2022 in [facility] Video Visit from 05/09/2022 in [facility] Video Visit from 03/14/2022 in [facility]  C-SSRS RISK CATEGORY Error: Q3, 4, or 5 should not be populated when Q2 is No Error: Q3,  4, or 5 should not be populated when Q2 is No Low Risk         Status of current problems: improvement in depression and anxiety  Meds:  1. Bipolar I disorder, most recent episode depressed (HCC) - venlafaxine XR (EFFEXOR XR) 75 MG 24 hr capsule; Take 3 capsules (225 mg total) by mouth daily.  Dispense: 270 capsule; Refill: 0 - OLANZapine (ZYPREXA) 15 MG tablet; Take 2 tablets (30 mg total) by mouth at bedtime.  Dispense: 180 tablet; Refill: 0  2. Panic disorder - venlafaxine XR (EFFEXOR XR) 75 MG 24 hr capsule; Take 3 capsules (225 mg total) by mouth daily.  Dispense: 270 capsule;  Refill: 0  3. GAD (generalized anxiety disorder) - venlafaxine XR (EFFEXOR XR) 75 MG 24 hr capsule; Take 3 capsules (225 mg total) by mouth daily.  Dispense: 270 capsule; Refill: 0  4. Insomnia, unspecified type - traZODone (DESYREL) 100 MG tablet; Take 1 tablet (100 mg total) by mouth at bedtime as needed for sleep.  Dispense: 90 tablet; Refill: 0     Labs: none today    Therapy: brief supportive therapy provided.  Reviewed ways of responding to anxiety and depression in a productive manner. She is going to engage coping strategies.     Collaboration of Care: Other none  Patient/Guardian was advised Release of Information must be obtained prior to any record release in order to collaborate their care with an outside provider. Patient/Guardian was advised if they have not already done so to contact the registration department to sign all necessary forms in order for [place] to release information regarding their care.   Consent: Patient/Guardian gives verbal consent for treatment and assignment of benefits for services provided during this visit. Patient/Guardian expressed understanding and agreed to proceed.     Follow Up Instructions: Follow up in 2-3 months or sooner if needed    I discussed the assessment and treatment plan with the patient. The patient was provided an opportunity to ask questions and all were answered. The patient agreed with the plan and demonstrated an understanding of the instructions.   The patient was advised to call back or seek an in-person evaluation if the symptoms worsen or if the condition fails to improve as anticipated.  I provided 11 minutes of non-face-to-face time during this encounter.   Oletta Darter, MD

## 2022-07-07 ENCOUNTER — Other Ambulatory Visit (HOSPITAL_COMMUNITY): Payer: Self-pay | Admitting: Psychiatry

## 2022-07-07 DIAGNOSIS — G4700 Insomnia, unspecified: Secondary | ICD-10-CM

## 2022-07-07 DIAGNOSIS — F410 Panic disorder [episodic paroxysmal anxiety] without agoraphobia: Secondary | ICD-10-CM

## 2022-07-31 ENCOUNTER — Ambulatory Visit (HOSPITAL_COMMUNITY)
Admission: EM | Admit: 2022-07-31 | Discharge: 2022-07-31 | Disposition: A | Discharge status: Home / Self Care | Payer: Commercial Managed Care - PPO | Attending: Family Medicine | Admitting: Family Medicine

## 2022-07-31 ENCOUNTER — Encounter (HOSPITAL_COMMUNITY): Payer: Self-pay | Admitting: Emergency Medicine

## 2022-07-31 DIAGNOSIS — U071 COVID-19: Secondary | ICD-10-CM | POA: Insufficient documentation

## 2022-07-31 DIAGNOSIS — J069 Acute upper respiratory infection, unspecified: Secondary | ICD-10-CM | POA: Diagnosis not present

## 2022-07-31 DIAGNOSIS — J029 Acute pharyngitis, unspecified: Secondary | ICD-10-CM | POA: Diagnosis not present

## 2022-07-31 DIAGNOSIS — R069 Unspecified abnormalities of breathing: Secondary | ICD-10-CM | POA: Insufficient documentation

## 2022-07-31 DIAGNOSIS — Z79899 Other long term (current) drug therapy: Secondary | ICD-10-CM | POA: Insufficient documentation

## 2022-07-31 LAB — RESP PANEL BY RT-PCR (FLU A&B, COVID) ARPGX2
Influenza A by PCR: NEGATIVE
Influenza B by PCR: NEGATIVE
SARS Coronavirus 2 by RT PCR: POSITIVE — AB

## 2022-07-31 MED ORDER — HYDROCODONE BIT-HOMATROP MBR 5-1.5 MG/5ML PO SOLN: 5.0000 mL | Freq: Four times a day (QID) | ORAL | 0 refills | Status: DC | PRN

## 2022-07-31 NOTE — Discharge Instructions (Addendum)
You have been tested for COVID-19 today. °If your test returns positive, you will receive a phone call from [place] regarding your results. °Negative test results are not called. °Both positive and negative results area always visible on MyChart. °If you do not have a MyChart account, sign up instructions are provided in your discharge papers. °Please do not hesitate to contact us should you have questions or concerns. ° °

## 2022-07-31 NOTE — ED Triage Notes (Signed)
Pt reports a severe cough, nasal congestion, sore throat, lost of appetite and spitting up a dark colored mucous since yesterday. Denies taking any OTC medication.

## 2022-07-31 NOTE — ED Provider Notes (Signed)
  [facility]   [id number] 07/31/22 Arrival Time: 0934  ASSESSMENT & PLAN:  1. Viral URI with cough   2. Sore throat    Discussed typical duration of viral illnesses. COVID/flu testing sent. OTC symptom care as needed.  Discharge Medication List as of 07/31/2022 11:55 AM     START taking these medications   Details  HYDROcodone bit-homatropine (HYCODAN) 5-1.5 MG/5ML syrup Take 5 mLs by mouth every 6 (six) hours as needed for cough., Starting Wed 07/31/2022, Normal         Follow-up Information     Care, [facility]   Specialty: Family [facility] Home Why: If worsening or failing to improve as anticipated. Contact information: [address] [place] [address] [phone]                 Reviewed expectations re: course of current medical issues. Questions answered. Outlined signs and symptoms indicating need for more acute intervention. Understanding verbalized. After Visit Summary given.   SUBJECTIVE: History from: Patient. Kelly Beasley is a 51 y.o. female. Reports: cough, nasal congestion, ST, fatigue; x 1 day; abrupt onset. Denies: fever and difficulty breathing. Normal PO intake without n/v/d.  OBJECTIVE:  Vitals:   07/31/22 1109  BP: (!) 141/92  Pulse: (!) 102  Resp: 20  Temp: 98.7 F (37.1 C)  TempSrc: Oral  SpO2: 95%    Slight tachycardia noted.  General appearance: alert; no distress Eyes: PERRLA; EOMI; conjunctiva normal HENT: [place]; AT; with nasal congestion Neck: supple  Lungs: speaks full sentences without difficulty; unlabored; clear; significant coughing Extremities: no edema Skin: warm and dry Neurologic: normal gait Psychological: alert and cooperative; normal mood and affect  Labs:  Labs Reviewed  RESP PANEL BY RT-PCR (FLU A&B, COVID) [id number]     No Known Allergies  Past Medical History:  Diagnosis Date   Anxiety    Bipolar disorder (HCC)    Depression    Social History   Socioeconomic History    Marital status: Divorced    Spouse name: Not on file   Number of children: Not on file   Years of education: Not on file   Highest education level: Not on file  Occupational History   Not on file  Tobacco Use   Smoking status: Never   Smokeless tobacco: Never  Vaping Use   Vaping Use: Never used  Substance and Sexual Activity   Alcohol use: No   Drug use: No   Sexual activity: Never    Birth control/protection: Surgical  Other Topics Concern   Not on file  Social History Narrative   Not on file   Social Determinants of Health   Financial Resource Strain: Not on file  Food Insecurity: Not on file  Transportation Needs: Not on file  Physical Activity: Not on file  Stress: Not on file  Social Connections: Not on file  Intimate Partner Violence: Not on file   Family History  Problem Relation Age of Onset   Cancer Mother    History reviewed. No pertinent surgical history.   Mardella Layman, MD 07/31/22 [id number]

## 2022-08-08 ENCOUNTER — Telehealth (HOSPITAL_COMMUNITY): Payer: Commercial Managed Care - PPO | Admitting: Psychiatry

## 2022-08-13 ENCOUNTER — Ambulatory Visit (HOSPITAL_COMMUNITY)
Admission: EM | Admit: 2022-08-13 | Discharge: 2022-08-13 | Disposition: A | Discharge status: Home / Self Care | Payer: Commercial Managed Care - PPO | Attending: Physician Assistant | Admitting: Physician Assistant

## 2022-08-13 ENCOUNTER — Encounter (HOSPITAL_COMMUNITY): Payer: Self-pay

## 2022-08-13 DIAGNOSIS — J40 Bronchitis, not specified as acute or chronic: Secondary | ICD-10-CM

## 2022-08-13 DIAGNOSIS — R051 Acute cough: Secondary | ICD-10-CM

## 2022-08-13 DIAGNOSIS — J329 Chronic sinusitis, unspecified: Secondary | ICD-10-CM

## 2022-08-13 MED ORDER — PROMETHAZINE-DM 6.25-15 MG/5ML PO SYRP: 5.0000 mL | ORAL_SOLUTION | Freq: Three times a day (TID) | ORAL | 0 refills | Status: DC | PRN

## 2022-08-13 MED ORDER — AMOXICILLIN-POT CLAVULANATE 875-125 MG PO TABS: 1.0000 | ORAL_TABLET | Freq: Two times a day (BID) | ORAL | 0 refills | Status: DC

## 2022-08-13 MED ORDER — PREDNISONE 20 MG PO TABS
40.0000 mg | ORAL_TABLET | Freq: Every day | ORAL | 0 refills | Status: AC
Start: 2022-08-13 — End: 2022-08-18

## 2022-08-13 NOTE — Discharge Instructions (Signed)
I am concerned that you have a bacterial infection.  Start Augmentin twice daily for 7 days.  Take prednisone burst of 40 mg for 4 days.  This should be taken in the morning.  Do not take NSAIDs including aspirin, ibuprofen/Advil, naproxen/Aleve.  You can use Tylenol, Mucinex, Flonase for symptom relief.  Use promethazine DM as needed for cough.  This will make you sleepy do not drive or drink alcohol with taking it.  If your symptoms are not improving please return for reevaluation.  If anything worsens you need to be seen immediately.

## 2022-08-13 NOTE — ED Triage Notes (Signed)
Pt c/o cough, sore throat, headache, and congestion with chills since last Tuesday. States was tested COVID positive. States sx's are still the same.

## 2022-08-13 NOTE — ED Provider Notes (Signed)
[facility]    CSN: [id number] Arrival date & time: 08/13/22  1751      History   Chief Complaint Chief Complaint  Patient presents with   Cough    HPI Kelly Beasley is a 51 y.o. female.   Patient presents today with a several week history of URI symptoms.  She was seen by clinic 07/31/2022 at which point she tested positive for COVID.  She has been using over-the-counter medication as well as prescribed cough medicine without improvement of symptoms.  Continues to have sore throat, headache, congestion, chills, cough.  Denies any chest pain, shortness of breath, nausea, vomiting, fever.  Denies any significant past medical history including allergies, asthma, COPD, smoking.  Denies history of diabetes or immunosuppression.  Denies any recent antibiotic or steroid use.  She has missed work as a result of symptoms.  She is requesting work excuse note today.    Past Medical History:  Diagnosis Date   Anxiety    Bipolar disorder [facility])    Depression     Patient Active Problem List   Diagnosis Date Noted   Bipolar I disorder, most recent episode depressed (HCC) 08/27/2018   Insomnia 08/27/2018    History reviewed. No pertinent surgical history.  OB History   No obstetric history on file.      Home Medications    Prior to Admission medications   Medication Sig Start Date End Date Taking? Authorizing Provider  amoxicillin-clavulanate (AUGMENTIN) 875-125 MG tablet Take 1 tablet by mouth every 12 (twelve) hours. 08/13/22  Yes Arley Salamone K, PA-C  predniSONE (DELTASONE) 20 MG tablet Take 2 tablets (40 mg total) by mouth daily for 5 days. 08/13/22 08/18/22 Yes Khushi Zupko K, PA-C  promethazine-dextromethorphan (PROMETHAZINE-DM) 6.25-15 MG/5ML syrup Take 5 mLs by mouth 3 (three) times daily as needed for cough. 08/13/22  Yes Deforest Maiden K, PA-C  fluticasone (FLONASE) 50 MCG/ACT nasal spray Place 2 sprays into both nostrils daily. 02/16/21   Rushie Chestnut, PA-C   OLANZapine (ZYPREXA) 15 MG tablet Take 2 tablets (30 mg total) by mouth at bedtime. 05/30/22   Oletta Darter, MD  traZODone (DESYREL) 100 MG tablet Take 1 tablet (100 mg total) by mouth at bedtime as needed for sleep. 05/30/22   Oletta Darter, MD  venlafaxine XR (EFFEXOR XR) 75 MG 24 hr capsule Take 3 capsules (225 mg total) by mouth daily. 05/30/22 05/30/23  Oletta Darter, MD    Family History Family History  Problem Relation Age of Onset   Cancer Mother     Social History Social History   Tobacco Use   Smoking status: Never   Smokeless tobacco: Never  Vaping Use   Vaping Use: Never used  Substance Use Topics   Alcohol use: No   Drug use: No     Allergies   Patient has no known allergies.   Review of Systems Review of Systems  Constitutional:  Positive for activity change. Negative for appetite change, fatigue and fever.  HENT:  Positive for congestion and sore throat. Negative for sinus pressure and sneezing.   Respiratory:  Positive for cough. Negative for shortness of breath.   Cardiovascular:  Negative for chest pain.  Gastrointestinal:  Negative for abdominal pain, diarrhea, nausea and vomiting.  Neurological:  Negative for dizziness, light-headedness and headaches.     Physical Exam Triage Vital Signs ED Triage Vitals  Enc Vitals Group     BP 08/13/22 1943 (!) 161/53     Pulse  Rate 08/13/22 1943 (!) 103     Resp 08/13/22 1943 18     Temp 08/13/22 1943 98.2 F (36.8 C)     Temp Source 08/13/22 1943 Oral     SpO2 08/13/22 1943 96 %     Weight --      Height --      Head Circumference --      Peak Flow --      Pain Score 08/13/22 1944 2     Pain Loc --      Pain Edu? --      Excl. in GC? --    No data found.  Updated Vital Signs BP (!) 161/53 (BP Location: Left Arm)   Pulse (!) 103   Temp 98.2 F (36.8 C) (Oral)   Resp 18   SpO2 96%   Visual Acuity Right Eye Distance:   Left Eye Distance:   Bilateral Distance:    Right Eye Near:   Left  Eye Near:    Bilateral Near:     Physical Exam Vitals reviewed.  Constitutional:      General: She is awake. She is not in acute distress.    Appearance: Normal appearance. She is well-developed. She is not ill-appearing.     Comments: Very pleasant female appears stated age in no acute distress sitting comfortably in exam room  HENT:     Head: Normocephalic and atraumatic.     Right Ear: Tympanic membrane, ear canal and external ear normal. Tympanic membrane is not erythematous or bulging.     Left Ear: Tympanic membrane, ear canal and external ear normal. Tympanic membrane is not erythematous or bulging.     Nose:     Right Sinus: Maxillary sinus tenderness present. No frontal sinus tenderness.     Left Sinus: Maxillary sinus tenderness present. No frontal sinus tenderness.     Mouth/Throat:     Pharynx: Uvula midline. Posterior oropharyngeal erythema present. No oropharyngeal exudate.     Comments: Erythema and drainage in posterior oropharynx Cardiovascular:     Rate and Rhythm: Normal rate and regular rhythm.     Heart sounds: Normal heart sounds, S1 normal and S2 normal. No murmur heard. Pulmonary:     Effort: Pulmonary effort is normal.     Breath sounds: Normal breath sounds. No wheezing, rhonchi or rales.     Comments: Clear to auscultation bilaterally Psychiatric:        Behavior: Behavior is cooperative.      UC Treatments / Results  Labs (all labs ordered are listed, but only abnormal results are displayed) Labs Reviewed - No data to display  EKG   Radiology No results found.  Procedures Procedures (including critical care time)  Medications Ordered in UC Medications - No data to display  Initial Impression / Assessment and Plan / UC Course  I have reviewed the triage vital signs and the nursing notes.  Pertinent labs & imaging results that were available during my care of the patient were reviewed by me and considered in my medical decision making (see  chart for details).     Given prolonged and worsening symptoms will cover for secondary bacterial infection.  Patient was started on Augmentin twice daily for 7 days.  Recommended prednisone burst of 40 mg with instruction not to take NSAIDs with this medication.  She can continue over-the-counter medications including Mucinex, Flonase, Tylenol.  She was given Promethazine DM for cough.  Recommended that she rest and drink plenty  of fluid.  Discussed that she should have some improvement of symptoms with medication regimen within 3 to 5 days if it does not improve she should return for reevaluation.  If she has any worsening symptoms she needs to be seen immediately including chest pain, shortness of breath, fever, nausea, vomiting.  Strict return precautions given to which she expressed understanding.  Work excuse note provided.  Final Clinical Impressions(s) / UC Diagnoses   Final diagnoses:  Sinobronchitis  Acute cough     Discharge Instructions      I am concerned that you have a bacterial infection.  Start Augmentin twice daily for 7 days.  Take prednisone burst of 40 mg for 4 days.  This should be taken in the morning.  Do not take NSAIDs including aspirin, ibuprofen/Advil, naproxen/Aleve.  You can use Tylenol, Mucinex, Flonase for symptom relief.  Use promethazine DM as needed for cough.  This will make you sleepy do not drive or drink alcohol with taking it.  If your symptoms are not improving please return for reevaluation.  If anything worsens you need to be seen immediately.     ED Prescriptions     Medication Sig Dispense Auth. Provider   promethazine-dextromethorphan (PROMETHAZINE-DM) 6.25-15 MG/5ML syrup Take 5 mLs by mouth 3 (three) times daily as needed for cough. 118 mL Lively Haberman K, PA-C   predniSONE (DELTASONE) 20 MG tablet Take 2 tablets (40 mg total) by mouth daily for 5 days. 10 tablet Ayo Smoak K, PA-C   amoxicillin-clavulanate (AUGMENTIN) 875-125 MG tablet Take  1 tablet by mouth every 12 (twelve) hours. 14 tablet Naira Standiford, Derry Skill, PA-C      PDMP not reviewed this encounter.   Terrilee Croak, PA-C 08/13/22 1958

## 2022-08-14 ENCOUNTER — Other Ambulatory Visit (HOSPITAL_COMMUNITY): Payer: Self-pay | Admitting: Psychiatry

## 2022-08-14 DIAGNOSIS — G4700 Insomnia, unspecified: Secondary | ICD-10-CM

## 2022-08-14 DIAGNOSIS — F3130 Bipolar disorder, current episode depressed, mild or moderate severity, unspecified: Secondary | ICD-10-CM

## 2022-08-14 DIAGNOSIS — F411 Generalized anxiety disorder: Secondary | ICD-10-CM

## 2022-08-14 DIAGNOSIS — F410 Panic disorder [episodic paroxysmal anxiety] without agoraphobia: Secondary | ICD-10-CM

## 2022-08-15 ENCOUNTER — Telehealth (HOSPITAL_COMMUNITY): Payer: Commercial Managed Care - PPO | Admitting: Psychiatry

## 2022-08-20 ENCOUNTER — Ambulatory Visit: Payer: Commercial Managed Care - PPO | Admitting: Podiatry

## 2022-09-02 ENCOUNTER — Ambulatory Visit (INDEPENDENT_AMBULATORY_CARE_PROVIDER_SITE_OTHER): Payer: Commercial Managed Care - PPO | Admitting: Podiatry

## 2022-09-02 DIAGNOSIS — M79674 Pain in right toe(s): Secondary | ICD-10-CM

## 2022-09-02 DIAGNOSIS — M79675 Pain in left toe(s): Secondary | ICD-10-CM | POA: Diagnosis not present

## 2022-09-02 DIAGNOSIS — B351 Tinea unguium: Secondary | ICD-10-CM | POA: Diagnosis not present

## 2022-09-02 NOTE — Progress Notes (Signed)
   Chief Complaint  Patient presents with   Nail Problem    Patient states that she has bilateral the nail fungus for 2-3 months.that seems to be getting worse.    SUBJECTIVE Patient presents to office today complaining of elongated, thickened nails that cause pain while ambulating in shoes.  Patient is unable to trim their own nails. Patient is here for further evaluation and treatment.  Past Medical History:  Diagnosis Date   Anxiety    Bipolar disorder ([place])    Depression     No Known Allergies   OBJECTIVE General Patient is awake, alert, and oriented x 3 and in no acute distress. Derm Skin is dry and supple bilateral. Negative open lesions or macerations. Remaining integument unremarkable. Nails are tender, long, thickened and dystrophic with subungual debris, consistent with onychomycosis, 1-5 bilateral. No signs of infection noted. Vasc  DP and PT pedal pulses palpable bilaterally. Temperature gradient within normal limits.  Neuro Epicritic and protective threshold sensation grossly intact bilaterally.  Musculoskeletal Exam No symptomatic pedal deformities noted bilateral. Muscular strength within normal limits.  ASSESSMENT 1.  Pain due to onychomycosis of toenails both  PLAN OF CARE 1. Patient evaluated today.  2. Instructed to maintain good pedal hygiene and foot care.  3. Mechanical debridement of nails 1-5 bilaterally performed using a nail nipper. Filed with dremel without incident.  4.  Nail biopsy was performed to the left hallux nail plate and sent to pathology for fungal culture  5.  Return to clinic in 4 weeks to review the nail biopsy results and discussed different treatment options for possible onychomycosis of the toenails   Edrick Kins, DPM [facility]  Dr. Edrick Kins, DPM    [address], [place] [address]                Office [phone]  Fax [phone]

## 2022-09-12 ENCOUNTER — Telehealth (HOSPITAL_BASED_OUTPATIENT_CLINIC_OR_DEPARTMENT_OTHER): Payer: Commercial Managed Care - PPO | Admitting: Psychiatry

## 2022-09-12 DIAGNOSIS — F410 Panic disorder [episodic paroxysmal anxiety] without agoraphobia: Secondary | ICD-10-CM | POA: Diagnosis not present

## 2022-09-12 DIAGNOSIS — F3130 Bipolar disorder, current episode depressed, mild or moderate severity, unspecified: Secondary | ICD-10-CM | POA: Diagnosis not present

## 2022-09-12 DIAGNOSIS — F411 Generalized anxiety disorder: Secondary | ICD-10-CM | POA: Diagnosis not present

## 2022-09-12 DIAGNOSIS — G4700 Insomnia, unspecified: Secondary | ICD-10-CM

## 2022-09-12 MED ORDER — TRAZODONE HCL 100 MG PO TABS: 100.0000 mg | ORAL_TABLET | Freq: Every evening | ORAL | 0 refills | Status: DC | PRN

## 2022-09-12 MED ORDER — BUSPIRONE HCL 5 MG PO TABS: 5.0000 mg | ORAL_TABLET | Freq: Two times a day (BID) | ORAL | 0 refills | Status: DC

## 2022-09-12 MED ORDER — OLANZAPINE 15 MG PO TABS: 30.0000 mg | ORAL_TABLET | Freq: Every day | ORAL | 0 refills | Status: DC

## 2022-09-12 MED ORDER — VENLAFAXINE HCL ER 75 MG PO CP24: 225.0000 mg | ORAL_CAPSULE | Freq: Every day | ORAL | 0 refills | Status: DC

## 2022-09-12 NOTE — Progress Notes (Signed)
Virtual Visit via Video Note  I connected with Kelly Beasley on 09/12/22 at 11:00 AM EDT by   a video enabled telemedicine application and verified that I am speaking with the correct person using two identifiers.  Location: Patient: work Provider: office   I discussed the limitations of evaluation and management by telemedicine and the availability of in person appointments. The patient expressed understanding and agreed to proceed.  History of Present Illness: Kelly Beasley had COVID about 2 weeks ago. She is slowly recovering. Her current medication regime is working well for her. Overall she feels depressed about 3 days/week. On those days she wants to isolate and hide in her room. She spends the day in bed sleeping. It is hard to focus and her appetite is poor. On those days she has passive thoughts of death and that her family would be better off without her. She denies SI/HI and knows her family loves her.  It is random days thru out the week. She will just work thru it. Most days she is sleeping well and her energy is fair. Her appetite is fair on good days. On her days off she tries to do some things she enjoys. She has racing thoughts and anxiety on days she is depressed. Kelly Beasley has mild, stress induced panic attacks about 2x/week. She will mediate and distract herself to get better.    Observations/Objective: Psychiatric Specialty Exam: ROS  There were no vitals taken for this visit.There is no height or weight on file to calculate BMI.  General Appearance: Fairly Groomed and Neat  Eye Contact:  Good  Speech:  Clear and Coherent and Normal Rate  Volume:  Normal  Mood:  Depressed  Affect:  Full Range  Thought Process:  Goal Directed, Linear, and Descriptions of Associations: Intact  Orientation:  Full (Time, Place, and Person)  Thought Content:  Logical  Suicidal Thoughts:  No  Homicidal Thoughts:  No  Memory:  Immediate;   Good  Judgement:  Good  Insight:  Good  Psychomotor  Activity:  Normal  Concentration:  Concentration: Good  Recall:  Good  Fund of Knowledge:  Good  Language:  Good  Akathisia:  No  Handed:  Right  AIMS (if indicated):     Assets:  Communication Skills Desire for Improvement Financial Resources/Insurance Housing Resilience Social Support Talents/Skills Transportation Vocational/Educational  ADL's:  Intact  Cognition:  WNL  Sleep:        Assessment and Plan:     09/12/2022   11:16 AM 05/30/2022    1:50 PM 05/09/2022    1:24 PM 03/14/2022    8:24 AM 02/28/2022   11:46 AM  Depression screen PHQ 2/9  Decreased Interest 1 1 2 1 3   Down, Depressed, Hopeless 1 1 2 1 2   PHQ - 2 Score 2 2 4 2 5   Altered sleeping 1 0 3 1 3   Tired, decreased energy 1 0 3 1 3   Change in appetite 1 1 2  0 3  Feeling bad or failure about yourself  1 1 2 1 2   Trouble concentrating 1 1 3  0 2  Moving slowly or fidgety/restless 0 0 0 0 0  Suicidal thoughts 1 0 1 0 0  PHQ-9 Score 8 5 18 5 18   Difficult doing work/chores Somewhat difficult Very difficult Extremely dIfficult Somewhat difficult Very difficult    Flowsheet Row Video Visit from 09/12/2022 in [facility] from 08/13/2022 in [facility]  from 07/31/2022 in [facility] RISK CATEGORY Error: Q3, 4, or 5 should not be populated when Q2 is No No Risk No Risk       Pt is aware that these meds carry a teratogenic risk. Pt will discuss plan of action if she does or plans to become pregnant in the future.  Status of current problems: ongoing depression and anxiety   Meds: start trial Buspar 5mg  po BID for depression and anxiety 1. Bipolar I disorder, most recent episode depressed (HCC) - busPIRone (BUSPAR) 5 MG tablet; Take 1 tablet (5 mg total) by mouth 2 (two) times daily.  Dispense: 60 tablet; Refill: 0 - OLANZapine (ZYPREXA) 15 MG tablet; Take 2 tablets (30 mg total) by mouth at bedtime.   Dispense: 180 tablet; Refill: 0 - venlafaxine XR (EFFEXOR XR) 75 MG 24 hr capsule; Take 3 capsules (225 mg total) by mouth daily.  Dispense: 270 capsule; Refill: 0  2. Insomnia, unspecified type - traZODone (DESYREL) 100 MG tablet; Take 1 tablet (100 mg total) by mouth at bedtime as needed for sleep.  Dispense: 90 tablet; Refill: 0  3. Panic disorder - venlafaxine XR (EFFEXOR XR) 75 MG 24 hr capsule; Take 3 capsules (225 mg total) by mouth daily.  Dispense: 270 capsule; Refill: 0  4. GAD (generalized anxiety disorder) - busPIRone (BUSPAR) 5 MG tablet; Take 1 tablet (5 mg total) by mouth 2 (two) times daily.  Dispense: 60 tablet; Refill: 0 - venlafaxine XR (EFFEXOR XR) 75 MG 24 hr capsule; Take 3 capsules (225 mg total) by mouth daily.  Dispense: 270 capsule; Refill: 0     Labs: will order labs at next visit    Therapy: brief supportive therapy provided. Discussed psychosocial stressors in detail.      Collaboration of Care: Other none  Patient/Guardian was advised Release of Information must be obtained prior to any record release in order to collaborate their care with an outside provider. Patient/Guardian was advised if they have not already done so to contact the registration department to sign all necessary forms in order for to release information regarding their care.   Consent: Patient/Guardian gives verbal consent for treatment and assignment of benefits for services provided during this visit. Patient/Guardian expressed understanding and agreed to proceed.       Pt's acute risk factors for suicide are passive thoughts of death, ongoing depression symptoms.  Pt's protective factors are sense of responsibility to family, living with family, positive social support, denying SI and denies hx of suicide attempts. Pt denies SI and is at an acute low risk for suicide. Patient told to call clinic if any problems occur. Patient advised to go to ER if they should develop SI/HI, side  effects, or if symptoms worsen. Pt has crisis numbers to call if needed. Pt acknowledged and agreed with plan and verbalized understanding.  Follow Up Instructions: Follow up in 2-3 weeks or sooner if needed    I discussed the assessment and treatment plan with the patient. The patient was provided an opportunity to ask questions and all were answered. The patient agreed with the plan and demonstrated an understanding of the instructions.   The patient was advised to call back or seek an in-person evaluation if the symptoms worsen or if the condition fails to improve as anticipated.  I provided 13 minutes of non-face-to-face time during this encounter.   [place], MD

## 2022-09-30 ENCOUNTER — Ambulatory Visit (INDEPENDENT_AMBULATORY_CARE_PROVIDER_SITE_OTHER): Payer: Commercial Managed Care - PPO | Admitting: Podiatry

## 2022-09-30 DIAGNOSIS — B351 Tinea unguium: Secondary | ICD-10-CM

## 2022-09-30 DIAGNOSIS — M79674 Pain in right toe(s): Secondary | ICD-10-CM

## 2022-09-30 DIAGNOSIS — M79675 Pain in left toe(s): Secondary | ICD-10-CM

## 2022-09-30 MED ORDER — TERBINAFINE HCL 250 MG PO TABS: 250.0000 mg | ORAL_TABLET | Freq: Every day | ORAL | 0 refills | Status: DC

## 2022-09-30 NOTE — Progress Notes (Signed)
   Chief Complaint  Patient presents with   Follow-up    Patient is here for follow-up for left foot great toe.    Subjective: 51 y.o. female presenting today for follow-up evaluation of nail dystrophy to the bilateral great toes concerning for possible fungal nail infection.  Past Medical History:  Diagnosis Date   Anxiety    Bipolar disorder ([place])    Depression     No past surgical history on file.  No Known Allergies  Objective: Physical Exam General: The patient is alert and oriented x3 in no acute distress.  Dermatology: Hyperkeratotic, discolored, thickened, onychodystrophy noted bilateral great toenails. Skin is warm, dry and supple bilateral lower extremities. Negative for open lesions or macerations.  Vascular: Palpable pedal pulses bilaterally. No edema or erythema noted. Capillary refill within normal limits.  Neurological: Epicritic and protective threshold grossly intact bilaterally.   Musculoskeletal Exam: No pedal deformity noted  Assessment: #1 Onychomycosis of toenails bilateral great toes  Plan of Care:  #1 Patient was evaluated.  Nail biopsy results were reviewed today. #2  Today we discussed different treatment options including oral, topical, and laser antifungal treatment modalities.  We discussed their efficacies and side effects.  Patient opts for oral antifungal treatment modality #3 prescription for Lamisil 250 mg #90 daily. Pt denies a history of liver pathology or symptoms.  Patient otherwise healthy #4  Topical antifungal tolcylen dispensed at checkout  #5 return to clinic 6 months   Edrick Kins, DPM [facility]  Dr. Edrick Kins, DPM    [address],  [address]                Office [phone]  Fax [phone]

## 2022-10-02 ENCOUNTER — Encounter: Payer: Self-pay | Admitting: Podiatry

## 2022-10-03 ENCOUNTER — Other Ambulatory Visit (HOSPITAL_COMMUNITY): Payer: Self-pay | Admitting: Psychiatry

## 2022-10-03 ENCOUNTER — Telehealth (HOSPITAL_BASED_OUTPATIENT_CLINIC_OR_DEPARTMENT_OTHER): Payer: Commercial Managed Care - PPO | Admitting: Psychiatry

## 2022-10-03 DIAGNOSIS — F3130 Bipolar disorder, current episode depressed, mild or moderate severity, unspecified: Secondary | ICD-10-CM

## 2022-10-03 DIAGNOSIS — F411 Generalized anxiety disorder: Secondary | ICD-10-CM | POA: Diagnosis not present

## 2022-10-03 DIAGNOSIS — G4700 Insomnia, unspecified: Secondary | ICD-10-CM

## 2022-10-03 DIAGNOSIS — F410 Panic disorder [episodic paroxysmal anxiety] without agoraphobia: Secondary | ICD-10-CM

## 2022-10-03 MED ORDER — BUSPIRONE HCL 5 MG PO TABS: 5.0000 mg | ORAL_TABLET | Freq: Two times a day (BID) | ORAL | 0 refills | Status: DC

## 2022-10-03 NOTE — Progress Notes (Signed)
Virtual Visit via Phone Note  I connected with Kelly Beasley on 10/03/22 at  2:30 PM EST by phone. We attempted to connect by a video enabled telemedicine application but were not able able to connect so we opted to continue by phone. I verified that I am speaking with the correct person using two identifiers.  Location: Patient: home Provider: office   I discussed the limitations of evaluation and management by telemedicine and the availability of in person appointments. The patient expressed understanding and agreed to proceed.  History of Present Illness: Kelly Beasley has been taking Buspar for about 3 weeks now. She shares her depression and anxiety have improved. She feels depressed and anxious about 2-3 days/week. Her mind is not racing as much either.  Her sleep has improved to 6 hrs/night. The last time she had any thoughts of death was 7 days ago. She denies SI/HI. Kelly Beasley denies any manic or hypomanic like symptoms or episodes. She denies any SE and wants to continue Buspar.    Observations/Objective: Psychiatric Specialty Exam:  General Appearance: unable to assess  Eye Contact:  unable to assess  Speech:  Clear and Coherent and Normal Rate  Volume:  Normal  Mood:  Euthymic  Affect:  Full Range  Thought Process:  Goal Directed, Linear, and Descriptions of Associations: Intact  Orientation:  Full (Time, Place, and Person)  Thought Content:  Logical  Suicidal Thoughts:  No  Homicidal Thoughts:  No  Memory:  Immediate;   Good  Judgement:  Good  Insight:  Good  Psychomotor Activity: unable to assess  Concentration:  Concentration: Good  Recall:  Good  Fund of Knowledge:  Good  Language:  Good  Akathisia:  unable to assess  Handed:  unable to assess  AIMS (if indicated):     Assets:  Communication Skills Desire for Improvement Financial Resources/Insurance Housing Leisure Time Resilience Social Support Talents/Skills Transportation Vocational/Educational  ADL's:  unable  to assess  Cognition:  WNL  Sleep:         Assessment and Plan:     09/12/2022   11:16 AM 05/30/2022    1:50 PM 05/09/2022    1:24 PM 03/14/2022    8:24 AM 02/28/2022   11:46 AM  Depression screen PHQ 2/9  Decreased Interest 1 1 2 1 3   Down, Depressed, Hopeless 1 1 2 1 2   PHQ - 2 Score 2 2 4 2 5   Altered sleeping 1 0 3 1 3   Tired, decreased energy 1 0 3 1 3   Change in appetite 1 1 2  0 3  Feeling bad or failure about yourself  1 1 2 1 2   Trouble concentrating 1 1 3  0 2  Moving slowly or fidgety/restless 0 0 0 0 0  Suicidal thoughts 1 0 1 0 0  PHQ-9 Score 8 5 18 5 18   Difficult doing work/chores Somewhat difficult Very difficult Extremely dIfficult Somewhat difficult Very difficult    Flowsheet Row Video Visit from 09/12/2022 [facility] from 08/13/2022 in [facility] from 07/31/2022 in [facility] RISK CATEGORY Error: Q3, 4, or 5 should not be populated when Q2 is No No Risk No Risk          Pt is aware that these meds carry a teratogenic risk. Pt will discuss plan of action if she does or plans to become pregnant in the future.  Status of current problems:  improvement in mood and depression   Meds: continue Zyprexa, Effexor XR and Trazodone.  1. Bipolar I disorder, most recent episode depressed (HCC) - busPIRone (BUSPAR) 5 MG tablet; Take 1 tablet (5 mg total) by mouth 2 (two) times daily.  Dispense: 180 tablet; Refill: 0  2. GAD (generalized anxiety disorder) - busPIRone (BUSPAR) 5 MG tablet; Take 1 tablet (5 mg total) by mouth 2 (two) times daily.  Dispense: 180 tablet; Refill: 0     Labs:     Therapy: brief supportive therapy provided    Collaboration of Care: Other none  Patient/Guardian was advised Release of Information must be obtained prior to any record release in order to collaborate their care with an outside provider. Patient/Guardian was advised  if they have not already done so to contact the registration department to sign all necessary forms in order for [place] to release information regarding their care.   Consent: Patient/Guardian gives verbal consent for treatment and assignment of benefits for services provided during this visit. Patient/Guardian expressed understanding and agreed to proceed.       Follow Up Instructions: Follow up in 2 months or sooner if needed    I discussed the assessment and treatment plan with the patient. The patient was provided an opportunity to ask questions and all were answered. The patient agreed with the plan and demonstrated an understanding of the instructions.   The patient was advised to call back or seek an in-person evaluation if the symptoms worsen or if the condition fails to improve as anticipated.  I provided 6 minutes of non-face-to-face time during this encounter.   Oletta Darter, MD

## 2022-10-26 ENCOUNTER — Observation Stay (HOSPITAL_COMMUNITY)
Admission: EM | Admit: 2022-10-26 | Discharge: 2022-10-27 | Disposition: A | Discharge status: Home / Self Care | Payer: Commercial Managed Care - PPO | Attending: Family Medicine | Admitting: Family Medicine

## 2022-10-26 ENCOUNTER — Emergency Department (HOSPITAL_COMMUNITY): Payer: Commercial Managed Care - PPO

## 2022-10-26 ENCOUNTER — Other Ambulatory Visit: Payer: Self-pay

## 2022-10-26 DIAGNOSIS — F3130 Bipolar disorder, current episode depressed, mild or moderate severity, unspecified: Secondary | ICD-10-CM | POA: Diagnosis present

## 2022-10-26 DIAGNOSIS — Z79899 Other long term (current) drug therapy: Secondary | ICD-10-CM | POA: Insufficient documentation

## 2022-10-26 DIAGNOSIS — F319 Bipolar disorder, unspecified: Secondary | ICD-10-CM | POA: Diagnosis not present

## 2022-10-26 DIAGNOSIS — G928 Other toxic encephalopathy: Secondary | ICD-10-CM | POA: Diagnosis present

## 2022-10-26 DIAGNOSIS — Z1152 Encounter for screening for COVID-19: Secondary | ICD-10-CM | POA: Insufficient documentation

## 2022-10-26 DIAGNOSIS — F419 Anxiety disorder, unspecified: Secondary | ICD-10-CM | POA: Diagnosis present

## 2022-10-26 DIAGNOSIS — E860 Dehydration: Secondary | ICD-10-CM | POA: Diagnosis present

## 2022-10-26 DIAGNOSIS — E1165 Type 2 diabetes mellitus with hyperglycemia: Secondary | ICD-10-CM | POA: Insufficient documentation

## 2022-10-26 DIAGNOSIS — R4182 Altered mental status, unspecified: Secondary | ICD-10-CM | POA: Diagnosis not present

## 2022-10-26 DIAGNOSIS — T43211A Poisoning by selective serotonin and norepinephrine reuptake inhibitors, accidental (unintentional), initial encounter: Secondary | ICD-10-CM | POA: Diagnosis not present

## 2022-10-26 DIAGNOSIS — G4700 Insomnia, unspecified: Secondary | ICD-10-CM | POA: Diagnosis present

## 2022-10-26 DIAGNOSIS — R739 Hyperglycemia, unspecified: Secondary | ICD-10-CM

## 2022-10-26 DIAGNOSIS — T43291A Poisoning by other antidepressants, accidental (unintentional), initial encounter: Secondary | ICD-10-CM | POA: Diagnosis not present

## 2022-10-26 DIAGNOSIS — R410 Disorientation, unspecified: Principal | ICD-10-CM | POA: Diagnosis present

## 2022-10-26 DIAGNOSIS — E669 Obesity, unspecified: Secondary | ICD-10-CM | POA: Diagnosis present

## 2022-10-26 NOTE — ED Triage Notes (Signed)
Pt BIB [facility] from home after neighbors found pt walking around shirtless outside. Per EMS pt took triple her dose of trazodone d/t inability to sleep. Pt is unaware of the strength of dose. EMS gave 250 NS. Pt AO4 but lethargic at times. VSS

## 2022-10-26 NOTE — ED Provider Notes (Signed)
[facility] EMERGENCY DEPARTMENT Provider Note   CSN: [id number] Arrival date & time: 10/26/22  2330     History  Chief Complaint  Patient presents with   Drug Overdose   Level 5 caveat due to altered mental status Kelly Beasley is a 51 y.o. female.  The history is provided by the patient.  Patient with history of bipolar presents via EMS for concerns about her behavior.  Per EMS, patient was found walking in her neighborhood without a shirt.  Patient reports taking triple dose of her trazodone as she was been unable to sleep.  EMS gave patient fluids.  Patient reports she was just asking her neighbor for a cigarette lighter.  She then reports that she recently delivered a baby.  Patient is an unreliable historian     Past Medical History:  Diagnosis Date   Anxiety    Bipolar disorder (HCC)    Depression     Home Medications Prior to Admission medications   Medication Sig Start Date End Date Taking? Authorizing Provider  busPIRone (BUSPAR) 5 MG tablet Take 1 tablet (5 mg total) by mouth 2 (two) times daily. 10/03/22   Oletta Darter, MD  fluticasone [facility]) 50 MCG/ACT nasal spray Place 2 sprays into both nostrils daily. 02/16/21   Rushie Chestnut, PA-C  OLANZapine (ZYPREXA) 15 MG tablet Take 2 tablets (30 mg total) by mouth at bedtime. 09/12/22   Oletta Darter, MD  terbinafine (LAMISIL) 250 MG tablet Take 1 tablet (250 mg total) by mouth daily. 09/30/22   Felecia Shelling, DPM  traZODone (DESYREL) 100 MG tablet Take 1 tablet (100 mg total) by mouth at bedtime as needed for sleep. 09/12/22   Oletta Darter, MD  venlafaxine XR (EFFEXOR XR) 75 MG 24 hr capsule Take 3 capsules (225 mg total) by mouth daily. 09/12/22 09/12/23  Oletta Darter, MD      Allergies    Patient has no known allergies.    Review of Systems   Review of Systems  Unable to perform ROS: Mental status change    Physical Exam Updated Vital Signs BP 116/71   Pulse 91   Temp  (!) 97.5 F (36.4 C) (Rectal)   Resp 19   SpO2 96%  Physical Exam CONSTITUTIONAL: Disheveled HEAD: Normocephalic/atraumatic EYES: EOMI/PERRL, no nystagmus ENMT: Mucous membranes moist NECK: supple no meningeal signs SPINE/BACK:entire spine nontender CV: S1/S2 noted, tachycardic LUNGS: Decreased breath sounds noted on the right ABDOMEN: soft, nontender GU:no cva tenderness NEURO: Pt is awake/alert.  No facial droop.  No arm or leg drift.  Patient is able to follow all commands and is calm. EXTREMITIES: pulses normal/equal, full ROM, no deformities SKIN: warm, color normal PSYCH: Mildly sedated, has tangential speech  ED Results / Procedures / Treatments   Labs (all labs ordered are listed, but only abnormal results are displayed) Labs Reviewed  COMPREHENSIVE METABOLIC PANEL - Abnormal; Notable for the following components:      Result Value   Sodium 133 (*)    Glucose, Bld 600 (*)    All other components within normal limits  SALICYLATE LEVEL - Abnormal; Notable for the following components:   Salicylate Lvl <7.0 (*)    All other components within normal limits  ACETAMINOPHEN LEVEL - Abnormal; Notable for the following components:   Acetaminophen (Tylenol), Serum <10 (*)    All other components within normal limits  URINALYSIS, ROUTINE W REFLEX MICROSCOPIC - Abnormal; Notable for the following components:   Color, Urine  COLORLESS (*)    Specific Gravity, Urine 1.033 (*)    Glucose, UA >=500 (*)    Ketones, ur 5 (*)    All other components within normal limits  CBG MONITORING, ED - Abnormal; Notable for the following components:   Glucose-Capillary 570 (*)    All other components within normal limits  RESP PANEL BY RT-PCR (FLU A&B, COVID) ARPGX2  ETHANOL  RAPID URINE DRUG SCREEN, HOSP PERFORMED  CBC WITH DIFFERENTIAL/PLATELET  CK  I-STAT BETA HCG BLOOD, ED ([facility], [facility], [facility] ONLY)    EKG EKG Interpretation  Date/Time:  Sunday October 27 2022 00:06:27 EST Ventricular  Rate:  113 PR Interval:  134 QRS Duration: 71 QT Interval:  325 QTC Calculation: 446 R Axis:   88 Text Interpretation: Sinus tachycardia Ventricular premature complex Aberrant complex Low voltage, precordial leads Borderline repolarization abnormality Confirmed by Zadie Rhine ([id number]) on 10/27/2022 12:10:19 AM  Radiology CT HEAD WO CONTRAST  Result Date: 10/27/2022 CLINICAL DATA:  Delirium EXAM: CT HEAD WITHOUT CONTRAST TECHNIQUE: Contiguous axial images were obtained from the base of the skull through the vertex without intravenous contrast. RADIATION DOSE REDUCTION: This exam was performed according to the departmental dose-optimization program which includes automated exposure control, adjustment of the mA and/or kV according to patient size and/or use of iterative reconstruction technique. COMPARISON:  None Available. FINDINGS: Brain: There is no mass, hemorrhage or extra-axial collection. The size and configuration of the ventricles and extra-axial CSF spaces are normal. The brain parenchyma is normal, without acute or chronic infarction. Vascular: No abnormal hyperdensity of the major intracranial arteries or dural venous sinuses. No intracranial atherosclerosis. Skull: The visualized skull base, calvarium and extracranial soft tissues are normal. Sinuses/Orbits: No fluid levels or advanced mucosal thickening of the visualized paranasal sinuses. No mastoid or middle ear effusion. The orbits are normal. IMPRESSION: Normal head CT. Electronically Signed   By: Deatra Robinson M.D.   On: 10/27/2022 01:09   DG Chest Port 1 View  Result Date: 10/27/2022 CLINICAL DATA:  Confusion EXAM: PORTABLE CHEST 1 VIEW COMPARISON:  01/25/2014 FINDINGS: Heart and mediastinal contours are within normal limits. No focal opacities or effusions. No acute bony abnormality. IMPRESSION: No active disease. Electronically Signed   By: Charlett Nose M.D.   On: 10/27/2022 00:05    Procedures .Critical Care  Performed by:  Zadie Rhine, MD Authorized by: Zadie Rhine, MD   Critical care provider statement:    Critical care time (minutes):  60   Critical care start time:  10/26/2022 11:30 PM   Critical care end time:  10/27/2022 12:30 AM   Critical care time was exclusive of:  Separately billable procedures and treating other patients   Critical care was necessary to treat or prevent imminent or life-threatening deterioration of the following conditions:  CNS failure or compromise, metabolic crisis and dehydration   Critical care was time spent personally by me on the following activities:  Examination of patient, development of treatment plan with patient or surrogate, pulse oximetry, ordering and review of radiographic studies, ordering and review of laboratory studies, re-evaluation of patient's condition, ordering and performing treatments and interventions and evaluation of patient's response to treatment   I assumed direction of critical care for this patient from another provider in my specialty: no     Care discussed with: admitting provider       Medications Ordered in ED Medications  sodium chloride 0.9 % bolus 2,000 mL (2,000 mLs Intravenous New Bag/Given 10/27/22 0016)  ED Course/ Medical Decision Making/ A&P Clinical Course as of 10/27/22 0236  Wynelle Link Oct 27, 2022  0005 Patient presents via EMS for concern for overdose.  Currently she was walking around her neighborhood without a shirt on.  She appears confused and has tangential speech.  Patient has known history of bipolar.  Patient reports she took triple the dose of her trazodone which is around 300 mg.  Extensive labs and imaging have been ordered [DW]  0008 Glucose-Capillary(!!): 570 Hyperglycemia [DW]  0234 Patient is now more calm.  It appears she has new onset diabetes.  Patient reports recent polyuria and polydipsia.  It is unclear the last time that she went to sleep.  I suspect patient is also having a manic episode.  Family at  bedside reports she has never had this kind of episode previously.  She will be admitted to the medical service for glucose control, and then have behavioral health evaluation afterwards [DW]    Clinical Course User Index [DW] Zadie Rhine, MD                           Medical Decision Making Amount and/or Complexity of Data Reviewed Labs: ordered. Decision-making details documented in ED Course. Radiology: ordered.  Risk Decision regarding hospitalization.   This patient presents to the ED for concern of overdose and altered mental status, this involves an extensive number of treatment options, and is a complaint that carries with it a high risk of complications and morbidity.  The differential diagnosis includes but is not limited to CVA, intracranial hemorrhage, renal failure, urinary tract infection, electrolyte disturbance, pneumonia, polysubstance overdose    Comorbidities that complicate the patient evaluation: Patient's presentation is complicated by their history of bipolar disorder  Social Determinants of Health: Social history unknown  Additional history obtained: Records reviewed  psychiatry notes reviewed  Lab Tests: I Ordered, and personally interpreted labs.  The pertinent results include: Hyperglycemia without anion gap  Imaging Studies ordered: I ordered imaging studies including CT scan head and X-ray chest   I independently visualized and interpreted imaging which showed no acute findings I agree with the radiologist interpretation  Cardiac Monitoring: The patient was maintained on a cardiac monitor.  I personally viewed and interpreted the cardiac monitor which showed an underlying rhythm of:  sinus tachycardia  Medicines ordered and prescription drug management: I ordered medication including IV fluids for dehydration Reevaluation of the patient after these medicines showed that the patient    improved   Critical Interventions:   IV fluids and  admission  Consultations Obtained: I requested consultation with the admitting physician Dr. Margo Aye , and discussed  findings as well as pertinent plan - they recommend: Admit, she will take care of any insulin  Reevaluation: After the interventions noted above, I reevaluated the patient and found that they have :improved  Complexity of problems addressed: Patient's presentation is most consistent with  acute presentation with potential threat to life or bodily function  Disposition: After consideration of the diagnostic results and the patient's response to treatment,  I feel that the patent would benefit from admission   .           Final Clinical Impression(s) / ED Diagnoses Final diagnoses:  Delirium  Hyperglycemia  Dehydration    Rx / DC Orders ED Discharge Orders     None         Zadie Rhine, MD 10/27/22 [phone]

## 2022-10-27 ENCOUNTER — Emergency Department (HOSPITAL_COMMUNITY): Payer: Commercial Managed Care - PPO

## 2022-10-27 DIAGNOSIS — T43211A Poisoning by selective serotonin and norepinephrine reuptake inhibitors, accidental (unintentional), initial encounter: Secondary | ICD-10-CM | POA: Diagnosis present

## 2022-10-27 DIAGNOSIS — G4700 Insomnia, unspecified: Secondary | ICD-10-CM | POA: Diagnosis not present

## 2022-10-27 DIAGNOSIS — G928 Other toxic encephalopathy: Secondary | ICD-10-CM | POA: Diagnosis present

## 2022-10-27 DIAGNOSIS — Z1152 Encounter for screening for COVID-19: Secondary | ICD-10-CM | POA: Diagnosis not present

## 2022-10-27 DIAGNOSIS — R410 Disorientation, unspecified: Principal | ICD-10-CM | POA: Diagnosis present

## 2022-10-27 DIAGNOSIS — E1165 Type 2 diabetes mellitus with hyperglycemia: Secondary | ICD-10-CM | POA: Diagnosis present

## 2022-10-27 DIAGNOSIS — F3130 Bipolar disorder, current episode depressed, mild or moderate severity, unspecified: Secondary | ICD-10-CM

## 2022-10-27 DIAGNOSIS — R4182 Altered mental status, unspecified: Secondary | ICD-10-CM | POA: Diagnosis present

## 2022-10-27 DIAGNOSIS — Z79899 Other long term (current) drug therapy: Secondary | ICD-10-CM | POA: Diagnosis not present

## 2022-10-27 DIAGNOSIS — E669 Obesity, unspecified: Secondary | ICD-10-CM | POA: Diagnosis present

## 2022-10-27 DIAGNOSIS — E860 Dehydration: Secondary | ICD-10-CM | POA: Diagnosis present

## 2022-10-27 DIAGNOSIS — F419 Anxiety disorder, unspecified: Secondary | ICD-10-CM | POA: Diagnosis present

## 2022-10-27 LAB — COMPREHENSIVE METABOLIC PANEL
ALT: 16 U/L (ref 0–44)
AST: 22 U/L (ref 15–41)
Albumin: 3.7 g/dL (ref 3.5–5.0)
Alkaline Phosphatase: 118 U/L (ref 38–126)
Anion gap: 12 (ref 5–15)
BUN: 13 mg/dL (ref 6–20)
CO2: 22 mmol/L (ref 22–32)
Calcium: 9.7 mg/dL (ref 8.9–10.3)
Chloride: 99 mmol/L (ref 98–111)
Creatinine, Ser: 0.75 mg/dL (ref 0.44–1.00)
GFR, Estimated: >60 mL/min (ref >60)
Glucose, Bld: 600 mg/dL (ref 70–99)
Potassium: 4.1 mmol/L (ref 3.5–5.1)
Sodium: 133 mmol/L — ABNORMAL LOW (ref 135–145)
Total Bilirubin: 1.1 mg/dL (ref 0.3–1.2)
Total Protein: 6.8 g/dL (ref 6.5–8.1)

## 2022-10-27 LAB — RESP PANEL BY RT-PCR (FLU A&B, COVID) ARPGX2
Influenza A by PCR: NEGATIVE
Influenza B by PCR: NEGATIVE
SARS Coronavirus 2 by RT PCR: NEGATIVE

## 2022-10-27 LAB — CBG MONITORING, ED
Glucose-Capillary: 293 mg/dL — ABNORMAL HIGH (ref 70–99)
Glucose-Capillary: 301 mg/dL — ABNORMAL HIGH (ref 70–99)
Glucose-Capillary: 369 mg/dL — ABNORMAL HIGH (ref 70–99)
Glucose-Capillary: 570 mg/dL (ref 70–99)

## 2022-10-27 LAB — SALICYLATE LEVEL: Salicylate Lvl: <7 mg/dL — ABNORMAL LOW (ref 7.0–30.0)

## 2022-10-27 LAB — CBC
HCT: 35.3 % — ABNORMAL LOW (ref 36.0–46.0)
Hemoglobin: 11.8 g/dL — ABNORMAL LOW (ref 12.0–15.0)
MCH: 29.6 pg (ref 26.0–34.0)
MCHC: 33.4 g/dL (ref 30.0–36.0)
MCV: 88.7 fL (ref 80.0–100.0)
Platelets: 427 10*3/uL — ABNORMAL HIGH (ref 150–400)
RBC: 3.98 MIL/uL (ref 3.87–5.11)
RDW: 12.2 % (ref 11.5–15.5)
WBC: 7.1 10*3/uL (ref 4.0–10.5)
nRBC: 0 % (ref 0.0–0.2)

## 2022-10-27 LAB — BASIC METABOLIC PANEL
Anion gap: 8 (ref 5–15)
BUN: 7 mg/dL (ref 6–20)
CO2: 24 mmol/L (ref 22–32)
Calcium: 8.5 mg/dL — ABNORMAL LOW (ref 8.9–10.3)
Chloride: 108 mmol/L (ref 98–111)
Creatinine, Ser: 0.62 mg/dL (ref 0.44–1.00)
GFR, Estimated: >60 mL/min (ref >60)
Glucose, Bld: 344 mg/dL — ABNORMAL HIGH (ref 70–99)
Potassium: 3.7 mmol/L (ref 3.5–5.1)
Sodium: 140 mmol/L (ref 135–145)

## 2022-10-27 LAB — URINALYSIS, ROUTINE W REFLEX MICROSCOPIC
Bacteria, UA: NONE SEEN
Bilirubin Urine: NEGATIVE
Glucose, UA: >=500 mg/dL — AB
Hgb urine dipstick: NEGATIVE
Ketones, ur: 5 mg/dL — AB
Leukocytes,Ua: NEGATIVE
Nitrite: NEGATIVE
Protein, ur: NEGATIVE mg/dL
Specific Gravity, Urine: 1.033 — ABNORMAL HIGH (ref 1.005–1.030)
pH: 5 (ref 5.0–8.0)

## 2022-10-27 LAB — CBC WITH DIFFERENTIAL/PLATELET
Abs Immature Granulocytes: 0.02 10*3/uL (ref 0.00–0.07)
Basophils Absolute: 0.1 10*3/uL (ref 0.0–0.1)
Basophils Relative: 1 %
Eosinophils Absolute: 0 10*3/uL (ref 0.0–0.5)
Eosinophils Relative: 0 %
HCT: 39 % (ref 36.0–46.0)
Hemoglobin: 13.8 g/dL (ref 12.0–15.0)
Immature Granulocytes: 0 %
Lymphocytes Relative: 20 %
Lymphs Abs: 1.6 10*3/uL (ref 0.7–4.0)
MCH: 30.5 pg (ref 26.0–34.0)
MCHC: 35.4 g/dL (ref 30.0–36.0)
MCV: 86.3 fL (ref 80.0–100.0)
Monocytes Absolute: 0.5 10*3/uL (ref 0.1–1.0)
Monocytes Relative: 7 %
Neutro Abs: 5.8 10*3/uL (ref 1.7–7.7)
Neutrophils Relative %: 72 %
Platelets: 379 10*3/uL (ref 150–400)
RBC: 4.52 MIL/uL (ref 3.87–5.11)
RDW: 12 % (ref 11.5–15.5)
WBC: 8.1 10*3/uL (ref 4.0–10.5)
nRBC: 0 % (ref 0.0–0.2)

## 2022-10-27 LAB — ACETAMINOPHEN LEVEL: Acetaminophen (Tylenol), Serum: <10 ug/mL — ABNORMAL LOW (ref 10–30)

## 2022-10-27 LAB — I-STAT BETA HCG BLOOD, ED (MC, WL, AP ONLY): I-stat hCG, quantitative: <5 m[IU]/mL (ref <5)

## 2022-10-27 LAB — RAPID URINE DRUG SCREEN, HOSP PERFORMED
Amphetamines: NOT DETECTED
Barbiturates: NOT DETECTED
Benzodiazepines: NOT DETECTED
Cocaine: NOT DETECTED
Opiates: NOT DETECTED
Tetrahydrocannabinol: NOT DETECTED

## 2022-10-27 LAB — MAGNESIUM: Magnesium: 1.8 mg/dL (ref 1.7–2.4)

## 2022-10-27 LAB — CK: Total CK: 72 U/L (ref 38–234)

## 2022-10-27 LAB — PHOSPHORUS: Phosphorus: 3.5 mg/dL (ref 2.5–4.6)

## 2022-10-27 LAB — ETHANOL: Alcohol, Ethyl (B): <10 mg/dL (ref <10)

## 2022-10-27 LAB — HIV ANTIBODY (ROUTINE TESTING W REFLEX): HIV Screen 4th Generation wRfx: NONREACTIVE

## 2022-10-27 MED ORDER — BLOOD GLUCOSE MONITOR KIT: PACK | 0 refills | Status: DC

## 2022-10-27 MED ORDER — PROCHLORPERAZINE EDISYLATE 10 MG/2ML IJ SOLN: 5.0000 mg | Freq: Four times a day (QID) | INTRAMUSCULAR | Status: DC | PRN

## 2022-10-27 MED ORDER — METFORMIN HCL 500 MG PO TABS
500.0000 mg | ORAL_TABLET | Freq: Every day | ORAL | Status: DC
  Administered 2022-10-27: 500 mg via ORAL
  Filled 2022-10-27: qty 1

## 2022-10-27 MED ORDER — SODIUM CHLORIDE 0.9 % IV BOLUS (SEPSIS)
2000.0000 mL | Freq: Once | INTRAVENOUS | Status: AC
  Administered 2022-10-27: 2000 mL via INTRAVENOUS

## 2022-10-27 MED ORDER — GLIPIZIDE 5 MG PO TABS: 5.0000 mg | ORAL_TABLET | Freq: Every day | ORAL | 0 refills | Status: DC

## 2022-10-27 MED ORDER — ENOXAPARIN SODIUM 40 MG/0.4ML IJ SOSY: 40.0000 mg | PREFILLED_SYRINGE | Freq: Every day | INTRAMUSCULAR | Status: DC

## 2022-10-27 MED ORDER — GLIPIZIDE 5 MG PO TABS
5.0000 mg | ORAL_TABLET | Freq: Every day | ORAL | Status: DC
  Administered 2022-10-27: 5 mg via ORAL
  Filled 2022-10-27 (×2): qty 1

## 2022-10-27 MED ORDER — MELATONIN 5 MG PO TABS: 5.0000 mg | ORAL_TABLET | Freq: Every evening | ORAL | Status: DC | PRN

## 2022-10-27 MED ORDER — ACETAMINOPHEN 325 MG PO TABS: 650.0000 mg | ORAL_TABLET | Freq: Four times a day (QID) | ORAL | Status: DC | PRN

## 2022-10-27 MED ORDER — SODIUM CHLORIDE 0.9 % IV SOLN: INTRAVENOUS | Status: DC

## 2022-10-27 MED ORDER — INSULIN ASPART 100 UNIT/ML IJ SOLN: 0.0000 [IU] | Freq: Every day | INTRAMUSCULAR | Status: DC

## 2022-10-27 MED ORDER — POLYETHYLENE GLYCOL 3350 17 G PO PACK: 17.0000 g | PACK | Freq: Every day | ORAL | Status: DC | PRN

## 2022-10-27 MED ORDER — INSULIN ASPART 100 UNIT/ML IJ SOLN
0.0000 [IU] | Freq: Three times a day (TID) | INTRAMUSCULAR | Status: DC
  Administered 2022-10-27: 15 [IU] via SUBCUTANEOUS
  Administered 2022-10-27: 8 [IU] via SUBCUTANEOUS

## 2022-10-27 MED ORDER — GLIPIZIDE 5 MG PO TABS: 5.0000 mg | ORAL_TABLET | Freq: Every day | ORAL | Status: DC

## 2022-10-27 MED ORDER — METFORMIN HCL 500 MG PO TABS: 500.0000 mg | ORAL_TABLET | Freq: Every day | ORAL | 0 refills | Status: DC

## 2022-10-27 NOTE — Inpatient Diabetes Management (Signed)
Inpatient Diabetes Program Recommendations  AACE/ADA: New Consensus Statement on Inpatient Glycemic Control (2015)  Target Ranges:  Prepandial:   less than 140 mg/dL      Peak postprandial:   less than 180 mg/dL (1-2 hours)      Critically ill patients:  140 - 180 mg/dL    Latest Reference Range & Units 10/26/22 23:47  Sodium 135 - 145 mmol/L 133 (L)  Potassium 3.5 - 5.1 mmol/L 4.1  Chloride 98 - 111 mmol/L 99  CO2 22 - 32 mmol/L 22  Glucose 70 - 99 mg/dL 462 (HH)  BUN 6 - 20 mg/dL 13  Creatinine 8.63 - 8.17 mg/dL 7.11  Calcium 8.9 - 65.7 mg/dL 9.7  Anion gap 5 - 15  12  (HH): Data is critically high (L): Data is abnormally low  Latest Reference Range & Units 10/27/22 00:06 10/27/22 10:30 10/27/22 13:14  Glucose-Capillary 70 - 99 mg/dL 903 (HH) 833 (H) 383 (H)  15 units Novolog @1433   Metformin 500 mg @1433   (HH): Data is critically high (H): Data is abnormally high   51 y.o. female with medical history significant for bipolar disorder, chronic anxiety/depression, sleep disorder, who presented to [facility] ED via EMS due to delirium.  The patient was found walking around shirtless in her neighborhood knocking on people's doors asking them to borrow a cigarette lighter.  Per EMS, the patient took triple dose of her trazodone last night due to insomnia.   En route, she received NS bolus 250 by EMS.  Notably hyperglycemic with CBG in the 400's and no prior history of diabetes.  She was brought into the ED for further evaluation.    Current Orders: Novolog Moderate Correction Scale/ SSI (0-15 units) TID AC + HS     Glipizide 5 mg daily     Metformin 500 mg daily    A1c level Pending  Got IVF in the ED (2L bolus) and started on Novolog at 2pm today  Oral DM meds Glpizide + Metformin also started  Of note, pt on several medications at home that could lead to Hyperglycemia  Will order educational booklet and RD consult for diet teaching.  Will see pt tomorrow 12/04 when she is  more appropriate    --Will follow patient during hospitalization--  [facility] RN, MSN, CDCES Diabetes Coordinator Inpatient Glycemic Control Team Team Pager: [phone] (8a-5p)

## 2022-10-27 NOTE — Evaluation (Signed)
Physical Therapy Evaluation and Discharge Patient Details Name: Kelly Beasley MRN: [id number] DOB: [birth year] Today's Date: 10/27/2022  History of Present Illness  Pt is a 51 y.o. F who presents 10/26/2022 with delirium and hyperglycemia. Significant PMH: bipolar disorder, chronic anxiety/depression, sleep disorder.  Clinical Impression  Patient evaluated by Physical Therapy with no further acute PT needs identified. Pt is A&O to self, time and situation, not place. Pt stating she is at [facility] and has difficulty with short term recall. Overall she is mobilizing well. Lethargic upon entry, but able to arouse with min stimulation. Pt ambulating hallway distances independently and able to perform dynamic balance tasks without loss of balance. 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 Recommend supervision initially for IADL's and no driving and reinforced with pt family present in room. All education has been completed and the patient has no further questions. No follow-up Physical Therapy or equipment needs. PT is signing off. Thank you for this referral.      Recommendations for follow up therapy are one component of a multi-disciplinary discharge planning process, led by the attending physician.  Recommendations may be updated based on patient status, additional functional criteria and insurance authorization.  Follow Up Recommendations No PT follow up      Assistance Recommended at Discharge Set up Supervision/Assistance  Patient can return home with the following  Direct supervision/assist for medications management;Direct supervision/assist for financial management;Assistance with cooking/housework    Equipment Recommendations None recommended by PT  Recommendations for Other Services       Functional Status Assessment Patient has had a recent decline in their functional status and demonstrates the ability to make  significant improvements in function in a reasonable and predictable amount of time.     Precautions / Restrictions Precautions Precautions: None Restrictions Weight Bearing Restrictions: No      Mobility  Bed Mobility Overal bed mobility: Independent                  Transfers Overall transfer level: Independent Equipment used: None                    Ambulation/Gait Ambulation/Gait assistance: Independent Gait Distance (Feet): 150 Feet Assistive device: None Gait Pattern/deviations: WFL(Within Functional Limits)       General Gait Details: Able to perform head turns, stepping over obstacles, pivot turns, varying gait speed without loss of balance  Stairs            Wheelchair Mobility    Modified Rankin (Stroke Patients Only)       Balance Overall balance assessment: No apparent balance deficits (not formally assessed)                                           Pertinent Vitals/Pain Pain Assessment Pain Assessment: No/denies pain    Home Living Family/patient expects to be discharged to:: Private residence Living Arrangements: Alone Available Help at Discharge: Family Type of Home: House Home Access: Level entry     Alternate Level Stairs-Number of Steps:  (flight) Home Layout: Two level Home Equipment: None Additional Comments: Plans to d/c to her mother's house initially in [place]    Prior Function Prior Level of Function : Independent/Modified Independent;Driving             Mobility Comments: 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 [employer]        Extremity/Trunk Assessment   Upper Extremity Assessment Upper Extremity Assessment: Overall WFL for tasks assessed    Lower Extremity Assessment Lower Extremity Assessment: Overall WFL for tasks assessed    Cervical / Trunk Assessment Cervical / Trunk Assessment: Normal  Communication   Communication: No difficulties  Cognition  Arousal/Alertness: Awake/alert Behavior During Therapy: WFL for tasks assessed/performed Overall Cognitive Status: Impaired/Different from baseline Area of Impairment: Orientation, Memory                 Orientation Level: Disoriented to, Place   Memory: Decreased short-term memory         General Comments: Pt not oriented to place of day of week, stating she is at [facility]. Difficulty with recall. Following all commands        General Comments      Exercises     Assessment/Plan    PT Assessment Patient does not need any further PT services  PT Problem List         PT Treatment Interventions      PT Goals (Current goals can be found in the Care Plan section)  Acute Rehab PT Goals Patient Stated Goal: pt family would like her cognition to return to baseline PT Goal Formulation: All assessment and education complete, DC therapy    Frequency       Co-evaluation               AM-PAC PT "6 Clicks" Mobility  Outcome Measure Help needed turning from your back to your side while in a flat bed without using bedrails?: None Help needed moving from lying on your back to sitting on the side of a flat bed without using bedrails?: None Help needed moving to and from a bed to a chair (including a wheelchair)?: None Help needed standing up from a chair using your arms (e.g., wheelchair or bedside chair)?: None Help needed to walk in hospital room?: None Help needed climbing 3-5 steps with a railing? : A Little 6 Click Score: 23    End of Session   Activity Tolerance: Patient tolerated treatment well Patient left: in bed;with call bell/phone within reach;with family/visitor present Nurse Communication: Mobility status PT Visit Diagnosis: Difficulty in walking, not elsewhere classified (R26.2)    Time: 9702-6378 PT Time Calculation (min) (ACUTE ONLY): 22 min   Charges:   PT Evaluation $PT Eval Low Complexity: 1 Low          Lillia Pauls, PT, DPT Acute  Rehabilitation Services Office [phone]   Kelly Beasley 10/27/2022, 12:52 PM

## 2022-10-27 NOTE — Discharge Summary (Signed)
Physician Discharge Summary   Patient: Kelly Beasley MRN: [id number] DOB: 07-17-1971  Admit date:     10/26/2022  Discharge date: 10/27/22  Discharge Physician: Edwin Dada   PCP: [facility]     Recommendations at discharge:  Follow up with PCP Dr. Ayesha Rumpf in 1 week for new diabetes Follow up with Psychiatry Dr. Doyne Keel within 1 month given diabetes and olanzapine     Discharge Diagnoses: Principal discharge problem   Unintentional trazodone overdose Active Problems:   Acute toxic encephalopathy due to trazodone and hypergylcemia   New onset type 2 diabetes with hyperglycemia without ketoacidosis   Bipolar I disorder, most recent episode depressed [facility])        Hospital Course: Kelly Beasley is a 51 y.o. F with Bipolar disorder, insomnia and obesity who presented with confusion.  Evidently found outside without a shirt on by neighbors.  Reported she had taken 3 tablets of trazodone.  Denied other ingestions or intent for self harm.  In the ER, patient had normal renal function, electrolytes, blood counts, and liver function.  Chest x-ray clear.  CT head unremarkable.  Glucose 600 without acidosis, new onset diabetes.  Her thought processes were disorganized and at times she was oriented x4, at other times she was disoriented.  Given fluids, insulin and improved overnight.       Acute toxic encephalopathy due to unintentional trazodone overdose and hyperglycemia The patient appeared to have a delirium which was provoked by trazodone, possibly also by hypoglycemia.  She had no hyperactivity, grandiosity, delusions to imply mania.  She was evaluated by psychiatry, who agreed she was not acutely manic nor imminent threat to self.  Her mentation improved in the ER, and she was released to the care of family.  Psychiatry follow up recommended.    New onset type 2 diabetes with hyperglycemia without ketoacidosis Noted here to have glucose >600  mg/dL without acidosis.    Treated with insulin and sugars came down to 200s.    Olanzapine is not a new medication but is a risk factor for diabetes.  Discharged on new metformin, glipizide and will plan for close PCP follow up and with glucometer.   Bipolar disorder See above, does not appear decompensated at this time.          The [facility] Controlled Substances Registry was reviewed for this patient prior to discharge.   Consultants: Psychiatry Procedures performed: CT head  Disposition: Home   DISCHARGE MEDICATION: Allergies as of 10/27/2022   No Known Allergies      Medication List     STOP taking these medications    amoxicillin-clavulanate 875-125 MG tablet Commonly known as: AUGMENTIN   promethazine-dextromethorphan 6.25-15 MG/5ML syrup Commonly known as: PROMETHAZINE-DM       TAKE these medications    blood glucose meter kit and supplies Kit Dispense based on patient and insurance preference. Use up to four times daily as directed.   busPIRone 5 MG tablet Commonly known as: BUSPAR Take 1 tablet (5 mg total) by mouth 2 (two) times daily.   fluticasone 50 MCG/ACT nasal spray Commonly known as: FLONASE Place 2 sprays into both nostrils daily.   glipiZIDE 5 MG tablet Commonly known as: GLUCOTROL Take 1 tablet (5 mg total) by mouth daily. Start taking on: October 28, 2022   metFORMIN 500 MG tablet Commonly known as: GLUCOPHAGE Take 1 tablet (500 mg total) by mouth daily. Start taking on: October 28, 2022   OLANZapine  15 MG tablet Commonly known as: ZYPREXA Take 2 tablets (30 mg total) by mouth at bedtime.   terbinafine 250 MG tablet Commonly known as: LamISIL Take 1 tablet (250 mg total) by mouth daily.   traZODone 100 MG tablet Commonly known as: DESYREL Take 1 tablet (100 mg total) by mouth at bedtime as needed for sleep.   venlafaxine XR 75 MG 24 hr capsule Commonly known as: Effexor XR Take 3 capsules (225 mg total) by  mouth daily.        Follow-up Information     Charlcie Cradle, MD. Call in 1 day(s).   Specialty: Psychiatry Why: Update provider on problems sleeping and ineffectiveness of Trazodone Contact information: [place] [address] [phone]                 Discharge Instructions     Discharge instructions   Complete by: As directed    **IMPORTANT DISCHARGE INSTRUCTIONS**   From Dr. Loleta Books: You were admitted for confusion and high blood sugar  We think this was a combination of your sugar being so high you were confused and also taking too much of your sleeping pills.  Here, you had a CT scan of the head that was normal.  Testing of your kidney and liver function was normal.  Your blood counts and electrolytes were normal.  The one exception is that we found your blood sugar was extremely high.  It appears you have diabetes  You should start the new medicines metformin and glipizide  IMPORTANT: Olanzapine in some cases makes diabetes worse. If you have just started this medicine, stop it and call your psychiatry doctor If you have been taking it a long time but have recently increased your dose, go back to the old dose and call your psychiatry doctor  If your dose is stable, it may be safe in the long term to continue this medicine, but either way, it is worth calling your psychiatry provider to let them know you were diagnosed with diabetes; they may want to make adjustments  Go see your primary care doctor in 7-10 days    For the new diabetes: Start taking metformin 500 mg once daily Eventually, your doctor may increase this to 2000 mg twice daily But for now, stick to 500 mg once daily A little diarrhea is normal at first but will get better  Also, take the glipizide 5 mg once daily before breakfast.   Check your blood sugar every morning. A "normal" blood sugar in the morning when you wake up is between 90 and 120  A "normal"  blood sugar after eating is 120 to 180  If your blood sugar is ever less than 70, you might have symptoms of shakes, fatigue, nausea, dizziness.  If this happens, drink some juice and check your sugar again in 15 minutes.  If the symptoms don't go away, go to the ER.  If your sugar is every greater than 400, call your primary care doctor   And remember, more important htan medicines for diabetes is carefully limiting sugar and other carbohydrates in the diet   Increase activity slowly   Complete by: As directed        Discharge Exam: There were no vitals filed for this visit.  General: Pt is alert, awake, not in acute distress Cardiovascular: RRR, nl S1-S2, no murmurs appreciated.   No LE edema.   Respiratory: Normal respiratory rate and rhythm.  CTAB without rales or  wheezes. Abdominal: Abdomen soft and non-tender.  No distension or HSM.   Neuro/Psych: Strength symmetric in upper and lower extremities.  Judgment and insight appear normal.   Condition at discharge: good  The results of significant diagnostics from this hospitalization (including imaging, microbiology, ancillary and laboratory) are listed below for reference.   Imaging Studies: CT HEAD WO CONTRAST  Result Date: 10/27/2022 CLINICAL DATA:  Delirium EXAM: CT HEAD WITHOUT CONTRAST TECHNIQUE: Contiguous axial images were obtained from the base of the skull through the vertex without intravenous contrast. RADIATION DOSE REDUCTION: This exam was performed according to the departmental dose-optimization program which includes automated exposure control, adjustment of the mA and/or kV according to patient size and/or use of iterative reconstruction technique. COMPARISON:  None Available. FINDINGS: Brain: There is no mass, hemorrhage or extra-axial collection. The size and configuration of the ventricles and extra-axial CSF spaces are normal. The brain parenchyma is normal, without acute or chronic infarction. Vascular: No  abnormal hyperdensity of the major intracranial arteries or dural venous sinuses. No intracranial atherosclerosis. Skull: The visualized skull base, calvarium and extracranial soft tissues are normal. Sinuses/Orbits: No fluid levels or advanced mucosal thickening of the visualized paranasal sinuses. No mastoid or middle ear effusion. The orbits are normal. IMPRESSION: Normal head CT. Electronically Signed   By: Ulyses Jarred M.D.   On: 10/27/2022 01:09   DG Chest Port 1 View  Result Date: 10/27/2022 CLINICAL DATA:  Confusion EXAM: PORTABLE CHEST 1 VIEW COMPARISON:  01/25/2014 FINDINGS: Heart and mediastinal contours are within normal limits. No focal opacities or effusions. No acute bony abnormality. IMPRESSION: No active disease. Electronically Signed   By: Rolm Baptise M.D.   On: 10/27/2022 00:05    Microbiology: Results for orders placed or performed during the hospital encounter of 10/26/22  Resp Panel by RT-PCR (Flu A&B, Covid) Anterior Nasal Swab     Status: None   Collection Time: 10/26/22 11:57 PM   Specimen: Anterior Nasal Swab  Result Value Ref Range Status   SARS Coronavirus 2 by RT PCR NEGATIVE NEGATIVE Final    Comment: (NOTE) SARS-CoV-2 target nucleic acids are NOT DETECTED.  The SARS-CoV-2 RNA is generally detectable in upper respiratory specimens during the acute phase of infection. The lowest concentration of SARS-CoV-2 viral copies this assay can detect is 138 copies/mL. A negative result does not preclude SARS-Cov-2 infection and should not be used as the sole basis for treatment or other patient management decisions. A negative result may occur with  improper specimen collection/handling, submission of specimen other than nasopharyngeal swab, presence of viral mutation(s) within the areas targeted by this assay, and inadequate number of viral copies(<138 copies/mL). A negative result must be combined with clinical observations, patient history, and  epidemiological information. The expected result is Negative.  Fact Sheet for Patients:  EntrepreneurPulse.com.au  Fact Sheet for Healthcare Providers:  IncredibleEmployment.be  This test is no t yet approved or cleared by the [place] FDA and  has been authorized for detection and/or diagnosis of SARS-CoV-2 by FDA under an Emergency Use Authorization (EUA). This EUA will remain  in effect (meaning this test can be used) for the duration of the COVID-19 declaration under Section 564(b)(1) of the Act, 21 U.S.C.section 360bbb-3(b)(1), unless the authorization is terminated  or revoked sooner.       Influenza A by PCR NEGATIVE NEGATIVE Final   Influenza B by PCR NEGATIVE NEGATIVE Final    Comment: (NOTE) The Xpert Xpress SARS-CoV-2/FLU/RSV plus assay is intended as  an aid in the diagnosis of influenza from Nasopharyngeal swab specimens and should not be used as a sole basis for treatment. Nasal washings and aspirates are unacceptable for Xpert Xpress SARS-CoV-2/FLU/RSV testing.  Fact Sheet for Patients: EntrepreneurPulse.com.au  Fact Sheet for Healthcare Providers: IncredibleEmployment.be  This test is not yet approved or cleared by the [place] FDA and has been authorized for detection and/or diagnosis of SARS-CoV-2 by FDA under an Emergency Use Authorization (EUA). This EUA will remain in effect (meaning this test can be used) for the duration of the COVID-19 declaration under Section 564(b)(1) of the Act, 21 U.S.C. section 360bbb-3(b)(1), unless the authorization is terminated or revoked.  Performed at [facility], [place] [address], [place], [place] [address]     Labs: CBC: Recent Labs  Lab 10/26/22 2347 10/27/22 0417  WBC 8.1 7.1  NEUTROABS 5.8  --   HGB 13.8 11.8*  HCT 39.0 35.3*  MCV 86.3 88.7  PLT 379 915*   Basic Metabolic Panel: Recent Labs  Lab 10/26/22 2347  10/27/22 0417  NA 133* 140  K 4.1 3.7  CL 99 108  CO2 22 24  GLUCOSE 600* 344*  BUN 13 7  CREATININE 0.75 0.62  CALCIUM 9.7 8.5*  MG  --  1.8  PHOS  --  3.5   Liver Function Tests: Recent Labs  Lab 10/26/22 2347  AST 22  ALT 16  ALKPHOS 118  BILITOT 1.1  PROT 6.8  ALBUMIN 3.7   CBG: Recent Labs  Lab 10/27/22 0006 10/27/22 1030 10/27/22 1314 10/27/22 1610  GLUCAP 570* 301* 369* 293*    Discharge time spent: approximately 35 minutes spent on discharge counseling, evaluation of patient on day of discharge, and coordination of discharge planning with nursing, social work, pharmacy and case management  Signed: Edwin Dada, MD [facility] 10/27/2022

## 2022-10-27 NOTE — ED Notes (Signed)
Patient verbalizes understanding of discharge instructions. Opportunity for questioning and answers were provided. Armband removed by staff, pt discharged from ED. Pt taken to ED waiting room via wheelchair with family.

## 2022-10-27 NOTE — ED Notes (Signed)
Pt ambulated to the bathroom.  

## 2022-10-27 NOTE — H&P (Signed)
History and Physical  Kelly Beasley SHF:[id number] DOB: 14-May-1971 DOA: 10/26/2022  Referring physician: Dr. Bebe Shaggy, ED. PCP: Care, [facility]  Outpatient Specialists: Psychiatry Patient coming from: Home via EMS.  Chief Complaint: Delirium  HPI: Kelly Beasley is a 51 y.o. female with medical history significant for bipolar disorder, chronic anxiety/depression, sleep disorder, who presented to [facility] ED via EMS due to delirium.  The patient was found walking around shirtless in her neighborhood knocking on people's doors asking them to borrow a cigarette lighter.  Per EMS, the patient took triple dose of her trazodone last night due to insomnia.  En route, she received NS bolus 250 by EMS.  Notably hyperglycemic with CBG in the 400's and no prior history of diabetes.  She was brought into the ED for further evaluation.  In the ED, the patient is hyper somnolent at the time of this visit.  Her sister and brother-in-law are at bedside.  Due to concern for possible manic episode and new onset diabetes, EDP requested admission.  The patient was admitted by [facility], hospitalist service.  ED Course: Tmax 97.5.  BP 160/71, pulse 94, respiration rate 21, O2 saturation 91% on room air.  Lab studies remarkable for serum sodium 133, glucose 600.  Review of Systems: Review of systems as noted in the HPI. All other systems reviewed and are negative.   Past Medical History:  Diagnosis Date   Anxiety    Bipolar disorder (HCC)    Depression    No past surgical history on file.  Social History:  reports that she has never smoked. She has never used smokeless tobacco. She reports that she does not drink alcohol and does not use drugs.   No Known Allergies  Family History  Problem Relation Age of Onset   Cancer Mother       Prior to Admission medications   Medication Sig Start Date End Date Taking? Authorizing Provider  busPIRone (BUSPAR) 5 MG tablet Take 1 tablet (5 mg total) by  mouth 2 (two) times daily. 10/03/22   Oletta Darter, MD  fluticasone [facility]) 50 MCG/ACT nasal spray Place 2 sprays into both nostrils daily. 02/16/21   Rushie Chestnut, PA-C  OLANZapine (ZYPREXA) 15 MG tablet Take 2 tablets (30 mg total) by mouth at bedtime. 09/12/22   Oletta Darter, MD  terbinafine (LAMISIL) 250 MG tablet Take 1 tablet (250 mg total) by mouth daily. 09/30/22   Felecia Shelling, DPM  traZODone (DESYREL) 100 MG tablet Take 1 tablet (100 mg total) by mouth at bedtime as needed for sleep. 09/12/22   Oletta Darter, MD  venlafaxine XR (EFFEXOR XR) 75 MG 24 hr capsule Take 3 capsules (225 mg total) by mouth daily. 09/12/22 09/12/23  Oletta Darter, MD    Physical Exam: BP 116/71   Pulse 91   Temp (!) 97.5 F (36.4 C) (Rectal)   Resp 19   SpO2 96%   General: 52 y.o. year-old female well developed well nourished in no acute distress.  Hyper somnolent. Cardiovascular: Regular rate and rhythm with no rubs or gallops.  No thyromegaly or JVD noted.  No lower extremity edema. 2/4 pulses in all 4 extremities. Respiratory: Clear to auscultation with no wheezes or rales. Good inspiratory effort. Abdomen: Soft nontender nondistended with normal bowel sounds x4 quadrants. Muskuloskeletal: No cyanosis, clubbing or edema noted bilaterally Neuro: CN II-XII intact, strength, sensation, reflexes Skin: No ulcerative lesions noted or rashes Psychiatry: Unable to assess mood and judgement due to  hypersomnolence.          Labs on Admission:  Basic Metabolic Panel: Recent Labs  Lab 10/26/22 2347  NA 133*  K 4.1  CL 99  CO2 22  GLUCOSE 600*  BUN 13  CREATININE 0.75  CALCIUM 9.7   Liver Function Tests: Recent Labs  Lab 10/26/22 2347  AST 22  ALT 16  ALKPHOS 118  BILITOT 1.1  PROT 6.8  ALBUMIN 3.7   No results for input(s): "LIPASE", "AMYLASE" in the last 168 hours. No results for input(s): "AMMONIA" in the last 168 hours. CBC: Recent Labs  Lab 10/26/22 2347  WBC  8.1  NEUTROABS 5.8  HGB 13.8  HCT 39.0  MCV 86.3  PLT 379   Cardiac Enzymes: Recent Labs  Lab 10/26/22 2347  CKTOTAL 72    BNP (last 3 results) No results for input(s): "BNP" in the last 8760 hours.  ProBNP (last 3 results) No results for input(s): "PROBNP" in the last 8760 hours.  CBG: Recent Labs  Lab 10/27/22 0006  GLUCAP 570*    Radiological Exams on Admission: CT HEAD WO CONTRAST  Result Date: 10/27/2022 CLINICAL DATA:  Delirium EXAM: CT HEAD WITHOUT CONTRAST TECHNIQUE: Contiguous axial images were obtained from the base of the skull through the vertex without intravenous contrast. RADIATION DOSE REDUCTION: This exam was performed according to the departmental dose-optimization program which includes automated exposure control, adjustment of the mA and/or kV according to patient size and/or use of iterative reconstruction technique. COMPARISON:  None Available. FINDINGS: Brain: There is no mass, hemorrhage or extra-axial collection. The size and configuration of the ventricles and extra-axial CSF spaces are normal. The brain parenchyma is normal, without acute or chronic infarction. Vascular: No abnormal hyperdensity of the major intracranial arteries or dural venous sinuses. No intracranial atherosclerosis. Skull: The visualized skull base, calvarium and extracranial soft tissues are normal. Sinuses/Orbits: No fluid levels or advanced mucosal thickening of the visualized paranasal sinuses. No mastoid or middle ear effusion. The orbits are normal. IMPRESSION: Normal head CT. Electronically Signed   By: Deatra Robinson M.D.   On: 10/27/2022 01:09   DG Chest Port 1 View  Result Date: 10/27/2022 CLINICAL DATA:  Confusion EXAM: PORTABLE CHEST 1 VIEW COMPARISON:  01/25/2014 FINDINGS: Heart and mediastinal contours are within normal limits. No focal opacities or effusions. No acute bony abnormality. IMPRESSION: No active disease. Electronically Signed   By: Charlett Nose M.D.   On:  10/27/2022 00:05    EKG: I independently viewed the EKG done and my findings are as followed: Sinus tachycardia rate of 113.  Nonspecific ST-T changes.  QTc 446.  Assessment/Plan Present on Admission:  Delirium  Principal Problem:   Delirium  Delirium with concern for manic episode in setting of bipolar disorder Inability to sleep- Took 3 times dose of home trazodone Reorient as needed Treat underlying conditions and acute illness Delirium precautions  Acute metabolic encephalopathy, multifactorial From drug overdose, dehydration, possible manic episode. UDS was negative. Reorient as needed Fall precautions  Possible new diabetes, suspect type II Presented with serum glucose of 600 Obtain hemoglobin A1c and follow results. Consult diabetes coordinator Insulin sliding scale. IV fluid hydration  Pseudohyponatremia Serum sodium 133, serum glucose 600  Bipolar disorder Psychiatry consulted to assist with the management.   DVT prophylaxis: Subcu Lovenox daily  Code Status: Full code  Family Communication: Updated her sister and brother-in-law at bedside  Disposition Plan: Admitted to telemetry medical unit  Consults called: Psychiatry  Admission status:  Inpatient status.   Status is: Inpatient The patient will require at least 2 midnights for further evaluation and treatment of present condition.   Darlin Drop MD [facility] Pager [phone]  If 7PM-7AM, please contact night-coverage [email or web address] Password [email or web address]  10/27/2022, 2:46 AM

## 2022-10-27 NOTE — Consult Note (Cosign Needed Addendum)
[facility] ED ASSESSMENT   Reason for Consult:  Psychiatric Evaluation   Referring Physician:  Myrene Buddy, MD Patient Identification: Kelly Beasley MRN:  [id number] ED Chief Complaint: Insomnia  Diagnosis:  Principal Problem:   Insomnia Active Problems:   Bipolar I disorder, most recent episode depressed ([place])   Delirium   ED Assessment Time Calculation: Start Time: 0200 Stop Time: 0230 Total Time in Minutes (Assessment Completion): 30   Subjective:   Kelly Beasley is a 52 y.o. female patient admitted with [facility], voluntarily, after being found walking outside at a neighbors house confused, without a shirt, after taking 2 extra doses of Trazodone.  HPI:   [name], 51 year old female, with a medical history significant of bipolar disorder, chronic insomnia, evaluated face to face, per TTS consult, for psychiatric evaluation.  Patient arrived at [facility] emergency department via EMS after she presented to her neighbor's house with altered mental status and without a shirt on clothing pants after being locked out of her home.  Patient reports she has had trouble with insomnia over the past week.  Patient saw her psychiatrist approximately 3 weeks ago and had reported improved sleep with trazodone.  Patient reports over the last several days her sleep had been poor averaging 4 to 5 hours per night if that.  Patient works two full-time jobs and admits to not obtaining adequate rest.  She reported taking a total of 3 trazodone but confirms that she had no intention of killing herself she was just attempting to obtain sleep.  She reports after taking the medication she recalled that she left something outside in her car and walked outside for getting her key in the door locked behind her.  She did not have a shirt on but went over to a neighbor's house who noticed that she was dazed and confused and subsequently called 911.  When patient arrived here at the ED she was dehydrated, delirious,  and was found to have a blood sugar greater than 600.  She was diagnosed with new onset diabetes.  Patient endorses a longstanding history of chronic intermittent suicidal ideations however has never had an attempted suicide or overdose.  Her family returns the bedside after speaking with the patient along and confirms that she has never attempted to take her life. Patient has close psychiatric follow-up and is adherent with medications along with her appointments.  Patient will follow-up with her psychiatrist to discuss other alternatives of medications to help facilitate more appropriate sleep.     Risk to Self or Others: Is the patient at risk to self? No Has the patient been a risk to self in the past 6 months? No Has the patient been a risk to self within the distant past? No Is the patient a risk to others? No Has the patient been a risk to others in the past 6 months? No Has the patient been a risk to others within the distant past? No  [place] Scale:  [place] ED from 10/26/2022 in [place] Video Visit from 09/12/2022 in [facility] ED from 08/13/2022 in [facility] No Risk Error: Q3, 4, or 5 should not be populated when Q2 is No No Risk       Past Medical History:  Past Medical History:  Diagnosis Date   Anxiety    Bipolar disorder ([place])    Depression    No  past surgical history on file. Family History:  Family History  Problem Relation Age of Onset   Cancer Mother     Social History:  Social History   Substance and Sexual Activity  Alcohol Use No     Social History   Substance and Sexual Activity  Drug Use No    Social History   Socioeconomic History   Marital status: Divorced    Spouse name: Not on file   Number of children: Not on file   Years of education: Not on file   Highest education level: Not on file  Occupational  History   Not on file  Tobacco Use   Smoking status: Never   Smokeless tobacco: Never  Vaping Use   Vaping Use: Never used  Substance and Sexual Activity   Alcohol use: No   Drug use: No   Sexual activity: Never    Birth control/protection: Surgical  Other Topics Concern   Not on file  Social History Narrative   Not on file   Social Determinants of Health   Financial Resource Strain: Not on file  Food Insecurity: Not on file  Transportation Needs: Not on file  Physical Activity: Not on file  Stress: Not on file  Social Connections: Not on file   Additional Social History:    Allergies:  No Known Allergies  Labs:  Results for orders placed or performed during the hospital encounter of 10/26/22 (from the past 48 hour(s))  Comprehensive metabolic panel     Status: Abnormal   Collection Time: 10/26/22 11:47 PM  Result Value Ref Range   Sodium 133 (L) 135 - 145 mmol/L   Potassium 4.1 3.5 - 5.1 mmol/L    Comment: HEMOLYSIS AT THIS LEVEL MAY AFFECT RESULT   Chloride 99 98 - 111 mmol/L   CO2 22 22 - 32 mmol/L   Glucose, Bld 600 (HH) 70 - 99 mg/dL    Comment: CRITICAL RESULT CALLED TO, READ BACK BY AND VERIFIED WITH A. DECHAMBEAU, RN, 0124 10/27/22, A. RAMSEY Glucose reference range applies only to samples taken after fasting for at least 8 hours.    BUN 13 6 - 20 mg/dL   Creatinine, Ser 0.75 0.44 - 1.00 mg/dL   Calcium 9.7 8.9 - 10.3 mg/dL   Total Protein 6.8 6.5 - 8.1 g/dL   Albumin 3.7 3.5 - 5.0 g/dL   AST 22 15 - 41 U/L    Comment: HEMOLYSIS AT THIS LEVEL MAY AFFECT RESULT   ALT 16 0 - 44 U/L    Comment: HEMOLYSIS AT THIS LEVEL MAY AFFECT RESULT   Alkaline Phosphatase 118 38 - 126 U/L   Total Bilirubin 1.1 0.3 - 1.2 mg/dL    Comment: HEMOLYSIS AT THIS LEVEL MAY AFFECT RESULT   GFR, Estimated >60 >60 mL/min    Comment: (NOTE) Calculated using the CKD-EPI Creatinine Equation (2021)    Anion gap 12 5 - 15    Comment: Performed at [facility], [place]  [address], [place], [place] [address]  Salicylate level     Status: Abnormal   Collection Time: 10/26/22 11:47 PM  Result Value Ref Range   Salicylate Lvl <5.0 (L) 7.0 - 30.0 mg/dL    Comment: Performed at [facility] [address], [place], [place] [address]  Acetaminophen level     Status: Abnormal   Collection Time: 10/26/22 11:47 PM  Result Value Ref Range   Acetaminophen (Tylenol), Serum <10 (L) 10 - 30 ug/mL  Comment: (NOTE) Therapeutic concentrations vary significantly. A range of 10-30 ug/mL  may be an effective concentration for many patients. However, some  are best treated at concentrations outside of this range. Acetaminophen concentrations >150 ug/mL at 4 hours after ingestion  and >50 ug/mL at 12 hours after ingestion are often associated with  toxic reactions.  Performed at [facility], [place] [address], [place], [place] [address]   Ethanol     Status: None   Collection Time: 10/26/22 11:47 PM  Result Value Ref Range   Alcohol, Ethyl (B) <10 <10 mg/dL    Comment: (NOTE) Lowest detectable limit for serum alcohol is 10 mg/dL.  For medical purposes only. Performed at [facility], [place] [address], [place], [place] [address]   Urine rapid drug screen (hosp performed)     Status: None   Collection Time: 10/26/22 11:47 PM  Result Value Ref Range   Opiates NONE DETECTED NONE DETECTED   Cocaine NONE DETECTED NONE DETECTED   Benzodiazepines NONE DETECTED NONE DETECTED   Amphetamines NONE DETECTED NONE DETECTED   Tetrahydrocannabinol NONE DETECTED NONE DETECTED   Barbiturates NONE DETECTED NONE DETECTED    Comment: (NOTE) DRUG SCREEN FOR MEDICAL PURPOSES ONLY.  IF CONFIRMATION IS NEEDED FOR ANY PURPOSE, NOTIFY LAB WITHIN 5 DAYS.  LOWEST DETECTABLE LIMITS FOR URINE DRUG SCREEN Drug Class                     Cutoff (ng/mL) Amphetamine and metabolites    1000 Barbiturate and metabolites    200 Benzodiazepine                 200 Opiates and  metabolites        300 Cocaine and metabolites        300 THC                            50 Performed at [facility], [place] [address], [place], [place] [address]   CBC WITH DIFFERENTIAL     Status: None   Collection Time: 10/26/22 11:47 PM  Result Value Ref Range   WBC 8.1 4.0 - 10.5 K/uL   RBC 4.52 3.87 - 5.11 MIL/uL   Hemoglobin 13.8 12.0 - 15.0 g/dL   HCT 39.0 36.0 - 46.0 %   MCV 86.3 80.0 - 100.0 fL   MCH 30.5 26.0 - 34.0 pg   MCHC 35.4 30.0 - 36.0 g/dL   RDW 12.0 11.5 - 15.5 %   Platelets 379 150 - 400 K/uL    Comment: REPEATED TO VERIFY   nRBC 0.0 0.0 - 0.2 %   Neutrophils Relative % 72 %   Neutro Abs 5.8 1.7 - 7.7 K/uL   Lymphocytes Relative 20 %   Lymphs Abs 1.6 0.7 - 4.0 K/uL   Monocytes Relative 7 %   Monocytes Absolute 0.5 0.1 - 1.0 K/uL   Eosinophils Relative 0 %   Eosinophils Absolute 0.0 0.0 - 0.5 K/uL   Basophils Relative 1 %   Basophils Absolute 0.1 0.0 - 0.1 K/uL   Immature Granulocytes 0 %   Abs Immature Granulocytes 0.02 0.00 - 0.07 K/uL    Comment: Performed at [facility] [address] [place], [place] [address]  CK     Status: None   Collection Time: 10/26/22 11:47 PM  Result Value Ref Range   Total CK 72 38 - 234  U/L    Comment: HEMOLYSIS AT THIS LEVEL MAY AFFECT RESULT Performed at [facility], [place] [address] [place], [place] [address]   Urinalysis, Routine w reflex microscopic Urine, Clean Catch     Status: Abnormal   Collection Time: 10/26/22 11:47 PM  Result Value Ref Range   Color, Urine COLORLESS (A) YELLOW   APPearance CLEAR CLEAR   Specific Gravity, Urine 1.033 (H) 1.005 - 1.030   pH 5.0 5.0 - 8.0   Glucose, UA >=500 (A) NEGATIVE mg/dL   Hgb urine dipstick NEGATIVE NEGATIVE   Bilirubin Urine NEGATIVE NEGATIVE   Ketones, ur 5 (A) NEGATIVE mg/dL   Protein, ur NEGATIVE NEGATIVE mg/dL   Nitrite NEGATIVE NEGATIVE   Leukocytes,Ua NEGATIVE NEGATIVE   RBC / HPF 0-5 0 - 5 RBC/hpf   WBC, UA 0-5 0 - 5 WBC/hpf    Bacteria, UA NONE SEEN NONE SEEN   Squamous Epithelial / LPF 0-5 0 - 5    Comment: Performed at [facility], [place] [address], [place], [place] [address]  Resp Panel by RT-PCR (Flu A&B, Covid) Anterior Nasal Swab     Status: None   Collection Time: 10/26/22 11:57 PM   Specimen: Anterior Nasal Swab  Result Value Ref Range   SARS Coronavirus 2 by RT PCR NEGATIVE NEGATIVE    Comment: (NOTE) SARS-CoV-2 target nucleic acids are NOT DETECTED.  The SARS-CoV-2 RNA is generally detectable in upper respiratory specimens during the acute phase of infection. The lowest concentration of SARS-CoV-2 viral copies this assay can detect is 138 copies/mL. A negative result does not preclude SARS-Cov-2 infection and should not be used as the sole basis for treatment or other patient management decisions. A negative result may occur with  improper specimen collection/handling, submission of specimen other than nasopharyngeal swab, presence of viral mutation(s) within the areas targeted by this assay, and inadequate number of viral copies(<138 copies/mL). A negative result must be combined with clinical observations, patient history, and epidemiological information. The expected result is Negative.  Fact Sheet for Patients:  EntrepreneurPulse.com.au  Fact Sheet for Healthcare Providers:  IncredibleEmployment.be  This test is no t yet approved or cleared by the [place] FDA and  has been authorized for detection and/or diagnosis of SARS-CoV-2 by FDA under an Emergency Use Authorization (EUA). This EUA will remain  in effect (meaning this test can be used) for the duration of the COVID-19 declaration under Section 564(b)(1) of the Act, 21 U.S.C.section 360bbb-3(b)(1), unless the authorization is terminated  or revoked sooner.       Influenza A by PCR NEGATIVE NEGATIVE   Influenza B by PCR NEGATIVE NEGATIVE    Comment: (NOTE) The Xpert Xpress  SARS-CoV-2/FLU/RSV plus assay is intended as an aid in the diagnosis of influenza from Nasopharyngeal swab specimens and should not be used as a sole basis for treatment. Nasal washings and aspirates are unacceptable for Xpert Xpress SARS-CoV-2/FLU/RSV testing.  Fact Sheet for Patients: EntrepreneurPulse.com.au  Fact Sheet for Healthcare Providers: IncredibleEmployment.be  This test is not yet approved or cleared by the [place] FDA and has been authorized for detection and/or diagnosis of SARS-CoV-2 by FDA under an Emergency Use Authorization (EUA). This EUA will remain in effect (meaning this test can be used) for the duration of the COVID-19 declaration under Section 564(b)(1) of the Act, 21 U.S.C. section 360bbb-3(b)(1), unless the authorization is terminated or revoked.  Performed at [facility], [place] [address], [place], [place] [address]  CBG monitoring, ED     Status: Abnormal   Collection Time: 10/27/22 12:06 AM  Result Value Ref Range   Glucose-Capillary 570 (HH) 70 - 99 mg/dL    Comment: Glucose reference range applies only to samples taken after fasting for at least 8 hours.   Comment 1 Notify RN   I-Stat beta hCG blood, ED     Status: None   Collection Time: 10/27/22 12:07 AM  Result Value Ref Range   I-stat hCG, quantitative <5.0 <5 mIU/mL   Comment 3            Comment:   GEST. AGE      CONC.  (mIU/mL)   <=1 WEEK        5 - 50     2 WEEKS       50 - 500     3 WEEKS       100 - 10,000     4 WEEKS     1,000 - 30,000        FEMALE AND NON-PREGNANT FEMALE:     LESS THAN 5 mIU/mL   HIV Antibody (routine testing w rflx)     Status: None   Collection Time: 10/27/22  4:17 AM  Result Value Ref Range   HIV Screen 4th Generation wRfx Non Reactive Non Reactive    Comment: Performed at [facility], [place] [address] [place], [place] [address]  CBC     Status: Abnormal   Collection Time: 10/27/22  4:17 AM  Result  Value Ref Range   WBC 7.1 4.0 - 10.5 K/uL   RBC 3.98 3.87 - 5.11 MIL/uL   Hemoglobin 11.8 (L) 12.0 - 15.0 g/dL   HCT 35.3 (L) 36.0 - 46.0 %   MCV 88.7 80.0 - 100.0 fL   MCH 29.6 26.0 - 34.0 pg   MCHC 33.4 30.0 - 36.0 g/dL   RDW 12.2 11.5 - 15.5 %   Platelets 427 (H) 150 - 400 K/uL   nRBC 0.0 0.0 - 0.2 %    Comment: Performed at [facility], [place] [address], [place], [place] [address]  Basic metabolic panel     Status: Abnormal   Collection Time: 10/27/22  4:17 AM  Result Value Ref Range   Sodium 140 135 - 145 mmol/L    Comment: DELTA CHECK NOTED   Potassium 3.7 3.5 - 5.1 mmol/L   Chloride 108 98 - 111 mmol/L   CO2 24 22 - 32 mmol/L   Glucose, Bld 344 (H) 70 - 99 mg/dL    Comment: Glucose reference range applies only to samples taken after fasting for at least 8 hours.   BUN 7 6 - 20 mg/dL   Creatinine, Ser 0.62 0.44 - 1.00 mg/dL   Calcium 8.5 (L) 8.9 - 10.3 mg/dL   GFR, Estimated >60 >60 mL/min    Comment: (NOTE) Calculated using the CKD-EPI Creatinine Equation (2021)    Anion gap 8 5 - 15    Comment: Performed at [facility] [address], [place], [place] [address]  Magnesium     Status: None   Collection Time: 10/27/22  4:17 AM  Result Value Ref Range   Magnesium 1.8 1.7 - 2.4 mg/dL    Comment: Performed at [facility] [address], [place], [place] [address]  Phosphorus     Status: None   Collection Time: 10/27/22  4:17 AM  Result Value Ref Range  Phosphorus 3.5 2.5 - 4.6 mg/dL    Comment: Performed at [facility], [place] [address], [place], [place] [address]  CBG monitoring, ED     Status: Abnormal   Collection Time: 10/27/22 10:30 AM  Result Value Ref Range   Glucose-Capillary 301 (H) 70 - 99 mg/dL    Comment: Glucose reference range applies only to samples taken after fasting for at least 8 hours.   Comment 1 Notify RN    Comment 2 Document in Chart   CBG monitoring, ED     Status: Abnormal   Collection Time: 10/27/22  1:14 PM   Result Value Ref Range   Glucose-Capillary 369 (H) 70 - 99 mg/dL    Comment: Glucose reference range applies only to samples taken after fasting for at least 8 hours.  CBG monitoring, ED     Status: Abnormal   Collection Time: 10/27/22  4:10 PM  Result Value Ref Range   Glucose-Capillary 293 (H) 70 - 99 mg/dL    Comment: Glucose reference range applies only to samples taken after fasting for at least 8 hours.    Current Facility-Administered Medications  Medication Dose Route Frequency Provider Last Rate Last Admin   acetaminophen (TYLENOL) tablet 650 mg  650 mg Oral Q6H PRN Hall, Carole N, DO       enoxaparin (LOVENOX) injection 40 mg  40 mg Subcutaneous Daily Hall, Carole N, DO       glipiZIDE (GLUCOTROL) tablet 5 mg  5 mg Oral Daily Danford, Suann Larry, MD   5 mg at 10/27/22 1620   insulin aspart (novoLOG) injection 0-15 Units  0-15 Units Subcutaneous TID WC Irene Pap N, DO   8 Units at 10/27/22 1620   insulin aspart (novoLOG) injection 0-5 Units  0-5 Units Subcutaneous QHS Hall, Carole N, DO       metFORMIN (GLUCOPHAGE) tablet 500 mg  500 mg Oral Daily Danford, Suann Larry, MD   500 mg at 10/27/22 1433   polyethylene glycol (MIRALAX / GLYCOLAX) packet 17 g  17 g Oral Daily PRN Kayleen Memos, DO       Current Outpatient Medications  Medication Sig Dispense Refill   blood glucose meter kit and supplies KIT Dispense based on patient and insurance preference. Use up to four times daily as directed. 1 each 0   busPIRone (BUSPAR) 5 MG tablet Take 1 tablet (5 mg total) by mouth 2 (two) times daily. 180 tablet 0   fluticasone (FLONASE) 50 MCG/ACT nasal spray Place 2 sprays into both nostrils daily. 16 mL 0   [START ON 10/28/2022] glipiZIDE (GLUCOTROL) 5 MG tablet Take 1 tablet (5 mg total) by mouth daily. 30 tablet 0   [START ON 10/28/2022] metFORMIN (GLUCOPHAGE) 500 MG tablet Take 1 tablet (500 mg total) by mouth daily. 30 tablet 0   OLANZapine (ZYPREXA) 15 MG tablet Take 2 tablets  (30 mg total) by mouth at bedtime. 180 tablet 0   terbinafine (LAMISIL) 250 MG tablet Take 1 tablet (250 mg total) by mouth daily. 90 tablet 0   traZODone (DESYREL) 100 MG tablet Take 1 tablet (100 mg total) by mouth at bedtime as needed for sleep. 90 tablet 0   venlafaxine XR (EFFEXOR XR) 75 MG 24 hr capsule Take 3 capsules (225 mg total) by mouth daily. 270 capsule 0   Psychiatric Specialty Exam: Presentation  General Appearance:  Appropriate for Environment  Eye Contact: Good  Speech: Clear and Coherent  Speech Volume: Normal  Handedness:  Right   Mood and Affect  Mood: Euthymic  Affect: Appropriate   Thought Process  Thought Processes: Coherent  Descriptions of Associations:Intact  Orientation:Full (Time, Place and Person)  Thought Content:Logical  History of Schizophrenia/Schizoaffective disorder:No data recorded Duration of Psychotic Symptoms:No data recorded Hallucinations:Hallucinations: None  Ideas of Reference:None  Suicidal Thoughts:Suicidal Thoughts: No (None at present, endorses intermittent SI (this is noted in recent psychatry note visit))  Homicidal Thoughts:Homicidal Thoughts: No   Sensorium  Memory: Immediate Good; Recent Good; Remote Good  Judgment: Good  Insight: Good   Executive Functions  Concentration: Good  Attention Span: Good  Recall: Good  Fund of Knowledge: Good  Language: Good   Psychomotor Activity  Psychomotor Activity: Psychomotor Activity: Normal   Assets  Assets: Communication Skills; Desire for Improvement; Social Support; Physical Health; Housing; Transportation; Vocational/Educational; Resilience    Sleep  Sleep: Sleep: Poor (experincing poor sleep increased trazodone from 1 to 3 pills to help with sleep)   Physical Exam: Physical Exam Constitutional:      Appearance: Normal appearance.  HENT:     Head: Normocephalic and atraumatic.  Eyes:     Extraocular Movements: Extraocular  movements intact.     Conjunctiva/sclera: Conjunctivae normal.     Pupils: Pupils are equal, round, and reactive to light.  Cardiovascular:     Rate and Rhythm: Normal rate and regular rhythm.  Pulmonary:     Effort: Pulmonary effort is normal.     Breath sounds: Normal breath sounds.  Musculoskeletal:     Cervical back: Normal range of motion.  Neurological:     General: No focal deficit present.     Mental Status: She is alert and oriented to person, place, and time.    Review of Systems  Psychiatric/Behavioral:  Negative for depression, hallucinations, memory loss, substance abuse and suicidal ideas. The patient has insomnia. The patient is not nervous/anxious.    Blood pressure 117/70, pulse 97, temperature 98.5 F (36.9 C), temperature source Oral, resp. rate 19, SpO2 98 %. There is no height or weight on file to calculate BMI.  Medical Decision Making: Patient case review and discussed with Dr. Dwyane Dee. Patient does not meet inpatient criteria for psychiatric treatment. Patient is able to contract for safety.  EDP, RN, notified of psychiatric clearance.   Disposition: No evidence of imminent risk to self or others at present.   Discussed crisis plan, support from social network, calling 911, coming to the Emergency Department, and calling Suicide Hotline. Follow-up with outpatient psychiatric provider within 1 week to discuss  medication management for insomnia.   Molli Barrows, FNP-C, PMHNP-BC 10/27/2022 5:46 PM

## 2022-10-28 LAB — HEMOGLOBIN A1C
Hgb A1c MFr Bld: >15.5 % — ABNORMAL HIGH (ref 4.8–5.6)
Mean Plasma Glucose: >398 mg/dL

## 2022-10-31 ENCOUNTER — Other Ambulatory Visit: Payer: Self-pay

## 2022-10-31 ENCOUNTER — Emergency Department (HOSPITAL_COMMUNITY)
Admission: EM | Admit: 2022-10-31 | Discharge: 2022-10-31 | Disposition: A | Discharge status: Home / Self Care | Payer: Commercial Managed Care - PPO | Attending: Emergency Medicine | Admitting: Emergency Medicine

## 2022-10-31 DIAGNOSIS — Z0279 Encounter for issue of other medical certificate: Secondary | ICD-10-CM | POA: Insufficient documentation

## 2022-10-31 DIAGNOSIS — Z0000 Encounter for general adult medical examination without abnormal findings: Secondary | ICD-10-CM

## 2022-10-31 NOTE — ED Triage Notes (Signed)
Pt reports she was seen on 12/2 for blood sugar issues and needs a note that she can return to work tomorrow.

## 2022-10-31 NOTE — ED Provider Notes (Signed)
[facility] EMERGENCY DEPARTMENT Provider Note   CSN: [id number] Arrival date & time: 10/31/22  1658     History  Chief Complaint  Patient presents with   Work note    Kelly Beasley is a 51 y.o. female.  Patient is presenting to the emergency department today with request for work note as she was seen a couple of days ago for hyperglycemia.  She was actually seen with concern for overdose and was incidentally found to be hyperglycemic.  She was started on metformin following this and diagnosed with new onset diabetes.  She does have a PCP that she is following with.  HPI     Home Medications Prior to Admission medications   Medication Sig Start Date End Date Taking? Authorizing Provider  blood glucose meter kit and supplies KIT Dispense based on patient and insurance preference. Use up to four times daily as directed. 10/27/22   Danford, Suann Larry, MD  busPIRone (BUSPAR) 5 MG tablet Take 1 tablet (5 mg total) by mouth 2 (two) times daily. 10/03/22   Charlcie Cradle, MD  fluticasone [facility]) 50 MCG/ACT nasal spray Place 2 sprays into both nostrils daily. 02/16/21   Hughie Closs, PA-C  glipiZIDE (GLUCOTROL) 5 MG tablet Take 1 tablet (5 mg total) by mouth daily. 10/28/22   Danford, Suann Larry, MD  metFORMIN (GLUCOPHAGE) 500 MG tablet Take 1 tablet (500 mg total) by mouth daily. 10/28/22   Danford, Suann Larry, MD  OLANZapine (ZYPREXA) 15 MG tablet Take 2 tablets (30 mg total) by mouth at bedtime. 09/12/22   Charlcie Cradle, MD  terbinafine (LAMISIL) 250 MG tablet Take 1 tablet (250 mg total) by mouth daily. 09/30/22   Edrick Kins, DPM  traZODone (DESYREL) 100 MG tablet Take 1 tablet (100 mg total) by mouth at bedtime as needed for sleep. 09/12/22   Charlcie Cradle, MD  venlafaxine XR (EFFEXOR XR) 75 MG 24 hr capsule Take 3 capsules (225 mg total) by mouth daily. 09/12/22 09/12/23  Charlcie Cradle, MD      Allergies    Patient has no known allergies.     Review of Systems   Review of Systems  All other systems reviewed and are negative.   Physical Exam Updated Vital Signs BP (!) 150/94 (BP Location: Right Arm)   Pulse (!) 113   Temp 98 F (36.7 C)   Resp 16   SpO2 96%  Physical Exam Vitals and nursing note reviewed.  Constitutional:      General: She is not in acute distress.    Appearance: Normal appearance. She is well-developed. She is not ill-appearing, toxic-appearing or diaphoretic.  HENT:     Head: Normocephalic and atraumatic.     Nose: No nasal deformity.     Mouth/Throat:     Lips: Pink. No lesions.  Eyes:     General: Gaze aligned appropriately. No scleral icterus.       Right eye: No discharge.        Left eye: No discharge.     Conjunctiva/sclera: Conjunctivae normal.     Right eye: Right conjunctiva is not injected. No exudate or hemorrhage.    Left eye: Left conjunctiva is not injected. No exudate or hemorrhage. Pulmonary:     Effort: Pulmonary effort is normal. No respiratory distress.  Skin:    General: Skin is warm and dry.  Neurological:     Mental Status: She is alert and oriented to person, place, and time.  Psychiatric:  Mood and Affect: Mood normal.        Speech: Speech normal.        Behavior: Behavior normal. Behavior is cooperative.     ED Results / Procedures / Treatments   Labs (all labs ordered are listed, but only abnormal results are displayed) Labs Reviewed - No data to display  EKG None  Radiology No results found.  Procedures Procedures   Medications Ordered in ED Medications - No data to display  ED Course/ Medical Decision Making/ A&P                           Medical Decision Making  Patient is requesting work note.  I have reviewed recent ED visit note.  Patient looks well here.  She has PCP follow-up.  She is cleared to return back to work.  Final Clinical Impression(s) / ED Diagnoses Final diagnoses:  Examination    Rx / DC Orders ED Discharge  Orders     None         Adolphus Birchwood, PA-C 10/31/22 1833    Tegeler, Gwenyth Allegra, MD 10/31/22 2044

## 2022-11-05 ENCOUNTER — Encounter (HOSPITAL_COMMUNITY): Payer: Self-pay | Admitting: *Deleted

## 2022-11-05 ENCOUNTER — Ambulatory Visit (HOSPITAL_COMMUNITY)
Admission: EM | Admit: 2022-11-05 | Discharge: 2022-11-05 | Disposition: A | Discharge status: Home / Self Care | Payer: Commercial Managed Care - PPO | Attending: Family Medicine | Admitting: Family Medicine

## 2022-11-05 DIAGNOSIS — E0865 Diabetes mellitus due to underlying condition with hyperglycemia: Secondary | ICD-10-CM | POA: Diagnosis not present

## 2022-11-05 DIAGNOSIS — E1165 Type 2 diabetes mellitus with hyperglycemia: Secondary | ICD-10-CM

## 2022-11-05 DIAGNOSIS — E119 Type 2 diabetes mellitus without complications: Secondary | ICD-10-CM

## 2022-11-05 LAB — COMPREHENSIVE METABOLIC PANEL
ALT: 16 U/L (ref 0–44)
AST: 14 U/L — ABNORMAL LOW (ref 15–41)
Albumin: 4.1 g/dL (ref 3.5–5.0)
Alkaline Phosphatase: 114 U/L (ref 38–126)
Anion gap: 12 (ref 5–15)
BUN: 7 mg/dL (ref 6–20)
CO2: 21 mmol/L — ABNORMAL LOW (ref 22–32)
Calcium: 9.3 mg/dL (ref 8.9–10.3)
Chloride: 101 mmol/L (ref 98–111)
Creatinine, Ser: 0.54 mg/dL (ref 0.44–1.00)
GFR, Estimated: >60 mL/min (ref >60)
Glucose, Bld: 308 mg/dL — ABNORMAL HIGH (ref 70–99)
Potassium: 3.7 mmol/L (ref 3.5–5.1)
Sodium: 134 mmol/L — ABNORMAL LOW (ref 135–145)
Total Bilirubin: 0.5 mg/dL (ref 0.3–1.2)
Total Protein: 7.5 g/dL (ref 6.5–8.1)

## 2022-11-05 LAB — CBC WITH DIFFERENTIAL/PLATELET
Abs Immature Granulocytes: 0.02 10*3/uL (ref 0.00–0.07)
Basophils Absolute: 0.1 10*3/uL (ref 0.0–0.1)
Basophils Relative: 1 %
Eosinophils Absolute: 0.1 10*3/uL (ref 0.0–0.5)
Eosinophils Relative: 1 %
HCT: 39.7 % (ref 36.0–46.0)
Hemoglobin: 14 g/dL (ref 12.0–15.0)
Immature Granulocytes: 0 %
Lymphocytes Relative: 37 %
Lymphs Abs: 2.9 10*3/uL (ref 0.7–4.0)
MCH: 29.8 pg (ref 26.0–34.0)
MCHC: 35.3 g/dL (ref 30.0–36.0)
MCV: 84.5 fL (ref 80.0–100.0)
Monocytes Absolute: 0.6 10*3/uL (ref 0.1–1.0)
Monocytes Relative: 8 %
Neutro Abs: 4.2 10*3/uL (ref 1.7–7.7)
Neutrophils Relative %: 53 %
Platelets: 416 10*3/uL — ABNORMAL HIGH (ref 150–400)
RBC: 4.7 MIL/uL (ref 3.87–5.11)
RDW: 12.2 % (ref 11.5–15.5)
WBC: 7.9 10*3/uL (ref 4.0–10.5)
nRBC: 0 % (ref 0.0–0.2)

## 2022-11-05 LAB — CBG MONITORING, ED: Glucose-Capillary: 332 mg/dL — ABNORMAL HIGH (ref 70–99)

## 2022-11-05 NOTE — ED Triage Notes (Signed)
She was seen in ED on 10/26/2022 for elevated blood sugar and started on metformin but did not start her glipizide yet.  She still feels like her BS is too high she checked it at work before coming to UC today and it was 483 that was a fasting blood sugar. She states she is having some nausea and feels very tired. She does have a PCP appt set up for next Monday, 11/11/2022.

## 2022-11-05 NOTE — Discharge Instructions (Signed)
You have had labs (blood work) sent today. We will call you with any significant abnormalities or if there is need to begin or change treatment or pursue further follow up.  You may also review your test results online through MyChart. If you do not have a MyChart account, instructions to sign up should be on your discharge paperwork.  Begin taking your metformin twice daily and your glipizide once daily until your follow up with your primary provider on Monday.

## 2022-11-06 NOTE — ED Provider Notes (Addendum)
[facility] CARE CENTER   [id number] 11/05/22 Arrival Time: 1343  ASSESSMENT & PLAN:  1. New onset type 2 diabetes mellitus (HCC)   2. Type 2 diabetes mellitus with hyperglycemia, without long-term current use of insulin (HCC)    Labs Reviewed  CBC WITH DIFFERENTIAL/PLATELET - Abnormal; Notable for the following components:      Result Value   Platelets 416 (*)    All other components within normal limits  COMPREHENSIVE METABOLIC PANEL - Abnormal; Notable for the following components:   Sodium 134 (*)    CO2 21 (*)    Glucose, Bld 308 (*)    AST 14 (*)    All other components within normal limits  CBG MONITORING, ED - Abnormal; Notable for the following components:   Glucose-Capillary 332 (*)    All other components within normal limits   May recheck CBC and CMP with PCP f/u which she has scheduled. No current intervention needed except for medication instructions as below.    Discharge Instructions      You have had labs (blood work) sent today. We will call you with any significant abnormalities or if there is need to begin or change treatment or pursue further follow up.  You may also review your test results online through MyChart. If you do not have a MyChart account, instructions to sign up should be on your discharge paperwork.  Begin taking your metformin twice daily and your glipizide once daily until your follow up with your primary provider on Monday.      Discharge Medication List as of 11/05/2022  6:06 PM       Follow-up Information     [facility] EMERGENCY DEPARTMENT.   Specialty: Emergency Medicine Why: If symptoms worsen in any way or if your blood sugars are not coming down withint the next 48 hours. Contact information: [address] [id number] mc [place] [address] [phone]                Reviewed expectations re: course of current medical issues. Questions answered. Outlined signs and  symptoms indicating need for more acute intervention. Understanding verbalized. After Visit Summary given.   SUBJECTIVE: History from: Patient. Kelly Beasley is a 51 y.o. female. Reports: She was seen in ED on 10/26/2022 for elevated blood sugar and started on metformin but did not start her glipizide yet.  She still feels like her BS is too high she checked it at work before coming to UC today and it was 483 that was a fasting blood sugar. She states she is having some nausea and feels very tired. She does have a PCP appt set up for next Monday, 11/11/2022. Normal PO intake without emesis. No recent illnesses.  OBJECTIVE:  Vitals:   11/05/22 1717  BP: (!) 145/95  Pulse: (!) 104  Resp: 18  Temp: 98 F (36.7 C)  TempSrc: Oral  SpO2: 96%    General appearance: alert; no distress Eyes: PERRLA; EOMI; conjunctiva normal HENT: [place]; AT; without nasal congestion Neck: supple  Lungs: speaks full sentences without difficulty; unlabored Abd: benign Extremities: no edema Skin: warm and dry Neurologic: normal gait Psychological: alert and cooperative; normal mood and affect  Labs: Results for orders placed or performed during the hospital encounter of 11/05/22  CBC with Differential/Platelet  Result Value Ref Range   WBC 7.9 4.0 - 10.5 K/uL   RBC 4.70 3.87 - 5.11 MIL/uL   Hemoglobin 14.0 12.0 - 15.0 g/dL  HCT 39.7 36.0 - 46.0 %   MCV 84.5 80.0 - 100.0 fL   MCH 29.8 26.0 - 34.0 pg   MCHC 35.3 30.0 - 36.0 g/dL   RDW 36.4 68.0 - 32.1 %   Platelets 416 (H) 150 - 400 K/uL   nRBC 0.0 0.0 - 0.2 %   Neutrophils Relative % 53 %   Neutro Abs 4.2 1.7 - 7.7 K/uL   Lymphocytes Relative 37 %   Lymphs Abs 2.9 0.7 - 4.0 K/uL   Monocytes Relative 8 %   Monocytes Absolute 0.6 0.1 - 1.0 K/uL   Eosinophils Relative 1 %   Eosinophils Absolute 0.1 0.0 - 0.5 K/uL   Basophils Relative 1 %   Basophils Absolute 0.1 0.0 - 0.1 K/uL   Immature Granulocytes 0 %   Abs Immature Granulocytes 0.02 0.00 -  0.07 K/uL  Comprehensive metabolic panel  Result Value Ref Range   Sodium 134 (L) 135 - 145 mmol/L   Potassium 3.7 3.5 - 5.1 mmol/L   Chloride 101 98 - 111 mmol/L   CO2 21 (L) 22 - 32 mmol/L   Glucose, Bld 308 (H) 70 - 99 mg/dL   BUN 7 6 - 20 mg/dL   Creatinine, Ser 2.24 0.44 - 1.00 mg/dL   Calcium 9.3 8.9 - 82.5 mg/dL   Total Protein 7.5 6.5 - 8.1 g/dL   Albumin 4.1 3.5 - 5.0 g/dL   AST 14 (L) 15 - 41 U/L   ALT 16 0 - 44 U/L   Alkaline Phosphatase 114 38 - 126 U/L   Total Bilirubin 0.5 0.3 - 1.2 mg/dL   GFR, Estimated >00 >37 mL/min   Anion gap 12 5 - 15  POC CBG monitoring  Result Value Ref Range   Glucose-Capillary 332 (H) 70 - 99 mg/dL   Labs Reviewed  CBC WITH DIFFERENTIAL/PLATELET - Abnormal; Notable for the following components:      Result Value   Platelets 416 (*)    All other components within normal limits  COMPREHENSIVE METABOLIC PANEL - Abnormal; Notable for the following components:   Sodium 134 (*)    CO2 21 (*)    Glucose, Bld 308 (*)    AST 14 (*)    All other components within normal limits  CBG MONITORING, ED - Abnormal; Notable for the following components:   Glucose-Capillary 332 (*)    All other components within normal limits   No Known Allergies  Past Medical History:  Diagnosis Date   Anxiety    Bipolar disorder (HCC)    Depression    Social History   Socioeconomic History   Marital status: Divorced    Spouse name: Not on file   Number of children: Not on file   Years of education: Not on file   Highest education level: Not on file  Occupational History   Not on file  Tobacco Use   Smoking status: Never   Smokeless tobacco: Never  Vaping Use   Vaping Use: Never used  Substance and Sexual Activity   Alcohol use: No   Drug use: No   Sexual activity: Never    Birth control/protection: Surgical  Other Topics Concern   Not on file  Social History Narrative   Not on file   Social Determinants of Health   Financial Resource  Strain: Not on file  Food Insecurity: Not on file  Transportation Needs: Not on file  Physical Activity: Not on file  Stress: Not on file  Social Connections: Not on file  Intimate Partner Violence: Not on file   Family History  Problem Relation Age of Onset   Cancer Father    History reviewed. No pertinent surgical history.   Mardella Layman, MD 11/06/22 1037    Mardella Layman, MD 11/19/22 [phone]

## 2022-11-28 ENCOUNTER — Telehealth (HOSPITAL_COMMUNITY): Payer: Commercial Managed Care - PPO | Admitting: Psychiatry

## 2022-12-12 ENCOUNTER — Telehealth (HOSPITAL_COMMUNITY): Payer: Self-pay | Admitting: Psychiatry

## 2022-12-12 ENCOUNTER — Telehealth (HOSPITAL_COMMUNITY): Payer: Commercial Managed Care - PPO | Admitting: Psychiatry

## 2022-12-12 NOTE — Telephone Encounter (Signed)
Patient was not present on video platform used through [employer]. I called the patient and asked her to get online. I sent her an email link to the video platform. After 30 minutes still did not come online. IShe was no show for their scheduled appointment.

## 2022-12-17 ENCOUNTER — Other Ambulatory Visit (HOSPITAL_COMMUNITY): Payer: Self-pay | Admitting: *Deleted

## 2022-12-17 ENCOUNTER — Other Ambulatory Visit (HOSPITAL_COMMUNITY): Payer: Self-pay | Admitting: Psychiatry

## 2022-12-17 DIAGNOSIS — G4700 Insomnia, unspecified: Secondary | ICD-10-CM

## 2022-12-17 MED ORDER — TRAZODONE HCL 100 MG PO TABS: 100.0000 mg | ORAL_TABLET | Freq: Every evening | ORAL | 0 refills | Status: DC | PRN

## 2022-12-18 ENCOUNTER — Other Ambulatory Visit: Payer: Self-pay | Admitting: Podiatry

## 2022-12-25 ENCOUNTER — Other Ambulatory Visit: Payer: Self-pay | Admitting: Podiatry

## 2022-12-26 ENCOUNTER — Other Ambulatory Visit (HOSPITAL_COMMUNITY): Payer: Self-pay | Admitting: Psychiatry

## 2022-12-26 ENCOUNTER — Telehealth (HOSPITAL_COMMUNITY): Payer: Commercial Managed Care - PPO | Admitting: Psychiatry

## 2022-12-26 DIAGNOSIS — G4700 Insomnia, unspecified: Secondary | ICD-10-CM

## 2022-12-26 MED ORDER — TRAZODONE HCL 100 MG PO TABS: 150.0000 mg | ORAL_TABLET | Freq: Every evening | ORAL | 0 refills | Status: DC | PRN

## 2022-12-26 NOTE — Progress Notes (Signed)
Pt was unable to connect to the mychart video. She was informed that she may incur an out of pocket expense if she opted to continue by phone. Kelly Beasley decided to reschedule. She is going to contact mychart IT so we can do video visits in the future. She asked that Trazodone be increased due to poor sleep.   I sent in script for Trazodone 150mg  qHS. We rescheduled for 01/16/23 at 2:30pm.

## 2023-01-16 ENCOUNTER — Telehealth (HOSPITAL_BASED_OUTPATIENT_CLINIC_OR_DEPARTMENT_OTHER): Payer: Commercial Managed Care - PPO | Admitting: Psychiatry

## 2023-01-16 ENCOUNTER — Encounter (HOSPITAL_COMMUNITY): Payer: Self-pay | Admitting: Psychiatry

## 2023-01-16 DIAGNOSIS — F3130 Bipolar disorder, current episode depressed, mild or moderate severity, unspecified: Secondary | ICD-10-CM | POA: Diagnosis not present

## 2023-01-16 DIAGNOSIS — G4700 Insomnia, unspecified: Secondary | ICD-10-CM | POA: Diagnosis not present

## 2023-01-16 DIAGNOSIS — F411 Generalized anxiety disorder: Secondary | ICD-10-CM | POA: Diagnosis not present

## 2023-01-16 DIAGNOSIS — F410 Panic disorder [episodic paroxysmal anxiety] without agoraphobia: Secondary | ICD-10-CM

## 2023-01-16 MED ORDER — OLANZAPINE 15 MG PO TABS: 30.0000 mg | ORAL_TABLET | Freq: Every day | ORAL | 0 refills | Status: DC

## 2023-01-16 MED ORDER — VENLAFAXINE HCL ER 75 MG PO CP24: 225.0000 mg | ORAL_CAPSULE | Freq: Every day | ORAL | 0 refills | Status: DC

## 2023-01-16 MED ORDER — BUSPIRONE HCL 10 MG PO TABS: 10.0000 mg | ORAL_TABLET | Freq: Two times a day (BID) | ORAL | 0 refills | Status: DC

## 2023-01-16 MED ORDER — HYDROXYZINE PAMOATE 25 MG PO CAPS: ORAL_CAPSULE | ORAL | 0 refills | Status: DC

## 2023-01-16 NOTE — Progress Notes (Signed)
Virtual Visit via Video Note  I connected with Kelly Beasley on 01/16/23 at  2:30 PM EST by  a video enabled telemedicine application and verified that I am speaking with the correct person using two identifiers.  Location: Patient: at work Provider: office   I discussed the limitations of evaluation and management by telemedicine and the availability of in person appointments. The patient expressed understanding and agreed to proceed.  History of Present Illness: Kelly Beasley shares she is not sleeping well. She has trouble falling and staying asleep. Most nights she gets about 3 hrs. She no longer feels the Trazodone or Buspar are effective. Her anxiety is slowly getting better. It is worse when she is depressed. It is a day to day struggle. She has had 2 random panic attacks this week. She is depressed 3-4 days/week. She often has the desire to isolate and will do it sometimes. Her motivation is on the low side. Her appetite has mildly decreased. She has random passive thoughts of death and on/off worthlessness. She feels useless and something thinks her kids would be better off without her. She denies SI/HI. She denies manic and hypomanic like symptoms or episodes since our last visit.    Observations/Objective: Psychiatric Specialty Exam: ROS  There were no vitals taken for this visit.There is no height or weight on file to calculate BMI.  General Appearance: Fairly Groomed  Eye Contact:  Good  Speech:  Clear and Coherent and Normal Rate  Volume:  Normal  Mood:  Anxious and Depressed  Affect:  Congruent  Thought Process:  Goal Directed, Linear, and Descriptions of Associations: Intact  Orientation:  Full (Time, Place, and Person)  Thought Content:  Logical  Suicidal Thoughts:  No  Homicidal Thoughts:  No  Memory:  Immediate;   Good  Judgement:  Good  Insight:  Good  Psychomotor Activity:  Normal  Concentration:  Concentration: Good  Recall:  Good  Fund of Knowledge:  Good   Language:  Good  Akathisia:  No  Handed:  Right  AIMS (if indicated):     Assets:  Communication Skills Desire for Improvement Financial Resources/Insurance [place] Talents/Skills Transportation Vocational/Educational  ADL's:  Intact  Cognition:  WNL  Sleep:        Assessment and Plan:     01/16/2023    2:36 PM 09/12/2022   11:16 AM 05/30/2022    1:50 PM 05/09/2022    1:24 PM 03/14/2022    8:24 AM  Depression screen PHQ 2/9  Decreased Interest 1 1 1 2 1  $ Down, Depressed, Hopeless 1 1 1 2 1  $ PHQ - 2 Score 2 2 2 4 2  $ Altered sleeping 3 1 0 3 1  Tired, decreased energy 3 1 0 3 1  Change in appetite 1 1 1 2 $ 0  Feeling bad or failure about yourself  1 1 1 2 1  $ Trouble concentrating 1 1 1 3 $ 0  Moving slowly or fidgety/restless 0 0 0 0 0  Suicidal thoughts 1 1 0 1 0  PHQ-9 Score 12 8 5 18 5  $ Difficult doing work/chores Very difficult Somewhat difficult Very difficult Extremely dIfficult Somewhat difficult    Flowsheet Row Video Visit from 01/16/2023 in [facility] ED from 11/05/2022 in [facility] ED from 10/31/2022 in [facility] Emergency Department at [place] Error: Q3, 4, or 5 should not be populated when Q2 is No  No Risk No Risk          Pt is aware that these meds carry a teratogenic risk. Pt will discuss plan of action if she does or plans to become pregnant in the future.  Status of current problems: depression and anxiety are high, having poor sleep   Medication management with supportive therapy. Risks and benefits, side effects and alternative treatment options discussed with patient. Pt was given an opportunity to ask questions about medication, illness, and treatment. All current psychiatric medications have been reviewed and discussed with the patient and adjusted as clinically appropriate.  Pt verbalized understanding and verbal consent  obtained for treatment.  Meds: d/c Trazdone Start trial of Vistaril prn insomnia 1. Insomnia, unspecified type - hydrOXYzine (VISTARIL) 25 MG capsule; Take 1-2 tabs po qHS prn insomnia  Dispense: 180 capsule; Refill: 0  2. Bipolar I disorder, most recent episode depressed (HCC) - venlafaxine XR (EFFEXOR XR) 75 MG 24 hr capsule; Take 3 capsules (225 mg total) by mouth daily.  Dispense: 270 capsule; Refill: 0 - OLANZapine (ZYPREXA) 15 MG tablet; Take 2 tablets (30 mg total) by mouth at bedtime.  Dispense: 180 tablet; Refill: 0 - busPIRone (BUSPAR) 10 MG tablet; Take 1 tablet (10 mg total) by mouth 2 (two) times daily.  Dispense: 180 tablet; Refill: 0  3. GAD (generalized anxiety disorder) - venlafaxine XR (EFFEXOR XR) 75 MG 24 hr capsule; Take 3 capsules (225 mg total) by mouth daily.  Dispense: 270 capsule; Refill: 0 - busPIRone (BUSPAR) 10 MG tablet; Take 1 tablet (10 mg total) by mouth 2 (two) times daily.  Dispense: 180 tablet; Refill: 0  4. Panic disorder - venlafaxine XR (EFFEXOR XR) 75 MG 24 hr capsule; Take 3 capsules (225 mg total) by mouth daily.  Dispense: 270 capsule; Refill: 0 - hydrOXYzine (VISTARIL) 25 MG capsule; Take 1-2 tabs po qHS prn insomnia  Dispense: 180 capsule; Refill: 0     Labs: 11/05/22: Na 134, glucose 308, platelets 416 She see's her PCP tomorrow  EKG Qtc 446 on 10/28/22  Therapy: brief supportive therapy provided. Discussed psychosocial stressors in detail.     Collaboration of Care: Other n/a  Patient/Guardian was advised Release of Information must be obtained prior to any record release in order to collaborate their care with an outside provider. Patient/Guardian was advised if they have not already done so to contact the registration department to sign all necessary forms in order for [place] to release information regarding their care.   Consent: Patient/Guardian gives verbal consent for treatment and assignment of benefits for services provided during  this visit. Patient/Guardian expressed understanding and agreed to proceed.      Pt's acute risk factors for suicide are ongoing depression and anxiety symptoms and  low SES.  Pt's protective factors are denying SI and denying hx of suicide attempts, denies substance abuse, positive social support, sense of responsibility to her family, living with family. Pt denies SI and is at an acute low risk for suicide. Patient told to call clinic if any problems occur. Patient advised to go to ER if they should develop SI/HI, side effects, or if symptoms worsen. Pt has crisis numbers to call if needed. Pt acknowledged and agreed with plan and verbalized understanding.  Follow Up Instructions: Follow up in 1- 2 months or sooner if needed    I discussed the assessment and treatment plan with the patient. The patient was provided an opportunity to ask questions and all were answered. The  patient agreed with the plan and demonstrated an understanding of the instructions.   The patient was advised to call back or seek an in-person evaluation if the symptoms worsen or if the condition fails to improve as anticipated.  I provided 14 minutes of non-face-to-face time during this encounter.   Charlcie Cradle, MD

## 2023-01-20 ENCOUNTER — Other Ambulatory Visit: Payer: Self-pay | Admitting: Podiatry

## 2023-02-22 ENCOUNTER — Other Ambulatory Visit (HOSPITAL_COMMUNITY): Payer: Self-pay | Admitting: Psychiatry

## 2023-02-22 DIAGNOSIS — F3130 Bipolar disorder, current episode depressed, mild or moderate severity, unspecified: Secondary | ICD-10-CM

## 2023-03-03 NOTE — Telephone Encounter (Signed)
No coverage found per CoverMyMeds

## 2023-03-04 ENCOUNTER — Other Ambulatory Visit (HOSPITAL_COMMUNITY): Payer: Self-pay | Admitting: Psychiatry

## 2023-03-04 DIAGNOSIS — F3130 Bipolar disorder, current episode depressed, mild or moderate severity, unspecified: Secondary | ICD-10-CM

## 2023-03-04 DIAGNOSIS — F411 Generalized anxiety disorder: Secondary | ICD-10-CM

## 2023-03-13 ENCOUNTER — Telehealth (HOSPITAL_BASED_OUTPATIENT_CLINIC_OR_DEPARTMENT_OTHER): Payer: Commercial Managed Care - PPO | Admitting: Psychiatry

## 2023-03-13 ENCOUNTER — Other Ambulatory Visit (HOSPITAL_COMMUNITY): Payer: Self-pay | Admitting: Psychiatry

## 2023-03-13 DIAGNOSIS — G4700 Insomnia, unspecified: Secondary | ICD-10-CM

## 2023-03-13 DIAGNOSIS — F411 Generalized anxiety disorder: Secondary | ICD-10-CM

## 2023-03-13 DIAGNOSIS — G2589 Other specified extrapyramidal and movement disorders: Secondary | ICD-10-CM | POA: Diagnosis not present

## 2023-03-13 DIAGNOSIS — F3130 Bipolar disorder, current episode depressed, mild or moderate severity, unspecified: Secondary | ICD-10-CM | POA: Diagnosis not present

## 2023-03-13 DIAGNOSIS — F410 Panic disorder [episodic paroxysmal anxiety] without agoraphobia: Secondary | ICD-10-CM

## 2023-03-13 MED ORDER — HYDROXYZINE PAMOATE 25 MG PO CAPS: ORAL_CAPSULE | ORAL | 0 refills | Status: DC

## 2023-03-13 MED ORDER — BUSPIRONE HCL 15 MG PO TABS: 15.0000 mg | ORAL_TABLET | Freq: Two times a day (BID) | ORAL | 0 refills | Status: DC

## 2023-03-13 MED ORDER — OLANZAPINE 15 MG PO TABS: 30.0000 mg | ORAL_TABLET | Freq: Every day | ORAL | 0 refills | Status: DC

## 2023-03-13 MED ORDER — BENZTROPINE MESYLATE 0.5 MG PO TABS: 0.5000 mg | ORAL_TABLET | Freq: Two times a day (BID) | ORAL | 0 refills | Status: DC

## 2023-03-13 MED ORDER — VENLAFAXINE HCL ER 75 MG PO CP24: 225.0000 mg | ORAL_CAPSULE | Freq: Every day | ORAL | 0 refills | Status: DC

## 2023-03-13 NOTE — Progress Notes (Signed)
Virtual Visit via Video Note  I connected with Kelly Beasley on 03/13/23 at  2:30 PM EDT by a video enabled telemedicine application and verified that I am speaking with the correct person using two identifiers.  Location: Patient: at work Provider: office   I discussed the limitations of evaluation and management by telemedicine and the availability of in person appointments. The patient expressed understanding and agreed to proceed.  History of Present Illness: Kelly Beasley shares that her sleep has improved. She is now sleeping for 6-7 hrs/night. She wakes up feeling rested. Her energy is good thru the day. The Vistaril is effective and she denies SE. Her depression is improving slowly. She is depressed about 2-3 days/week. On those days she is unmotivated and wants to isolate but forces herself to get up and go work. She can't get herself together and can't focus. Kelly Beasley has on/off passive thoughts of death on bad days. It stays with her. She denies SI/HI. Kelly Beasley has improved self image. Her anxiety is slowly improving. It is less often and less intense. She is usually able to calm herself down by distracting. She has stress induced panic attacks 3-4x/week. Kelly Beasley has been grinding her teeth and her mouth moves like she is chewing on something. It is occurring daily.    Observations/Objective: Psychiatric Specialty Exam: ROS  There were no vitals taken for this visit.There is no height or weight on file to calculate BMI.  General Appearance: Casual and Fairly Groomed  Eye Contact:  Good  Speech:  Clear and Coherent and Normal Rate  Volume:  Normal  Mood:  Anxious and Depressed  Affect:  Blunt  Thought Process:  Goal Directed, Linear, and Descriptions of Associations: Intact  Orientation:  Full (Time, Place, and Person)  Thought Content:  Logical  Suicidal Thoughts:  No  Homicidal Thoughts:  No  Memory:  Immediate;   Good  Judgement:  Good  Insight:  Good  Psychomotor Activity:  Normal   Concentration:  Concentration: Good  Recall:  Good  Fund of Knowledge:  Good  Language:  Good  Akathisia:  No  Handed:  Right  AIMS (if indicated):     Assets:  Communication Skills Desire for Improvement Financial Resources/Insurance Housing Resilience Social Support Talents/Skills Transportation Vocational/Educational  ADL's:  Intact  Cognition:  WNL  Sleep:        Assessment and Plan:     03/13/2023    2:41 PM 01/16/2023    2:36 PM 09/12/2022   11:16 AM 05/30/2022    1:50 PM 05/09/2022    1:24 PM  Depression screen PHQ 2/9  Decreased Interest Down, Depressed, Hopeless PHQ - 2 Score Altered sleeping 0 3 1 0 3  Tired, decreased energy 0 3 1 0 3  Change in appetite 0 Feeling bad or failure about yourself  Trouble concentrating Moving slowly or fidgety/restless 0 0 0 0 0  Suicidal thoughts 0 1  PHQ-9 Score Difficult doing work/chores Very difficult Very difficult Somewhat difficult Very difficult Extremely dIfficult    Flowsheet Row [facility]13[facility] Video Visit from 01/16/2023 in [facility] from 11/05/2022 in [facility] RISK CATEGORY  Error: Q3, 4, or 5 should not be populated when Q2 is No Error: Q3, 4, or 5 should not be populated when Q2 is No No Risk          Pt is aware that these meds carry a teratogenic risk. Pt will discuss plan of action if she does or plans to become pregnant in the future.  Status of current problems: anxiety and depression are slowly improving and she is sleeping better. She is described some EPS symptoms   Medication management with supportive therapy. Risks and benefits, side effects and alternative treatment options discussed with patient. Pt was given an opportunity to ask questions about medication, illness,  and treatment. All current psychiatric medications have been reviewed and discussed with the patient and adjusted as clinically appropriate.  Pt verbalized understanding and verbal consent obtained for treatment.  Meds: increase Buspar 15mg  BID for depression and anxiety Start trial of Cogentin 0.5mg  BID for EPS 1. Bipolar I disorder, most recent episode depressed - OLANZapine (ZYPREXA) 15 MG tablet; Take 2 tablets (30 mg total) by mouth at bedtime.  Dispense: 180 tablet; Refill: 0 - venlafaxine XR (EFFEXOR XR) 75 MG 24 hr capsule; Take 3 capsules (225 mg total) by mouth daily.  Dispense: 270 capsule; Refill: 0 - busPIRone (BUSPAR) 15 MG tablet; Take 1 tablet (15 mg total) by mouth 2 (two) times daily.  Dispense: 180 tablet; Refill: 0  2. Combined pyramidal-extrapyramidal syndrome - benztropine (COGENTIN) 0.5 MG tablet; Take 1 tablet (0.5 mg total) by mouth 2 (two) times daily.  Dispense: 180 tablet; Refill: 0  3. GAD (generalized anxiety disorder) - venlafaxine XR (EFFEXOR XR) 75 MG 24 hr capsule; Take 3 capsules (225 mg total) by mouth daily.  Dispense: 270 capsule; Refill: 0 - busPIRone (BUSPAR) 15 MG tablet; Take 1 tablet (15 mg total) by mouth 2 (two) times daily.  Dispense: 180 tablet; Refill: 0  4. Panic disorder - hydrOXYzine (VISTARIL) 25 MG capsule; Take 1-2 tabs po qHS prn insomnia  Dispense: 180 capsule; Refill: 0 - venlafaxine XR (EFFEXOR XR) 75 MG 24 hr capsule; Take 3 capsules (225 mg total) by mouth daily.  Dispense: 270 capsule; Refill: 0  5. Insomnia, unspecified type - hydrOXYzine (VISTARIL) 25 MG capsule; Take 1-2 tabs po qHS prn insomnia  Dispense: 180 capsule; Refill: 0 - busPIRone (BUSPAR) 15 MG tablet; Take 1 tablet (15 mg total) by mouth 2 (two) times daily.  Dispense: 180 tablet; Refill: 0     Labs: none today    Therapy: brief supportive therapy provided. Discussed psychosocial stressors in detail.      Collaboration of Care: Other none  today  Patient/Guardian was advised Release of Information must be obtained prior to any record release in order to collaborate their care with an outside provider. Patient/Guardian was advised if they have not already done so to contact the registration department to sign all necessary forms in order for [place] to release information regarding their care.   Consent: Patient/Guardian gives verbal consent for treatment and assignment of benefits for services provided during this visit. Patient/Guardian expressed understanding and agreed to proceed.   I reviewed the information below on 03/13/2023 and have updated it. I agree with the information below.  Pt's acute risk factors for suicide are passive thoughts of death, ongoing depression symptoms.  Pt's protective factors are sense of responsibility to family, living with family, positive social support, denying SI with plan or intent and denies hx of suicide attempts. Pt denies  SI and is at an acute low risk for suicide. Patient told to call clinic if any problems occur. Patient advised to go to ER if they should develop SI/HI, side effects, or if symptoms worsen. Pt has crisis numbers to call if needed. Pt acknowledged and agreed with plan and verbalized understanding.   Follow Up Instructions: Follow up in 1 month or sooner if needed    I discussed the assessment and treatment plan with the patient. The patient was provided an opportunity to ask questions and all were answered. The patient agreed with the plan and demonstrated an understanding of the instructions.   The patient was advised to call back or seek an in-person evaluation if the symptoms worsen or if the condition fails to improve as anticipated.  I provided 12 minutes of non-face-to-face time during this encounter.   Oletta Darter, MD

## 2023-03-20 ENCOUNTER — Telehealth (HOSPITAL_COMMUNITY): Payer: Self-pay | Admitting: *Deleted

## 2023-03-20 NOTE — Telephone Encounter (Signed)
Writer left another VM for pt regarding current insurance information. Pt strongly encouraged to call office back with current card information. We cannot proceed with PA without this updated information.

## 2023-03-27 ENCOUNTER — Other Ambulatory Visit (HOSPITAL_COMMUNITY): Payer: Self-pay | Admitting: *Deleted

## 2023-03-27 DIAGNOSIS — F3130 Bipolar disorder, current episode depressed, mild or moderate severity, unspecified: Secondary | ICD-10-CM

## 2023-03-27 MED ORDER — OLANZAPINE 15 MG PO TABS: 30.0000 mg | ORAL_TABLET | Freq: Every day | ORAL | 0 refills | Status: DC

## 2023-03-31 ENCOUNTER — Encounter (HOSPITAL_COMMUNITY): Payer: Self-pay

## 2023-03-31 ENCOUNTER — Ambulatory Visit: Payer: Commercial Managed Care - PPO | Admitting: Podiatry

## 2023-04-10 ENCOUNTER — Telehealth (HOSPITAL_COMMUNITY): Payer: Commercial Managed Care - PPO | Admitting: Psychiatry

## 2023-04-17 ENCOUNTER — Telehealth (HOSPITAL_COMMUNITY): Payer: Commercial Managed Care - PPO | Admitting: Psychiatry

## 2023-04-17 ENCOUNTER — Telehealth (HOSPITAL_COMMUNITY): Payer: Self-pay | Admitting: Psychiatry

## 2023-04-17 NOTE — Telephone Encounter (Signed)
Patient was not present on video platform used through mychart. I called the patient at our scheduled appointment time. There was no answer. I left a voice message for patient to call the clinic back at their convinence. There was no return phone call during out scheduled visit time. I was not able to speak with the patient today, as they were a no show for their scheduled appointment.   

## 2023-05-01 ENCOUNTER — Telehealth (HOSPITAL_COMMUNITY): Payer: Commercial Managed Care - PPO | Admitting: Psychiatry

## 2023-05-01 ENCOUNTER — Telehealth (HOSPITAL_COMMUNITY): Payer: Self-pay | Admitting: Psychiatry

## 2023-05-01 NOTE — Telephone Encounter (Signed)
Patient was not present on video platform used through mychart. I called the patient at our scheduled appointment time. There was no answer. I left a voice message for patient to call the clinic back at their convinence. There was no return phone call during out scheduled visit time. I was not able to speak with the patient today, as they were a no show for their scheduled appointment.   

## 2023-06-09 ENCOUNTER — Ambulatory Visit (HOSPITAL_BASED_OUTPATIENT_CLINIC_OR_DEPARTMENT_OTHER): Payer: Commercial Managed Care - PPO | Admitting: Student

## 2023-06-09 ENCOUNTER — Encounter (HOSPITAL_COMMUNITY): Payer: Self-pay | Admitting: Student

## 2023-06-09 ENCOUNTER — Other Ambulatory Visit (HOSPITAL_COMMUNITY): Payer: Self-pay | Admitting: Psychiatry

## 2023-06-09 DIAGNOSIS — F410 Panic disorder [episodic paroxysmal anxiety] without agoraphobia: Secondary | ICD-10-CM | POA: Diagnosis not present

## 2023-06-09 DIAGNOSIS — G2401 Drug induced subacute dyskinesia: Secondary | ICD-10-CM | POA: Diagnosis not present

## 2023-06-09 DIAGNOSIS — F250 Schizoaffective disorder, bipolar type: Secondary | ICD-10-CM

## 2023-06-09 DIAGNOSIS — F411 Generalized anxiety disorder: Secondary | ICD-10-CM

## 2023-06-09 DIAGNOSIS — T43505A Adverse effect of unspecified antipsychotics and neuroleptics, initial encounter: Secondary | ICD-10-CM

## 2023-06-09 MED ORDER — HYDROXYZINE PAMOATE 25 MG PO CAPS: ORAL_CAPSULE | ORAL | 0 refills | Status: DC

## 2023-06-09 MED ORDER — VALBENAZINE TOSYLATE 40 MG PO CAPS: 40.0000 mg | ORAL_CAPSULE | Freq: Every day | ORAL | 1 refills | Status: DC

## 2023-06-09 MED ORDER — VENLAFAXINE HCL ER 75 MG PO CP24: 225.0000 mg | ORAL_CAPSULE | Freq: Every day | ORAL | 1 refills | Status: DC

## 2023-06-09 MED ORDER — BUSPIRONE HCL 7.5 MG PO TABS: 7.5000 mg | ORAL_TABLET | Freq: Two times a day (BID) | ORAL | 1 refills | Status: AC

## 2023-06-09 MED ORDER — OLANZAPINE 20 MG PO TABS: 20.0000 mg | ORAL_TABLET | Freq: Every day | ORAL | 1 refills | Status: DC

## 2023-06-09 MED ORDER — LOXAPINE SUCCINATE 10 MG PO CAPS: 10.0000 mg | ORAL_CAPSULE | Freq: Every day | ORAL | 1 refills | Status: DC

## 2023-06-09 NOTE — Progress Notes (Signed)
BH MD Outpatient Progress Note  06/09/2023 4:51 PM [name]  MRN:  [id number]  Assessment:  Kelly Beasley presents for follow-up evaluation in-person. Today, 06/09/23, patient reports depressive symptoms, detailed below. In addition to poor sleep, patient endorses feeling on edge, and with racing thoughts throughout the day and night. These thoughts prevent her from getting sufficient sleep. Of manic episodes, patient reports her last one was earlier this year, in which she had 7 days of elevated energy, rapid speech, impulsivity, and two days of decreased need for sleep. Patient also reports symptoms of psychosis including AVH as well as thought insertion that have been ongoing since the beginning of 2024 and the middle of 2023, respectively. Putting this picture together, patient's dx at this time is more consistent with schizoaffective disorder, bipolar type. Considered that in 10/2022, patient experienced some psychosis while delirious 2/2 hyperglycemia, but these sx have been persistent and occur when blood glucose is in a normal range, and also occur outside of mood sx. As well, patient is within the age range of the bimodal onset of schizophrenia in female patients. As psychiatric diagnoses can be dynamic, will continue to assess in subsequent visits to ensure correct diagnosis.  Patient with AIMS of 6 2/2 mild movements of muscles of facial expression, perioral movements, and movements within her neck. Cogentin has been ineffective in addressing these symptoms.   Patient has a hx of requiring maximal dosages of medications and developing tolerance. As she trialed Seroquel and now Olanzapine, believe it more appropriate to move to an FGA, particularly with schizophrenia spectrum dx. Considered Haldol, but will opt for an agent less likely to worsen TD sx, Loxapine. If patient has insufficient response to Loxapine, will instead consider Abilify, as it has a different receptor profile from the  other two SGAs.  Identifying Information: Kelly Beasley is a 52 y.o. female with a history of bipolar 1 disorder, GAD, and panic disorder who is an established patient with [facility] for management of depression, anxiety, and insomnia.   Risk Assessment: An assessment of suicide and violence risk factors was performed as part of this evaluation and is not significantly changed from the last visit.             While future psychiatric events cannot be accurately predicted, the patient does not  currently require acute inpatient psychiatric care and does not currently meet [facility] involuntary commitment criteria.          Plan:  # Schizoaffective Disorder, bipolar type Past medication trials:  Status of problem: Fairly managed Interventions: -- Decrease to Olanzapine 20 mg at bedtime -- START Loxapine 10 mg daily  # GAD Past medication trials:  Status of problem: Fairly managed Interventions: -- Decrease to BuSpar 7.5 mg BID -- Continue Effexor 225 mg daily -- Continue Hydroxyzine 25-50 mg TID PRN   # Tardive Dyskinesia Past medication trials: Cogentin Status of problem: Unstable Interventions: -- START Ingrezza 40 mg daily.  Return to care in 4-6 weeks  Patient was given contact information for behavioral health clinic and was instructed to call 911 for emergencies.    Patient and plan of care will be discussed with the Attending MD ,Dr. Josephina Shih, who agrees with the above statement and plan.   Subjective:  Chief Complaint:  Chief Complaint  Patient presents with   Anxiety   Depression   Insomnia   Follow-up    Interval History: Today, patient reports feeling "tired." She has  been compliant with all medications.  Working two full time jobs, dealing with depressed mood and racing thoughts, difficulty staying asleep. 5 hours of sleep, not enough. 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 Worries throughout the day affecting her focus.  Gotten worse since last visit, now constant. Loss of control of thoughts, unable to relax. Feeling on edge. Also feeling down and depressed, anhedonic (last enjoyable activity was months ago). Low energy, Appetite low- 1 meal daily, lunch, but she otherwise snacks or doesn't have an appetite. She endorses some passive SI, thoughts "others would be better off without me." Her kids (29 and 30) are a protective factor.. No HI.  She reports AH one week ago, conversations inside her head between female voices, not talking to her and no commands. She also endorses VH of shadows. She both hears and sees them even when in a good mood. First time experienced was a few months ago, earleir 2024. She  denies triggers or traumatic experiences at the time. The AVH does worsen with mood. Additionally, she feels like someone else is controlling thoughts, putting thoughts into her head, which has been ongoing for  about a year.   Last manic episode was a few months ago lasting 7 days: two days without sleep but energy ramping up about 5 days before. She was "all over the place" impulsive, cleaning, moving things. Zyprexa helps sometimes with calming  her down, but she believes she is developing medication tolerance.    Medications have helped a little with anxiety.    Visit Diagnosis:    ICD-10-CM   1. Schizoaffective disorder, bipolar type (HCC)  F25.0 busPIRone (BUSPAR) 7.5 MG tablet    OLANZapine (ZYPREXA) 20 MG tablet    venlafaxine XR (EFFEXOR XR) 75 MG 24 hr capsule    2. GAD (generalized anxiety disorder)  F41.1 busPIRone (BUSPAR) 7.5 MG tablet    venlafaxine XR (EFFEXOR XR) 75 MG 24 hr capsule    3. Panic disorder  F41.0 hydrOXYzine (VISTARIL) 25 MG capsule    venlafaxine XR (EFFEXOR XR) 75 MG 24 hr capsule      Past Psychiatric History:  Diagnoses: bipolar 1 disorder Medication trials: Seroquel, Prozac Previous psychiatrist/therapist: Dr. Michae Kava Hospitalizations: Denies Suicide attempts:  denies SIB: Denies Hx of violence towards others: Denies Current access to guns: Denies Hx of trauma/abuse: Denies Substance use: Denies  Past Medical History:  Past Medical History:  Diagnosis Date   Anxiety    Bipolar disorder (HCC)    Depression    No past surgical history on file.  Family Psychiatric History: Mom-chronic insomnia  Family History:  Family History  Problem Relation Age of Onset   Cancer Father     Social History:  Academic/Vocational:  Social History   Socioeconomic History   Marital status: Divorced    Spouse name: Not on file   Number of children: Not on file   Years of education: Not on file   Highest education level: Not on file  Occupational History   Not on file  Tobacco Use   Smoking status: Never   Smokeless tobacco: Never  Vaping Use   Vaping status: Never Used  Substance and Sexual Activity   Alcohol use: No   Drug use: No   Sexual activity: Never    Birth control/protection: Surgical  Other Topics Concern   Not on file  Social History Narrative   Not on file   Social Determinants of Health   Financial Resource Strain: Not on file  Food Insecurity: Not on file  Transportation Needs: Not on file  Physical Activity: Not on file  Stress: Not on file  Social Connections: Not on file    Allergies: No Known Allergies  Current Medications: Current Outpatient Medications  Medication Sig Dispense Refill   benztropine (COGENTIN) 0.5 MG tablet Take 1 tablet (0.5 mg total) by mouth 2 (two) times daily. 180 tablet 0   blood glucose meter kit and supplies KIT Dispense based on patient and insurance preference. Use up to four times daily as directed. 1 each 0   loxapine (LOXITANE) 10 MG capsule Take 1 capsule (10 mg total) by mouth at bedtime. 30 capsule 1   metFORMIN (GLUCOPHAGE) 500 MG tablet Take 1 tablet (500 mg total) by mouth daily. 30 tablet 0   terbinafine (LAMISIL) 250 MG tablet Take 1 tablet (250 mg total) by mouth daily. 90  tablet 0   valbenazine (INGREZZA) 40 MG capsule Take 1 capsule (40 mg total) by mouth daily. 30 capsule 1   busPIRone (BUSPAR) 7.5 MG tablet Take 1 tablet (7.5 mg total) by mouth 2 (two) times daily. 60 tablet 1   fluticasone (FLONASE) 50 MCG/ACT nasal spray Place 2 sprays into both nostrils daily. (Patient not taking: Reported on 06/09/2023) 16 mL 0   glipiZIDE (GLUCOTROL) 5 MG tablet Take 1 tablet (5 mg total) by mouth daily. (Patient not taking: Reported on 06/09/2023) 30 tablet 0   hydrOXYzine (VISTARIL) 25 MG capsule Take 1-2 tabs po qHS prn insomnia 180 capsule 0   OLANZapine (ZYPREXA) 20 MG tablet Take 1 tablet (20 mg total) by mouth at bedtime. 30 tablet 1   venlafaxine XR (EFFEXOR XR) 75 MG 24 hr capsule Take 3 capsules (225 mg total) by mouth daily. 90 capsule 1   No current facility-administered medications for this visit.    ROS: Review of Systems  Objective:  Psychiatric Specialty Exam: There were no vitals taken for this visit.There is no height or weight on file to calculate BMI.  General Appearance: Casual  Eye Contact:  Good  Speech:  Clear and Coherent and Normal Rate  Volume:  Normal  Mood:   "Tired"  Affect:  Appropriate  Thought Content: Logical, Hallucinations: Auditory Visual, and Ideas of Reference:   Delusions; mostly logical  Suicidal Thoughts:  Yes.  without intent/plan, chronic and passive  Homicidal Thoughts:  No  Thought Process:  Coherent  Orientation:  Full (Time, Place, and Person)    Memory: Immediate;   Good Recent;   Fair Remote;   Poor  Judgment:  Good  Insight:  Fair  Concentration:  Concentration: Good and Attention Span: Good  Recall: not formally assessed   Fund of Knowledge: Fair  Language: Good  Psychomotor Activity:  TD  Akathisia:  No  AIMS (if indicated): done  Assets:  Communication Skills Desire for Improvement Housing Leisure Time Social Support Vocational/Educational  ADL's:  Intact  Cognition: WNL  Sleep:  Poor    PE: General: well-appearing; no acute distress  Pulm: no increased work of breathing on room air  Strength & Muscle Tone: within normal limits Neuro: no focal neurological deficits observed  Gait & Station: normal  Metabolic Disorder Labs: Lab Results  Component Value Date   HGBA1C >15.5 (H) 10/27/2022   MPG >398 10/27/2022   No results found for: "PROLACTIN" No results found for: "CHOL", "TRIG", "HDL", "CHOLHDL", "VLDL", "LDLCALC" No results found for: "TSH"  Therapeutic Level Labs: No results found for: "LITHIUM" No results found for: "  VALPROATE" No results found for: "CBMZ"  Screenings: PHQ2-9    Flowsheet Row Office Visit [facility]09/20[facility] Video Visit from 03/13/2023 in [facility] Video Visit from 01/16/2023 in [facility] Video Visit from 09/12/2022 in [facility] Video Visit from 05/30/2022 in [facility]  PHQ-2 Total Score 4 2 2 2 2   PHQ-9 Total Score 18 5 12 8 5       Flowsheet Row Office Visit from 06/09/2023 in [facility] Video Visit from 03/13/2023 in [facility] Video Visit from 01/16/2023 in [facility]  C-[facility] RISK CATEGORY High Risk Error: Q3, 4, or 5 should not be populated when Q2 is No Error: Q3, 4, or 5 should not be populated when Q2 is No       Collaboration of Care: Collaboration of Care: Psychiatrist AEB This Clinical research associate to assume psychiatric care  Patient/Guardian was advised Release of Information must be obtained prior to any record release in order to collaborate their care with an outside provider. Patient/Guardian was advised if they have not already done so to contact the registration department to sign all necessary forms in  order for [place] to release information regarding their care.   Consent: Patient/Guardian gives verbal consent for treatment and assignment of benefits for services provided during this visit. Patient/Guardian expressed understanding and agreed to proceed.   A total of 45 minutes was spent involved in face to face clinical care, chart review, documentation.   Lamar Sprinkles, MD 06/09/2023, 4:51 PM

## 2023-06-10 ENCOUNTER — Other Ambulatory Visit (HOSPITAL_COMMUNITY): Payer: Self-pay

## 2023-06-11 ENCOUNTER — Other Ambulatory Visit (HOSPITAL_COMMUNITY): Payer: Self-pay

## 2023-06-11 DIAGNOSIS — G2401 Drug induced subacute dyskinesia: Secondary | ICD-10-CM

## 2023-06-11 MED ORDER — VALBENAZINE TOSYLATE 40 MG PO CAPS
40.0000 mg | ORAL_CAPSULE | Freq: Every day | ORAL | 1 refills | Status: AC
Start: 2023-06-11 — End: 2023-08-10

## 2023-06-12 NOTE — Addendum Note (Signed)
Addended by: Theodoro Kos A on: 06/12/2023 12:34 PM   Modules accepted: Level of Service

## 2023-06-15 ENCOUNTER — Other Ambulatory Visit (HOSPITAL_COMMUNITY): Payer: Self-pay | Admitting: Psychiatry

## 2023-06-15 DIAGNOSIS — G4700 Insomnia, unspecified: Secondary | ICD-10-CM

## 2023-06-15 DIAGNOSIS — F411 Generalized anxiety disorder: Secondary | ICD-10-CM

## 2023-06-15 DIAGNOSIS — F3130 Bipolar disorder, current episode depressed, mild or moderate severity, unspecified: Secondary | ICD-10-CM

## 2023-07-04 ENCOUNTER — Other Ambulatory Visit (HOSPITAL_COMMUNITY): Payer: Self-pay | Admitting: Student

## 2023-07-04 DIAGNOSIS — F250 Schizoaffective disorder, bipolar type: Secondary | ICD-10-CM

## 2023-07-04 DIAGNOSIS — F411 Generalized anxiety disorder: Secondary | ICD-10-CM

## 2023-07-04 DIAGNOSIS — F410 Panic disorder [episodic paroxysmal anxiety] without agoraphobia: Secondary | ICD-10-CM

## 2023-08-11 ENCOUNTER — Ambulatory Visit (HOSPITAL_BASED_OUTPATIENT_CLINIC_OR_DEPARTMENT_OTHER): Payer: Commercial Managed Care - PPO | Admitting: Student

## 2023-08-11 ENCOUNTER — Encounter (HOSPITAL_COMMUNITY): Payer: Self-pay | Admitting: Student

## 2023-08-11 VITALS — BP 120/82 | HR 90 | Ht 67.0 in | Wt 198.0 lb

## 2023-08-11 DIAGNOSIS — F411 Generalized anxiety disorder: Secondary | ICD-10-CM | POA: Diagnosis not present

## 2023-08-11 DIAGNOSIS — G2401 Drug induced subacute dyskinesia: Secondary | ICD-10-CM | POA: Diagnosis not present

## 2023-08-11 DIAGNOSIS — F250 Schizoaffective disorder, bipolar type: Secondary | ICD-10-CM | POA: Diagnosis not present

## 2023-08-11 DIAGNOSIS — T43505A Adverse effect of unspecified antipsychotics and neuroleptics, initial encounter: Secondary | ICD-10-CM

## 2023-08-11 DIAGNOSIS — F410 Panic disorder [episodic paroxysmal anxiety] without agoraphobia: Secondary | ICD-10-CM | POA: Diagnosis not present

## 2023-08-11 MED ORDER — OLANZAPINE 10 MG PO TABS
10.0000 mg | ORAL_TABLET | Freq: Every day | ORAL | 1 refills | Status: DC
Start: 2023-08-11 — End: 2023-09-22

## 2023-08-11 MED ORDER — VALBENAZINE TOSYLATE 60 MG PO CAPS
60.0000 mg | ORAL_CAPSULE | Freq: Every day | ORAL | 1 refills | Status: AC
Start: 2023-08-11 — End: 2023-10-10

## 2023-08-11 MED ORDER — LOXAPINE SUCCINATE 10 MG PO CAPS
10.0000 mg | ORAL_CAPSULE | Freq: Two times a day (BID) | ORAL | 1 refills | Status: DC
Start: 2023-08-11 — End: 2023-09-22

## 2023-08-11 MED ORDER — VENLAFAXINE HCL ER 75 MG PO CP24
225.0000 mg | ORAL_CAPSULE | Freq: Every day | ORAL | 1 refills | Status: DC
Start: 2023-08-11 — End: 2023-10-08

## 2023-08-11 MED ORDER — HYDROXYZINE PAMOATE 25 MG PO CAPS
ORAL_CAPSULE | ORAL | 0 refills | Status: DC
Start: 2023-08-11 — End: 2024-01-07

## 2023-08-11 NOTE — Progress Notes (Signed)
BH MD Outpatient Progress Note  08/11/2023 3:05 PM Kelly Beasley  MRN:  [id number]  Assessment:  Kelly Beasley presents for follow-up evaluation in-person. Today, 08/11/23, patient reports improvement to aforementioned depressive symptoms. She has had a fair mood and significant improvement in sleep. Although, she does continue to endorse poor appetite (although she has realized that she must eat with her medications), decreased energy, some passive SI, and anhedonia. Her anxiety remains elevated due to familial and work stressors. Although the racing thoughts at bedtime have improved. She has had improvements to the auditory hallucinations, with quieter voices and primarily hearing her name called. She does continue to note visual hallucinations in her periphery only. She otherwise notes marginal additional improvements.   Patient with AIMS of 2 due to mild perioral movements only. Significant improvements to movements of muscles of facial expression and movements within her neck. Ingrezza has proved beneficial in helping with these involuntary movements.   As patient with some noted improvements since beginning Loxapine, will continue cross titration from Olanzapine to Loxapine.  From previous note 7/15: Considered that in 10/2022, patient experienced some psychosis while delirious 2/2 hyperglycemia, but these sx have been persistent and occur when blood glucose is in a normal range, and also occur outside of mood sx. As well, patient is within the age range of the bimodal onset of schizophrenia in female patients. As psychiatric diagnoses can be dynamic, will continue to assess in subsequent visits to ensure correct diagnosis.  Patient has a hx of requiring maximal dosages of medications and developing tolerance. As she trialed Seroquel and now Olanzapine, believe it more appropriate to move to an FGA, particularly with schizophrenia spectrum dx. Considered Haldol, but will opt for an agent less  likely to worsen TD sx, Loxapine. If patient has insufficient response to Loxapine, will instead consider Abilify, as it has a different receptor profile from the other two SGAs.  Identifying Information: Kelly Beasley is a 52 y.o. female with a history of bipolar 1 disorder, GAD, and panic disorder who is an established patient with [facility] for management of depression, anxiety, and insomnia.   Risk Assessment: An assessment of suicide and violence risk factors was performed as part of this evaluation and is not significantly changed from the last visit.             While future psychiatric events cannot be accurately predicted, the patient does not  currently require acute inpatient psychiatric care and does not currently meet [facility] involuntary commitment criteria.          Plan:  # Schizoaffective Disorder, bipolar type Past medication trials:  Status of problem: Fairly managed Interventions: -- Decrease to Olanzapine 10 mg at bedtime -- Increase to Loxapine 10 mg BID  # GAD Past medication trials:  Status of problem: Fairly managed Interventions: -- Previously discontinued BuSpar 7.5 mg BID -- Continue Effexor 225 mg daily -- Continue Hydroxyzine 25-50 mg TID PRN   # Tardive Dyskinesia Past medication trials: Cogentin Status of problem: Unstable Interventions: -- Increase to Ingrezza 60 mg daily.  Return to care in 4-6 weeks  Patient was given contact information for behavioral health clinic and was instructed to call 911 for emergencies.    Patient and plan of care will be discussed with the Attending MD ,Dr. Mercy Riding, who agrees with the above statement and plan.   Subjective:  Chief Complaint:  Chief Complaint  Patient presents with   Follow-up  Schizophrenia   Anxiety    Interval History: Patient reports doing overall "fairly" today, as her anxiety has been increased. She is taking Hydroxyzine 25 mg 2-4 times per day. She  has had an increase in stressors with her sons going through divorces and unable to see her grandchildren as often. She is also working two fulltime jobs. She has noted some improvement in AVH. She also has improvements in sleep. She reports snacking but not eating full meals. She still reports fatigue. She has passive SI "my kids would be better off without me" or "what's the point?" Denies HI. Her AH are less, and VH only peripheral but nothing there when she looks in the direction.   She is still endorsing racing thoughts.   She notes huge improvement in involuntary movements. She had some nausea and vomiting with Ingrezza, without eating, which began to resolve when taking with food. She denies akathisia, tremors, abdominal pain, diarrhea or constipation.   She denies tobacco, alcohol, and illicit drug use.   Visit Diagnosis:    ICD-10-CM   1. Schizoaffective disorder, bipolar type (HCC)  F25.0 loxapine (LOXITANE) 10 MG capsule    OLANZapine (ZYPREXA) 10 MG tablet    2. GAD (generalized anxiety disorder)  F41.1 hydrOXYzine (VISTARIL) 25 MG capsule    venlafaxine XR (EFFEXOR XR) 75 MG 24 hr capsule    3. Panic disorder  F41.0 hydrOXYzine (VISTARIL) 25 MG capsule    venlafaxine XR (EFFEXOR XR) 75 MG 24 hr capsule    4. Neuroleptic-induced tardive dyskinesia  G24.01 valbenazine (INGREZZA) 60 MG capsule   T43.505A        Past Psychiatric History:  Diagnoses: bipolar 1 disorder Medication trials: Seroquel, Prozac Previous psychiatrist/therapist: Dr. Michae Kava Hospitalizations: Denies Suicide attempts: denies SIB: Denies Hx of violence towards others: Denies Current access to guns: Denies Hx of trauma/abuse: Denies Substance use: Denies  Past Medical History:  Past Medical History:  Diagnosis Date   Anxiety    Bipolar disorder (HCC)    Depression    No past surgical history on file.  Family Psychiatric History: Mom-chronic insomnia  Family History:  Family History   Problem Relation Age of Onset   Cancer Father     Social History:  Academic/Vocational:  Social History   Socioeconomic History   Marital status: Divorced    Spouse name: Not on file   Number of children: Not on file   Years of education: Not on file   Highest education level: Not on file  Occupational History   Not on file  Tobacco Use   Smoking status: Never   Smokeless tobacco: Never  Vaping Use   Vaping status: Never Used  Substance and Sexual Activity   Alcohol use: No   Drug use: No   Sexual activity: Never    Birth control/protection: Surgical  Other Topics Concern   Not on file  Social History Narrative   Not on file   Social Determinants of Health   Financial Resource Strain: Not on file  Food Insecurity: Not on file  Transportation Needs: Not on file  Physical Activity: Not on file  Stress: Not on file  Social Connections: Not on file    Allergies: No Known Allergies  Current Medications: Current Outpatient Medications  Medication Sig Dispense Refill   blood glucose meter kit and supplies KIT Dispense based on patient and insurance preference. Use up to four times daily as directed. 1 each 0   metFORMIN (GLUCOPHAGE) 500 MG tablet Take  1 tablet (500 mg total) by mouth daily. 30 tablet 0   terbinafine (LAMISIL) 250 MG tablet Take 1 tablet (250 mg total) by mouth daily. 90 tablet 0   valbenazine (INGREZZA) 60 MG capsule Take 1 capsule (60 mg total) by mouth daily. 30 capsule 1   fluticasone (FLONASE) 50 MCG/ACT nasal spray Place 2 sprays into both nostrils daily. (Patient not taking: Reported on 06/09/2023) 16 mL 0   glipiZIDE (GLUCOTROL) 5 MG tablet Take 1 tablet (5 mg total) by mouth daily. (Patient not taking: Reported on 06/09/2023) 30 tablet 0   hydrOXYzine (VISTARIL) 25 MG capsule Take 1-2 tabs po qHS prn insomnia 180 capsule 0   loxapine (LOXITANE) 10 MG capsule Take 1 capsule (10 mg total) by mouth 2 (two) times daily. 60 capsule 1   OLANZapine  (ZYPREXA) 10 MG tablet Take 1 tablet (10 mg total) by mouth at bedtime. 30 tablet 1   venlafaxine XR (EFFEXOR XR) 75 MG 24 hr capsule Take 3 capsules (225 mg total) by mouth daily. 90 capsule 1   No current facility-administered medications for this visit.    ROS: Review of Systems  Objective:  Psychiatric Specialty Exam: Blood pressure 120/82, pulse 90, height 5\' 7"  (1.702 m), weight 198 lb (89.8 kg), SpO2 95%.Body mass index is 31.01 kg/m.  General Appearance: Casual  Eye Contact:  Good  Speech:  Clear and Coherent and Normal Rate  Volume:  Normal  Mood:   "It's fair"  Affect:  Appropriate  Thought Content: Hallucinations: Auditory Visual and Ideas of Reference:   Delusions; mostly logical  Suicidal Thoughts:  Yes.  without intent/plan, chronic and passive  Homicidal Thoughts:  No  Thought Process:  Coherent  Orientation:  Full (Time, Place, and Person)    Memory: Immediate;   Good Recent;   Fair Remote;   Poor  Judgment:  Good  Insight:  Fair  Concentration:  Concentration: Good and Attention Span: Good  Recall: not formally assessed   Fund of Knowledge: Fair  Language: Good  Psychomotor Activity:  TD  Akathisia:  No  AIMS (if indicated): done; AIMS = 2  Assets:  Communication Skills Desire for Improvement Housing Leisure Time Social Support Vocational/Educational  ADL's:  Intact  Cognition: WNL  Sleep:  Fair   PE: General: well-appearing; no acute distress  Pulm: no increased work of breathing on room air  Strength & Muscle Tone: within normal limits Neuro: no focal neurological deficits observed  Gait & Station: normal  Metabolic Disorder Labs: Lab Results  Component Value Date   HGBA1C >15.5 (H) 10/27/2022   MPG >398 10/27/2022   No results found for: "PROLACTIN" No results found for: "CHOL", "TRIG", "HDL", "CHOLHDL", "VLDL", "LDLCALC" No results found for: "TSH"  Therapeutic Level Labs: No results found for: "LITHIUM" No results found for:  "VALPROATE" No results found for: "CBMZ"  Screenings: PHQ2-9    Flowsheet Row [facility] Video Visit from 03/13/2023 in [facility] Video Visit from 01[facility]16/2023 in [facility] Video Visit from 09[facility]12/2022 in [facility] Video Visit from 05[facility]30/2022 in [facility]  PHQ-2 Total Score 4 2 2 2 2   PHQ-9 Total Score 18 5 12 8 5       Flowsheet Row Office Visit from 06/09/2023 in [facility] Video Visit from 03/13/2023 in [facility] Video Visit from [facility]/16/2023 [facility]  C-SSRS RISK CATEGORY High Risk Error: Q3, 4, or 5 should not be populated when Q2 is No Error: Q3, 4, or 5 should not be populated when Q2 is No       Collaboration of Care: Collaboration of Care: Dr. Mercy Riding  Patient/Guardian was advised Release of Information must be obtained prior to any record release in order to collaborate their care with an outside provider. Patient/Guardian was advised if they have not already done so to contact the registration department to sign all necessary forms in order for [place] to release information regarding their care.   Consent: Patient/Guardian gives verbal consent for treatment and assignment of benefits for services provided during this visit. Patient/Guardian expressed understanding and agreed to proceed.   A total of 30 minutes was spent involved in face to face clinical care, chart review, documentation.   Lamar Sprinkles, MD 08/11/2023, 3:05 PM

## 2023-08-12 NOTE — Addendum Note (Signed)
Addended by: Everlena Cooper on: 08/12/2023 01:51 PM   Modules accepted: Level of Service

## 2023-08-25 ENCOUNTER — Other Ambulatory Visit (HOSPITAL_COMMUNITY): Payer: Self-pay | Admitting: Student

## 2023-08-25 DIAGNOSIS — F250 Schizoaffective disorder, bipolar type: Secondary | ICD-10-CM

## 2023-08-26 ENCOUNTER — Other Ambulatory Visit (HOSPITAL_COMMUNITY): Payer: Self-pay | Admitting: Student

## 2023-08-26 DIAGNOSIS — F250 Schizoaffective disorder, bipolar type: Secondary | ICD-10-CM

## 2023-09-22 ENCOUNTER — Ambulatory Visit (HOSPITAL_COMMUNITY): Payer: Commercial Managed Care - PPO | Admitting: Student

## 2023-09-22 DIAGNOSIS — G2401 Drug induced subacute dyskinesia: Secondary | ICD-10-CM | POA: Diagnosis not present

## 2023-09-22 DIAGNOSIS — F250 Schizoaffective disorder, bipolar type: Secondary | ICD-10-CM

## 2023-09-22 DIAGNOSIS — T43505A Adverse effect of unspecified antipsychotics and neuroleptics, initial encounter: Secondary | ICD-10-CM | POA: Diagnosis not present

## 2023-09-22 NOTE — Progress Notes (Unsigned)
BH MD Outpatient Progress Note  09/22/2023 4:08 PM Kelly Beasley  MRN:  [id number]  Assessment:  Kelly Beasley presents for follow-up evaluation in-person. Today, 09/22/23, patient reports sustained improvements to depressive symptoms and anxiety, particularly as her sons are dealing with their relationship issues much better. She has had a "pretty good" mood and significant improvement in sleep. She has also had some improvement in her appetite and anhedonia. She now has the desire to do things for enjoyment but lacks time. She continues to endorse decreased energy (although due to working every day), some passive SI (chronic). Her anxiety is less as familial stressors have improved. She has had sustained improvements to the auditory hallucinations, with quieter chatter and primarily hearing her name called. She does continue to note visual hallucinations primarily of shadows in her periphery and a couple of times of a black shadow hovering over her when she awoke. She notes significant improvements overall.   Patient with AIMS of 2 again due to mild perioral movements only. Significant improvements to movements of muscles of facial expression and movements within her neck. Ingrezza has proved beneficial in helping with these involuntary movements. Will continue at current dosage.   As patient with sustained improvements since beginning Loxapine, will continue cross titration from Olanzapine to Loxapine.  From previous note 7/15: Considered that in 10/2022, patient experienced some psychosis while delirious 2/2 hyperglycemia, but these sx have been persistent and occur when blood glucose is in a normal range, and also occur outside of mood sx. As well, patient is within the age range of the bimodal onset of schizophrenia in female patients. As psychiatric diagnoses can be dynamic, will continue to assess in subsequent visits to ensure correct diagnosis.  Patient has a hx of requiring maximal  dosages of medications and developing tolerance. As she trialed Seroquel and now Olanzapine, believe it more appropriate to move to an FGA, particularly with schizophrenia spectrum dx. Considered Haldol, but will opt for an agent less likely to worsen TD sx, Loxapine. If patient has insufficient response to Loxapine, will instead consider Abilify, as it has a different receptor profile from the other two SGAs.  Identifying Information: Kelly Beasley is a 52 y.o. female with a history of schizoaffective disorder-bipolar type, GAD, and panic disorder who is an established patient with [facility] for management of depression, anxiety, and insomnia.   Risk Assessment: An assessment of suicide and violence risk factors was performed as part of this evaluation and is not significantly changed from the last visit.             While future psychiatric events cannot be accurately predicted, the patient does not  currently require acute inpatient psychiatric care and does not currently meet [facility] involuntary commitment criteria.          Plan:  # Schizoaffective Disorder, bipolar type Status of problem: Fairly managed Interventions: -- Decrease to Olanzapine 5 mg at bedtime -- Increase to Loxapine 15 mg BID  # GAD Status of problem: Fairly managed Interventions: -- Continue Effexor 225 mg daily -- Continue Hydroxyzine 25-50 mg TID PRN   # Tardive Dyskinesia Past medication trials: Cogentin Status of problem: Stable Interventions: -- Continue Ingrezza 60 mg daily.  Return to care in 8 weeks  Patient was given contact information for behavioral health clinic and was instructed to call 911 for emergencies.    Patient and plan of care will be discussed with the Attending MD ,Dr. Mercy Riding,  who agrees with the above statement and plan.   Subjective:  Chief Complaint:  No chief complaint on file.   Interval History: Patient reports doing overall "pretty good"  today, as her anxiety has been much better. She is still having stressors from her sons, but this is less as they are improving their relationships.   Within the past week, she ran out of Effexor. She has had HA, some nausea, vomiting, and diarrhea. She is taking Hydroxyzine 25 mg 2 tablets at bedtime. She has maintained compliance with loxapine, ingrezza, and zyprexa.   She has noted sustained improvements in AVH. She sometimes hears her name called or a doorbell ringing, and consistently hears background chatter, which has quieted. VH black shadow twice since last visit. She awakened to a black shadow hovering over her, both times. She also has sustained improvements in sleep. She reports snacking but she is now eating a full meal for lunch. She is working daily and does have some difficult focusing and maintaining energy, which she attributes to hectic work schedule. She continues to endorse passive SI "my kids would be better off without me" but continues to insist she would not follow through on those thoughts. Denies HI.  She is still endorsing racing thoughts.   She notes huge improvement in involuntary movements. Experiences akathisia 2-3 times per week, but denies tremors. Her description of akathisia is uncertain in extent, as she states that she feels the urge to move at work, but she has a job that requires her to be on her feet. She does report that while at home and attempting to relax, she sometimes feels the urge to move; this is primarily after a long day of work.   She denies tobacco, alcohol, and illicit drug use.   Visit Diagnosis:    ICD-10-CM   1. Schizoaffective disorder, bipolar type (HCC)  F25.0 loxapine (LOXITANE) 10 MG capsule    OLANZapine (ZYPREXA) 5 MG tablet    2. Neuroleptic-induced tardive dyskinesia  G24.01    T43.505A         Past Psychiatric History:  Diagnoses: Schizoaffective disorder-bipolar type  Medication trials: Seroquel, Prozac Previous  psychiatrist/therapist: Dr. Michae Kava Hospitalizations: Denies Suicide attempts: denies SIB: Denies Hx of violence towards others: Denies Current access to guns: Denies Hx of trauma/abuse: Denies Substance use: Denies  Past Medical History:  Past Medical History:  Diagnosis Date   Anxiety    Bipolar disorder (HCC)    Bipolar I disorder, most recent episode depressed (HCC) 08/27/2018   Depression    No past surgical history on file.  Family Psychiatric History: Mom-chronic insomnia  Family History:  Family History  Problem Relation Age of Onset   Cancer Father     Social History:  Academic/Vocational:  Social History   Socioeconomic History   Marital status: Divorced    Spouse name: Not on file   Number of children: Not on file   Years of education: Not on file   Highest education level: Not on file  Occupational History   Not on file  Tobacco Use   Smoking status: Never   Smokeless tobacco: Never  Vaping Use   Vaping status: Never Used  Substance and Sexual Activity   Alcohol use: No   Drug use: No   Sexual activity: Never    Birth control/protection: Surgical  Other Topics Concern   Not on file  Social History Narrative   Not on file   Social Determinants of Health   Financial  Resource Strain: Not on file  Food Insecurity: Not on file  Transportation Needs: Not on file  Physical Activity: Not on file  Stress: Not on file  Social Connections: Not on file    Allergies: No Known Allergies  Current Medications: Current Outpatient Medications  Medication Sig Dispense Refill   hydrOXYzine (VISTARIL) 25 MG capsule Take 1-2 tabs po qHS prn insomnia 180 capsule 0   loxapine (LOXITANE) 5 MG capsule Take 1 capsule (5 mg total) by mouth 2 (two) times daily. Take with 10 mg capsule for total of 15 mg two times daily. 60 capsule 1   metFORMIN (GLUCOPHAGE) 500 MG tablet Take 1 tablet (500 mg total) by mouth daily. 30 tablet 0   valbenazine (INGREZZA) 60 MG  capsule Take 1 capsule (60 mg total) by mouth daily. 30 capsule 1   venlafaxine XR (EFFEXOR XR) 75 MG 24 hr capsule Take 3 capsules (225 mg total) by mouth daily. 90 capsule 1   blood glucose meter kit and supplies KIT Dispense based on patient and insurance preference. Use up to four times daily as directed. 1 each 0   fluticasone (FLONASE) 50 MCG/ACT nasal spray Place 2 sprays into both nostrils daily. (Patient not taking: Reported on 06/09/2023) 16 mL 0   glipiZIDE (GLUCOTROL) 5 MG tablet Take 1 tablet (5 mg total) by mouth daily. (Patient not taking: Reported on 06/09/2023) 30 tablet 0   loxapine (LOXITANE) 10 MG capsule Take 1 capsule (10 mg total) by mouth 2 (two) times daily. Take with 5 mg capsule for total of 15 mg two times daily. 60 capsule 1   OLANZapine (ZYPREXA) 5 MG tablet Take 1 tablet (5 mg total) by mouth at bedtime. 30 tablet 1   terbinafine (LAMISIL) 250 MG tablet Take 1 tablet (250 mg total) by mouth daily. (Patient not taking: Reported on 09/22/2023) 90 tablet 0   No current facility-administered medications for this visit.    ROS: Review of Systems Per HPI  Objective:  Psychiatric Specialty Exam: There were no vitals taken for this visit.There is no height or weight on file to calculate BMI.  General Appearance: Casual  Eye Contact:  Good  Speech:  Clear and Coherent and Normal Rate  Volume:  Normal  Mood:   "Pretty good"  Affect:  Appropriate  Thought Content: Hallucinations: Auditory Visual; mostly logical. Chronic VH  Suicidal Thoughts:  Yes.  without intent/plan, chronic and passive  Homicidal Thoughts:  No  Thought Process:  Coherent  Orientation:  Full (Time, Place, and Person)    Memory: Immediate;   Good Recent;   Fair Remote;   Poor  Judgment:  Fair  Insight:  Fair  Concentration:  Concentration: Good and Attention Span: Good  Recall: not formally assessed   Fund of Knowledge: Fair  Language: Good  Psychomotor Activity:  TD  Akathisia:  No   AIMS (if indicated): done; AIMS = 2  Assets:  Communication Skills Desire for Improvement Housing Leisure Time Social Support Vocational/Educational  ADL's:  Intact  Cognition: WNL  Sleep:  Fair, improving   PE: General: well-appearing; no acute distress  Pulm: no increased work of breathing on room air  Strength & Muscle Tone: within normal limits Neuro: no focal neurological deficits observed  Gait & Station: normal  Metabolic Disorder Labs: Lab Results  Component Value Date   HGBA1C >15.5 (H) 10/27/2022   MPG >398 10/27/2022   No results found for: "PROLACTIN" No results found for: "CHOL", "TRIG", "HDL", "CHOLHDL", "VLDL", "  LDLCALC" No results found for: "TSH"  Therapeutic Level Labs: No results found for: "LITHIUM" No results found for: "VALPROATE" No results found for: "CBMZ"  Screenings: PHQ2-9    Flowsheet Row Office Visit from [facility]/09/2023 in [facility] Video Visit from 03/13/2023 in [facility] Video Visit from 01[facility]16/2023 in [facility] Video Visit from 09[facility]12/2022 in [facility] Video Visit from 05/30/2022 in [facility]  PHQ-2 Total Score 4 2 2 2 2   PHQ-9 Total Score 18 5 12 8 5       Flowsheet Row Office Visit from 06/09/2023 in [facility] Video Visit from 03/13/2023 in [facility] Video Visit from 01/16/2023 in [facility]  C-SSRS RISK CATEGORY High Risk Error: Q3, 4, or 5 should not be populated when Q2 is No Error: Q3, 4, or 5 should not be populated when Q2 is No       Collaboration of Care: Collaboration of Care: Dr. Mercy Riding  Patient/Guardian was advised Release of Information must be obtained prior to any record release in order to collaborate  their care with an outside provider. Patient/Guardian was advised if they have not already done so to contact the registration department to sign all necessary forms in order for [place] to release information regarding their care.   Consent: Patient/Guardian gives verbal consent for treatment and assignment of benefits for services provided during this visit. Patient/Guardian expressed understanding and agreed to proceed.   A total of 30 minutes was spent involved in face to face clinical care, chart review, documentation.   Lamar Sprinkles, MD 09/22/2023 4:08 PM

## 2023-09-23 MED ORDER — LOXAPINE SUCCINATE 10 MG PO CAPS
10.0000 mg | ORAL_CAPSULE | Freq: Two times a day (BID) | ORAL | 1 refills | Status: DC
Start: 2023-09-23 — End: 2023-11-06

## 2023-09-23 MED ORDER — OLANZAPINE 5 MG PO TABS: 5.0000 mg | ORAL_TABLET | Freq: Every day | ORAL | 1 refills | Status: DC

## 2023-09-23 MED ORDER — LOXAPINE SUCCINATE 5 MG PO CAPS: 5.0000 mg | ORAL_CAPSULE | Freq: Two times a day (BID) | ORAL | 1 refills | Status: DC

## 2023-09-24 ENCOUNTER — Encounter (HOSPITAL_COMMUNITY): Payer: Self-pay | Admitting: Student

## 2023-09-25 NOTE — Addendum Note (Signed)
Addended by: Everlena Cooper on: 09/25/2023 01:17 PM   Modules accepted: Level of Service

## 2023-10-08 ENCOUNTER — Ambulatory Visit: Payer: Commercial Managed Care - PPO | Admitting: Internal Medicine

## 2023-10-08 ENCOUNTER — Other Ambulatory Visit: Payer: Self-pay | Admitting: Internal Medicine

## 2023-10-08 ENCOUNTER — Encounter: Payer: Self-pay | Admitting: Internal Medicine

## 2023-10-08 VITALS — BP 132/82 | HR 94 | Resp 17 | Ht 67.0 in | Wt 188.8 lb

## 2023-10-08 DIAGNOSIS — Z6829 Body mass index (BMI) 29.0-29.9, adult: Secondary | ICD-10-CM

## 2023-10-08 DIAGNOSIS — E1165 Type 2 diabetes mellitus with hyperglycemia: Secondary | ICD-10-CM | POA: Diagnosis not present

## 2023-10-08 MED ORDER — LANTUS SOLOSTAR 100 UNIT/ML ~~LOC~~ SOPN: 78.0000 [IU] | PEN_INJECTOR | Freq: Every day | SUBCUTANEOUS | 11 refills | Status: DC

## 2023-10-08 MED ORDER — METFORMIN HCL ER (OSM) 1000 MG PO TB24: 1000.0000 mg | ORAL_TABLET | Freq: Every day | ORAL | 5 refills | Status: DC

## 2023-10-08 MED ORDER — OZEMPIC (1 MG/DOSE) 4 MG/3ML ~~LOC~~ SOPN: 1.0000 mg | PEN_INJECTOR | SUBCUTANEOUS | Status: DC

## 2023-10-08 NOTE — Progress Notes (Addendum)
Office Visit  Subjective   Patient ID: Kelly Beasley   DOB: 1970/12/19   Age: 52 y.o.   MRN: [id number]   Chief Complaint Chief Complaint  Patient presents with   New Patient (Initial Visit)     History of Present Illness Kelly Beasley is a 52 y.o. female who comes in today to establish care and to discuss her diabetes.  She was previously followed by [facility] in [place] where she states she last saw them about 10 months ago.  She was diagnosed with T2 diabetes in 2023 where she was started on medications at that time.  She has been seen in the ED multiple times in the last year with either hyperglycemia or hypoglycemia.  She is currently on metformin 1000mg  BID and Lantus 78 units subcut daily and ozempic 1mg  subcut weekly.  She is also on novolog sliding scale which she only takes when her blood sugar are >200-300.  She states she uses this maybe once per week.  She states that the reason she was in the ER at [facility] last month for hypoglycemia is due to her not eating and her sugars going low.  She usually skips breakfast and will eat lunch and dinner or she may skip breakfast and lunch and eat at night.  She is not walking as much as they would like.. She specifically denies unexplained abdominal pain, nausea or vomiting or diarrhea.  She does check her blood sugars twice a day where she checks them before breakfast and lunch.  Her sugars before breakfast run 60-200 and before lunch run 140-400.  Her last HgBA1c was done on 10/2022 and was 15.5%. She has no long term complications of diabetic retinopathy, nephropathy, neuropathy or cardiovascular disease.  She has had some blurred vision and has an appointment to be seen by optometry next week.        Past Medical History Past Medical History:  Diagnosis Date   Anxiety    Bipolar disorder (HCC)    Bipolar I disorder, most recent episode depressed (HCC) 08/27/2018   Depression      Allergies No Known Allergies    Medications  Current Outpatient Medications:    metformin (FORTAMET) 1000 MG (OSM) 24 hr tablet, Take 1,000 mg by mouth daily with breakfast., Disp: , Rfl:    blood glucose meter kit and supplies KIT, Dispense based on patient and insurance preference. Use up to four times daily as directed., Disp: 1 each, Rfl: 0   fluticasone (FLONASE) 50 MCG/ACT nasal spray, Place 2 sprays into both nostrils daily. (Patient not taking: Reported on 06/09/2023), Disp: 16 mL, Rfl: 0   hydrOXYzine (VISTARIL) 25 MG capsule, Take 1-2 tabs po qHS prn insomnia, Disp: 180 capsule, Rfl: 0   loxapine (LOXITANE) 10 MG capsule, Take 1 capsule (10 mg total) by mouth 2 (two) times daily. Take with 5 mg capsule for total of 15 mg two times daily., Disp: 60 capsule, Rfl: 1   loxapine (LOXITANE) 5 MG capsule, Take 1 capsule (5 mg total) by mouth 2 (two) times daily. Take with 10 mg capsule for total of 15 mg two times daily., Disp: 60 capsule, Rfl: 1   valbenazine (INGREZZA) 60 MG capsule, Take 1 capsule (60 mg total) by mouth daily., Disp: 30 capsule, Rfl: 1   Review of Systems Review of Systems  Constitutional:  Negative for chills and fever.  Eyes:  Positive for blurred vision.  Respiratory:  Negative for cough and shortness of  breath.   Cardiovascular:  Negative for chest pain, palpitations and leg swelling.  Gastrointestinal:  Negative for abdominal pain, constipation, diarrhea, nausea and vomiting.  Genitourinary:  Negative for frequency.  Skin:  Negative for itching and rash.  Neurological:  Negative for dizziness, focal weakness and headaches.  Endo/Heme/Allergies:  Positive for polydipsia.       Objective:    Vitals BP 132/82   Pulse 94   Resp 17   Ht 5\' 7"  (1.702 m)   Wt 188 lb 12.8 oz (85.6 kg)   SpO2 96%   BMI 29.57 kg/m    Physical Examination Physical Exam Constitutional:      Appearance: Normal appearance. She is not ill-appearing.  Cardiovascular:     Rate and Rhythm: Normal rate and  regular rhythm.     Pulses: Normal pulses.     Heart sounds: No murmur heard.    No friction rub. No gallop.  Pulmonary:     Effort: Pulmonary effort is normal. No respiratory distress.     Breath sounds: No wheezing, rhonchi or rales.  Abdominal:     General: Bowel sounds are normal. There is no distension.     Palpations: Abdomen is soft.     Tenderness: There is no abdominal tenderness.  Musculoskeletal:     Right lower leg: No edema.     Left lower leg: No edema.  Skin:    General: Skin is warm and dry.     Findings: No rash.  Neurological:     Mental Status: She is alert.        Assessment & Plan:   Poorly controlled diabetes mellitus (HCC) She is having problems with hypoglycemia at times due to her diet.  She needs to eat 3 small meals a day and stay away from sugary food as she can.  I am going to refer her to the diabetic counsellor/educator to go over her diet and diabetes.  I am also going to try to get her setup for a CGM at this time.  Continue on her current meds and we will check her HgBA1c and adjust her meds once we get this back.    Return in about 3 months (around 01/08/2024).   Crist Fat, MD

## 2023-10-08 NOTE — Assessment & Plan Note (Signed)
She is having problems with hypoglycemia at times due to her diet.  She needs to eat 3 small meals a day and stay away from sugary food as she can.  I am going to refer her to the diabetic counsellor/educator to go over her diet and diabetes.  I am also going to try to get her setup for a CGM at this time.  Continue on her current meds and we will check her HgBA1c and adjust her meds once we get this back.

## 2023-10-09 LAB — LIPID PANEL
Chol/HDL Ratio: 4.2 ratio (ref 0.0–4.4)
Cholesterol, Total: 247 mg/dL — ABNORMAL HIGH (ref 100–199)
HDL: 59 mg/dL (ref >39)
LDL Chol Calc (NIH): 142 mg/dL — ABNORMAL HIGH (ref 0–99)
Triglycerides: 254 mg/dL — ABNORMAL HIGH (ref 0–149)
VLDL Cholesterol Cal: 46 mg/dL — ABNORMAL HIGH (ref 5–40)

## 2023-10-09 LAB — TSH: TSH: 3.59 u[IU]/mL (ref 0.450–4.500)

## 2023-10-09 LAB — HEMOGLOBIN A1C
Est. average glucose Bld gHb Est-mCnc: 126 mg/dL
Hgb A1c MFr Bld: 6 % — ABNORMAL HIGH (ref 4.8–5.6)

## 2023-10-14 ENCOUNTER — Other Ambulatory Visit: Payer: Self-pay

## 2023-10-14 MED ORDER — ATORVASTATIN CALCIUM 40 MG PO TABS: 40.0000 mg | ORAL_TABLET | Freq: Every day | ORAL | 2 refills | Status: DC

## 2023-10-14 NOTE — Progress Notes (Signed)
New Rx

## 2023-10-14 NOTE — Progress Notes (Signed)
The patient's blood sugars are controlled. Her cholesterol is not. She needs to start on lipitor 40mg  po at bedtime and come back in on her next visit fasting.   Patient is aware of labs, to fast at next visit and new med Lipitor

## 2023-10-20 ENCOUNTER — Other Ambulatory Visit: Payer: Self-pay | Admitting: Internal Medicine

## 2023-10-20 ENCOUNTER — Telehealth (HOSPITAL_COMMUNITY): Payer: Self-pay

## 2023-10-20 NOTE — Telephone Encounter (Signed)
Patient is calling about the increase in her Loxapine, patient states that she is not feeling right - some dizziness. She wants to know if she should continue with the increase or go back to what she was taking. Please review and advise, thank you

## 2023-10-21 ENCOUNTER — Other Ambulatory Visit (HOSPITAL_COMMUNITY): Payer: Self-pay | Admitting: Student

## 2023-10-21 NOTE — Telephone Encounter (Signed)
Called patient to clarify dizziness. She reports since increase from loxapine 10 mg bid to 15 mg bid, she has noticed feeling "off" for the past month. She endorses persistent feelings of dizziness as well and noticed her vision is somewhat blurry. Per PCP note, there is concern for possible diabetic retinopathy. Denies feeling nausea, vomiting, headache, chest pain. She endorses these symptoms only occurred after the increase to 15 and did not occur with the 10 mg twice a day. She states she stopped taking the medication this past weekend but then took it Monday but planned on not taking it today. I discussed it would be best to return to her former dose of 10 mg twice a day as while she was still experiencing hallucination, they are as of now tolerable. Advised to call again should she experience worsening of her psychosis or she experiences intolerable side effects. Denies any other safety concerns at this time  Plan Schizoaffective Disorder, Bipolar Type -Decrease loxapine to 10 mg bid -Reached out to front desk to see if she can be seen earlier than her next appointment with Dr. Alfonse Flavors on 12/01/23.

## 2023-11-05 ENCOUNTER — Ambulatory Visit (HOSPITAL_BASED_OUTPATIENT_CLINIC_OR_DEPARTMENT_OTHER): Payer: Commercial Managed Care - PPO | Admitting: Student

## 2023-11-05 VITALS — BP 138/78 | HR 90 | Ht 67.0 in | Wt 190.4 lb

## 2023-11-05 DIAGNOSIS — G2589 Other specified extrapyramidal and movement disorders: Secondary | ICD-10-CM

## 2023-11-05 DIAGNOSIS — F411 Generalized anxiety disorder: Secondary | ICD-10-CM | POA: Diagnosis not present

## 2023-11-05 DIAGNOSIS — Z79899 Other long term (current) drug therapy: Secondary | ICD-10-CM

## 2023-11-05 DIAGNOSIS — F250 Schizoaffective disorder, bipolar type: Secondary | ICD-10-CM

## 2023-11-05 DIAGNOSIS — G2401 Drug induced subacute dyskinesia: Secondary | ICD-10-CM | POA: Diagnosis not present

## 2023-11-05 DIAGNOSIS — T43505A Adverse effect of unspecified antipsychotics and neuroleptics, initial encounter: Secondary | ICD-10-CM

## 2023-11-05 NOTE — Progress Notes (Signed)
BH MD Outpatient Progress Note  11/05/2023 1:58 PM  [name]  MRN:  [id number]  Assessment:  Kelly Beasley presents for follow-up evaluation in-person. Today, 11/05/2023, patient reports inability to tolerate the increase in loxapine.  She called on 11/25 to report dizziness, heaviness in her gait, and nausea.  Afterward, she completely discontinued the medications.  She noticed resurgence of severe anxiety, increase in the volume of chronic auditory hallucinations, and increase in her visual hallucinations.  She also noticed worsening in akathisia.  This is apparent throughout the visit; she gets up to walk throughout the majority of the visit.  She will prefer to not restart loxapine at this time.  As discussed around the time that we were considering loxapine, we will trial Abilify.  As well, a trial of propranolol for both akathisia and anxiety.  Patient is agreeable to this plan.  Medication risk, benefits, and and adverse effects discussed with patient, and she is agreeable to a trial.  Patient manage chronic SI, but without intent nor plan.  Patient poses no safety threats to herself nor others at this time.  Involuntary movements remain improved Ingrezza.  Will make no changes to dosage at this time.   From previous note 7/15: Considered that in 10/2022, patient experienced some psychosis while delirious 2/2 hyperglycemia, but these sx have been persistent and occur when blood glucose is in a normal range, and also occur outside of mood sx. As well, patient is within the age range of the bimodal onset of schizophrenia in female patients. As psychiatric diagnoses can be dynamic, will continue to assess in subsequent visits to ensure correct diagnosis.  Patient has a hx of requiring maximal dosages of medications and developing tolerance. As she trialed Seroquel and now Olanzapine, believe it more appropriate to move to an FGA, particularly with schizophrenia spectrum dx. Considered  Haldol, but will opt for an agent less likely to worsen TD sx, Loxapine. If patient has insufficient response to Loxapine, will instead consider Abilify, as it has a different receptor profile from the other two SGAs.  Identifying Information: Kelly Beasley is a 52 y.o. female with a history of schizoaffective disorder-bipolar type, GAD, and panic disorder who is an established patient with [facility] for management of depression, anxiety, and insomnia.   Risk Assessment: An assessment of suicide and violence risk factors was performed as part of this evaluation and is not significantly changed from the last visit.             While future psychiatric events cannot be accurately predicted, the patient does not  currently require acute inpatient psychiatric care and does not currently meet [facility] involuntary commitment criteria.          Plan:  # Schizoaffective Disorder, bipolar type Status of problem: Fairly managed Interventions: -- Previously discontinued olanzapine -- Will formally discontinue loxapine due to adverse effects -- START Abilify 10 mg daily -- START propranolol 10 mg twice daily for akathisia and anxiety  # GAD Status of problem: Increased Interventions: -- Patient discontinued Effexor 225 mg daily -- Continue Hydroxyzine 25-50 mg TID PRN -- START propranolol 10 mg twice daily for anxiety and akathisia  # Tardive Dyskinesia Past medication trials: Cogentin Status of problem: Stable Interventions: -- Continue Ingrezza 60 mg daily.  Return to care in 4-6 weeks  Patient was given contact information for behavioral health clinic and was instructed to call 911 for emergencies.    Patient and plan  of care will be discussed with the Attending MD ,Dr. Mercy Riding, who agrees with the above statement and plan.   Subjective:  Chief Complaint:  Chief Complaint  Patient presents with   Follow-up   Schizophrenia   Medication Problem    Anxiety    Interval History: Patient reports doing overall "not so good" today, as her anxiety has been much better.  Due to decreased BP, she stopped taking the medication due to adverse effects with increase. Dizzy, walking funny (felt heavy and stiff), and seeing floaters. These have resolved since discontinuing. Now anxiety has returned, difficulty sitting still/urge to get up and walk, and AVH worsened (voices louder and seeing shadow people). Last Loxapine dose 11/25. She has been completely without antipsychotics since.  Still reports being under a lot of stress, especially with the holidays.   Denies active SI, passive SI is chronic. Kids are protective. Denies HI.  Denies GI problems, nausea. Takes Ingrezza nightly.   She denies tobacco, alcohol, and illicit drug use.   Visit Diagnosis:    ICD-10-CM   1. Schizoaffective disorder, bipolar type (HCC)  F25.0     2. 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 Long term current use of antipsychotic medication  Z79.899          Past Psychiatric History:  Diagnoses: Schizoaffective disorder-bipolar type  Medication trials: Seroquel, Prozac Previous psychiatrist/therapist: Dr. Michae Kava Hospitalizations: Denies Suicide attempts: denies SIB: Denies Hx of violence towards others: Denies Current access to guns: Denies Hx of trauma/abuse: Denies Substance use: Denies  Past Medical History:  Past Medical History:  Diagnosis Date   Anxiety    Bipolar disorder (HCC)    Bipolar I disorder, most recent episode depressed (HCC) 08/27/2018   Depression    No past surgical history on file.  Family Psychiatric History: Mom-chronic insomnia  Family History:  Family History  Problem Relation Age of Onset   Cancer Father     Social History:  Academic/Vocational:  Social History   Socioeconomic  History   Marital status: Divorced    Spouse name: Not on file   Number of children: Not on file   Years of education: Not on file   Highest education level: Not on file  Occupational History   Not on file  Tobacco Use   Smoking status: Never   Smokeless tobacco: Never  Vaping Use   Vaping status: Never Used  Substance and Sexual Activity   Alcohol use: No   Drug use: No   Sexual activity: Never    Birth control/protection: Surgical  Other Topics Concern   Not on file  Social History Narrative   Not on file   Social Drivers of Health   Financial Resource Strain: Not on file  Food Insecurity: Not on file  Transportation Needs: Not on file  Physical Activity: Not on file  Stress: Not on file  Social Connections: Not on file    Allergies: No Known Allergies  Current Medications: Current Outpatient Medications  Medication Sig Dispense Refill   ARIPiprazole (ABILIFY) 10 MG tablet Take 1 tablet (10 mg total) by mouth daily. 30 tablet 1   atorvastatin (LIPITOR) 40 MG tablet Take 1 tablet (40 mg total) by mouth daily. 60 tablet 2   blood glucose meter kit and supplies KIT Dispense based on patient and insurance preference.  Use up to four times daily as directed. 1 each 0   fluticasone (FLONASE) 50 MCG/ACT nasal spray Place 2 sprays into both nostrils daily. 16 mL 0   hydrOXYzine (VISTARIL) 25 MG capsule Take 1-2 tabs po qHS prn insomnia 180 capsule 0   INGREZZA 60 MG capsule Take 60 mg by mouth daily.     insulin glargine (LANTUS SOLOSTAR) 100 UNIT/ML Solostar Pen Inject 78 Units into the skin daily. 15 mL 11   metFORMIN (GLUCOPHAGE-XR) 500 MG 24 hr tablet Take 2 tablets (1,000 mg total) by mouth daily. 90 tablet 2   propranolol (INDERAL) 10 MG tablet Take 1 tablet (10 mg total) by mouth 2 (two) times daily. 60 tablet 1   Semaglutide, 1 MG/DOSE, (OZEMPIC, 1 MG/DOSE,) 4 MG/3ML SOPN Inject 1 mg into the skin once a week. (Patient not taking: Reported on 11/05/2023)     No  current facility-administered medications for this visit.    ROS: Review of Systems Per HPI  Objective:  Psychiatric Specialty Exam: Blood pressure 138/78, pulse 90, height 5\' 7"  (1.702 m), weight 190 lb 6.4 oz (86.4 kg).Body mass index is 29.82 kg/m.  General Appearance: Casual  Eye Contact:  Good  Speech:  Clear and Coherent and Normal Rate  Volume:  Normal  Mood:   "Pretty good"  Affect:  Appropriate  Thought Content: Hallucinations: Auditory Visual; mostly logical. Chronic VH  Suicidal Thoughts:  Yes.  without intent/plan, chronic and passive  Homicidal Thoughts:  No  Thought Process:  Coherent  Orientation:  Full (Time, Place, and Person)    Memory: Immediate;   Good Recent;   Fair Remote;   Poor  Judgment:  Fair  Insight:  Fair  Concentration:  Concentration: Good and Attention Span: Good  Recall: not formally assessed   Fund of Knowledge: Fair  Language: Good  Psychomotor Activity:  TD  Akathisia:  No  AIMS (if indicated): done; AIMS = 2  Assets:  Communication Skills Desire for Improvement Housing Leisure Time Social Support Vocational/Educational  ADL's:  Intact  Cognition: WNL  Sleep:  Fair, improving   PE: General: well-appearing; no acute distress  Pulm: no increased work of breathing on room air  Strength & Muscle Tone: within normal limits Neuro: no focal neurological deficits observed  Gait & Station: normal  Metabolic Disorder Labs: Lab Results  Component Value Date   HGBA1C 6.0 (H) 10/08/2023   MPG >398 10/27/2022   No results found for: "PROLACTIN" Lab Results  Component Value Date   CHOL 247 (H) 10/08/2023   TRIG 254 (H) 10/08/2023   HDL 59 10/08/2023   CHOLHDL 4.2 10/08/2023   LDLCALC 142 (H) 10/08/2023   Lab Results  Component Value Date   TSH 3.590 10/08/2023    Therapeutic Level Labs: No results found for: "LITHIUM" No results found for: "VALPROATE" No results found for: "CBMZ"  Screenings: PHQ2-9    Flowsheet Row  [facility] Visit from [facility]/20[facility] in [facility] Video Visit from 03[facility]13/2023 in [facility] Visit from 01[facility]16/2023 in [facility] Visit from 09[facility]12/2022 in [facility] Video Visit from 05[facility]30/2022 in [facility]  PHQ-2 Total Score 4 2 2 2 2   PHQ-9 Total Score 18 5 12 8 5       Flowsheet Row Office Visit from 06/09/2023 in [facility] Video Visit from 03/13/2023 in [facility] Video Visit from 01[facility]16/2023  in [facility]  C-SSRS RISK CATEGORY High Risk Error: Q3, 4, or 5 should not be populated when Q2 is No Error: Q3, 4, or 5 should not be populated when Q2 is No       Collaboration of Care: Collaboration of Care: Dr. Mercy Riding  Patient/Guardian was advised Release of Information must be obtained prior to any record release in order to collaborate their care with an outside provider. Patient/Guardian was advised if they have not already done so to contact the registration department to sign all necessary forms in order for [place] to release information regarding their care.   Consent: Patient/Guardian gives verbal consent for treatment and assignment of benefits for services provided during this visit. Patient/Guardian expressed understanding and agreed to proceed.   A total of 30 minutes was spent involved in face to face clinical care, chart review, documentation.   Lamar Sprinkles, MD 11/05/2023 1:58 PM

## 2023-11-06 MED ORDER — ARIPIPRAZOLE 10 MG PO TABS: 10.0000 mg | ORAL_TABLET | Freq: Every day | ORAL | 1 refills | Status: DC

## 2023-11-06 MED ORDER — PROPRANOLOL HCL 10 MG PO TABS: 10.0000 mg | ORAL_TABLET | Freq: Two times a day (BID) | ORAL | 1 refills | Status: DC

## 2023-11-09 ENCOUNTER — Encounter (HOSPITAL_COMMUNITY): Payer: Self-pay | Admitting: Student

## 2023-11-10 NOTE — Addendum Note (Signed)
Addended by: Everlena Cooper on: 11/10/2023 10:06 AM   Modules accepted: Level of Service

## 2023-11-13 ENCOUNTER — Ambulatory Visit: Payer: Commercial Managed Care - PPO | Admitting: Dietician

## 2023-12-01 ENCOUNTER — Ambulatory Visit (HOSPITAL_COMMUNITY): Payer: Commercial Managed Care - PPO | Admitting: Student

## 2023-12-22 ENCOUNTER — Ambulatory Visit (HOSPITAL_BASED_OUTPATIENT_CLINIC_OR_DEPARTMENT_OTHER): Payer: Commercial Managed Care - PPO | Admitting: Student

## 2023-12-22 ENCOUNTER — Encounter (HOSPITAL_COMMUNITY): Payer: Self-pay | Admitting: Student

## 2023-12-22 VITALS — BP 121/71 | HR 67 | Ht 67.0 in | Wt 188.0 lb

## 2023-12-22 DIAGNOSIS — Z79899 Other long term (current) drug therapy: Secondary | ICD-10-CM | POA: Diagnosis not present

## 2023-12-22 DIAGNOSIS — F411 Generalized anxiety disorder: Secondary | ICD-10-CM

## 2023-12-22 DIAGNOSIS — F250 Schizoaffective disorder, bipolar type: Secondary | ICD-10-CM

## 2023-12-22 DIAGNOSIS — G2401 Drug induced subacute dyskinesia: Secondary | ICD-10-CM

## 2023-12-22 DIAGNOSIS — T43505A Adverse effect of unspecified antipsychotics and neuroleptics, initial encounter: Secondary | ICD-10-CM

## 2023-12-22 NOTE — Progress Notes (Signed)
BH MD Outpatient Progress Note  12/22/2023 9:15 AM  Kelly Beasley  MRN:  [id number]  Assessment:  Kelly Beasley presents for follow-up evaluation in-person. Today, 12/22/2023 , patient reports she has been unable to afford her medications with other living expenses. Thus her sx remain unchanged from previous visit, as she has been without antipsychotic medication since 11/25: severe anxiety, increase in the volume of chronic auditory hallucinations, increase in her visual hallucinations, and worsening in akathisia.  This is apparent throughout the visit; she gets up to walk throughout the visit.  Plan remains to trial Abilify as well as propranolol for both akathisia and anxiety.  Patient is agreeable to this plan.  Patient with chronic SI, but without intent nor plan.  Patient poses no safety threats to herself nor others at this time.  Involuntary movements remain improved with Ingrezza.  Will make no changes to dosage at this time.   Identifying Information: Kelly Beasley is a 53 y.o. female with a history of schizoaffective disorder-bipolar type, GAD, and panic disorder who is an established patient with [facility] for management of depression, anxiety, and insomnia.   Risk Assessment: An assessment of suicide and violence risk factors was performed as part of this evaluation and is not significantly changed from the last visit.             While future psychiatric events cannot be accurately predicted, the patient does not  currently require acute inpatient psychiatric care and does not currently meet [facility] involuntary commitment criteria.          Plan:  # Schizoaffective Disorder, bipolar type Status of problem: Fairly managed Interventions: Previously discontinued olanzapine and loxapine due to adverse effects -- START Abilify 10 mg daily -- START propranolol 10 mg twice daily for akathisia and anxiety  # GAD Status of problem:  Increased Interventions: Patient discontinued Effexor 225 mg daily -- Continue Hydroxyzine 25-50 mg TID PRN -- START propranolol 10 mg twice daily for anxiety and akathisia  # Tardive Dyskinesia Past medication trials: Cogentin Status of problem: Stable Interventions: -- Continue Ingrezza 60 mg daily.  Return to care in 4-6 weeks  Patient was given contact information for behavioral health clinic and was instructed to call 911 for emergencies.    Patient and plan of care will be discussed with the Attending MD ,Dr. Mercy Riding, who agrees with the above statement and plan.   Subjective:  Chief Complaint:  Chief Complaint  Patient presents with   Follow-up   Schizophrenia    Interval History: Patient reports doing overall "about the same" today, as she has been unable to retrieve medications from her pharmacy. She had to make the difficult decision of choosing between medications and making her house payment for the month.   She has been noncompliant with all medications, not just psychotropics, because of this. She does report obtaining CBGs with glucose ranging in the 200s-300s without antihyperglycemics.   Her anxiety remains elevated, along with difficulty sitting still/urge to get up and walk, and AVH worsened (voices louder and seeing shadow people). Last Loxapine dose 11/25. She has still  been completely without antipsychotics since.  Sleeping poorly, average of 6 hours and tired.   Still reports being under a lot of stress, especially with ensuring her sons are okay. Working two jobs, 7 days weekly. She also has to have cataract surgery, with consultation appointment this Friday.   Denies active SI, passive SI is chronic. Kids are  protective. Denies HI.   She denies tobacco, alcohol, and illicit drug use.   Visit Diagnosis:    ICD-10-CM   1. Schizoaffective disorder, bipolar type (HCC)  F25.0     2. 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  GAD (generalized anxiety disorder)  F41.1     4. Long term current use of antipsychotic medication  Z79.899           Past Psychiatric History:  Diagnoses: Schizoaffective disorder-bipolar type  Medication trials: Seroquel, Prozac Previous psychiatrist/therapist: Dr. Michae Kava Hospitalizations: Denies Suicide attempts: denies SIB: Denies Hx of violence towards others: Denies Current access to guns: Denies Hx of trauma/abuse: Denies Substance use: Denies  06/09/23: Considered that in 10/2022, patient experienced some psychosis while delirious 2/2 hyperglycemia, but these sx have been persistent and occur when blood glucose is in a normal range, and also occur outside of mood sx. As well, patient is within the age range of the bimodal onset of schizophrenia in female patients. As psychiatric diagnoses can be dynamic, will continue to assess in subsequent visits to ensure correct diagnosis.  Patient has a hx of requiring maximal dosages of medications and developing tolerance. As she trialed Seroquel and now Olanzapine, believe it more appropriate to move to an FGA, particularly with schizophrenia spectrum dx. Considered Haldol, but will opt for an agent less likely to worsen TD sx, Loxapine. If patient has insufficient response to Loxapine, will instead consider Abilify, as it has a different receptor profile from the other two SGAs.  10/2023: Patient had adverse reaction to increase in Loxapine (dizziness, heaviness while walking), and discontinued medication. Opted to switch to Abilify during this visit.  Past Medical History:  Past Medical History:  Diagnosis Date   Anxiety    Bipolar disorder (HCC)    Bipolar I disorder, most recent episode depressed (HCC) 08/27/2018   Depression    No past surgical history on file.  Family Psychiatric History: Mom-chronic insomnia  Family History:  Family History  Problem Relation Age of Onset   Cancer Father     Social History:   Academic/Vocational:  Social History   Socioeconomic History   Marital status: Divorced    Spouse name: Not on file   Number of children: Not on file   Years of education: Not on file   Highest education level: Not on file  Occupational History   Not on file  Tobacco Use   Smoking status: Never   Smokeless tobacco: Never  Vaping Use   Vaping status: Never Used  Substance and Sexual Activity   Alcohol use: No   Drug use: No   Sexual activity: Never    Birth control/protection: Surgical  Other Topics Concern   Not on file  Social History Narrative   Not on file   Social Drivers of Health   Financial Resource Strain: Not on file  Food Insecurity: Not on file  Transportation Needs: Not on file  Physical Activity: Not on file  Stress: Not on file  Social Connections: Not on file    Allergies: No Known Allergies  Current Medications: Current Outpatient Medications  Medication Sig Dispense Refill   atorvastatin (LIPITOR) 40 MG tablet Take 1 tablet (40 mg total) by mouth daily. 60 tablet 2   metFORMIN (GLUCOPHAGE-XR) 500 MG 24 hr tablet Take 2 tablets (1,000 mg total) by mouth daily. 90 tablet 2   ARIPiprazole (ABILIFY) 10 MG tablet Take 1 tablet (10 mg total) by  mouth daily. (Patient not taking: Reported on 12/22/2023) 30 tablet 1   blood glucose meter kit and supplies KIT Dispense based on patient and insurance preference. Use up to four times daily as directed. 1 each 0   fluticasone (FLONASE) 50 MCG/ACT nasal spray Place 2 sprays into both nostrils daily. 16 mL 0   hydrOXYzine (VISTARIL) 25 MG capsule Take 1-2 tabs po qHS prn insomnia 180 capsule 0   INGREZZA 60 MG capsule Take 60 mg by mouth daily.     insulin glargine (LANTUS SOLOSTAR) 100 UNIT/ML Solostar Pen Inject 78 Units into the skin daily. 15 mL 11   propranolol (INDERAL) 10 MG tablet Take 1 tablet (10 mg total) by mouth 2 (two) times daily. 60 tablet 1   Semaglutide, 1 MG/DOSE, (OZEMPIC, 1 MG/DOSE,) 4 MG/3ML  SOPN Inject 1 mg into the skin once a week. (Patient not taking: Reported on 11/05/2023)     No current facility-administered medications for this visit.    ROS: Review of Systems Per HPI  Objective:  Psychiatric Specialty Exam: Blood pressure 121/71, pulse 67, height 5\' 7"  (1.702 m), weight 188 lb (85.3 kg).Body mass index is 29.44 kg/m.  General Appearance: Casual  Eye Contact:  Good  Speech:  Clear and Coherent and Normal Rate  Volume:  Normal  Mood:   "About the same"  Affect:  Congruent and anxious-appearing  Thought Content: Hallucinations: Auditory Visual; mostly logical. Chronic AVH  Suicidal Thoughts:  Yes.  without intent/plan, chronic passive SI  Homicidal Thoughts:  No  Thought Process:  Coherent  Orientation:  Full (Time, Place, and Person)    Memory: Immediate;   Good Recent;   Fair Remote;   Poor  Judgment:  Fair  Insight:  Fair  Concentration:  Concentration: Good and Attention Span: Good  Recall: not formally assessed   Fund of Knowledge: Fair  Language: Good  Psychomotor Activity:  TD  Akathisia:  No  AIMS (if indicated): not done, as patient without antipsychotic x 2 months  Assets:  Communication Skills Desire for Improvement Housing Leisure Time Social Support Vocational/Educational  ADL's:  Intact  Cognition: WNL  Sleep:  Fair   PE: General: well-appearing; no acute distress  Pulm: no increased work of breathing on room air  Strength & Muscle Tone: within normal limits Neuro: no focal neurological deficits observed  Gait & Station: normal  Metabolic Disorder Labs: Lab Results  Component Value Date   HGBA1C 6.0 (H) 10/08/2023   MPG >398 10/27/2022   No results found for: "PROLACTIN" Lab Results  Component Value Date   CHOL 247 (H) 10/08/2023   TRIG 254 (H) 10/08/2023   HDL 59 10/08/2023   CHOLHDL 4.2 10/08/2023   LDLCALC 142 (H) 10/08/2023   Lab Results  Component Value Date   TSH 3.590 10/08/2023    Therapeutic Level  Labs: No results found for: "LITHIUM" No results found for: "VALPROATE" No results found for: "CBMZ"  Screenings: PHQ2-9    Flowsheet Row Office Visit from [facility]/09/20[facility] in [facility] Video Visit from 03/13/2023 in [facility] Video Visit from 01[facility]16/2023 in [facility] Video Visit from 09[facility]12/2022 in [facility] Video Visit from 05/30/2022 in [facility]  PHQ-2 Total Score 4 2 2 2 2   PHQ-9 Total Score 18 5 12 8 5       Flowsheet Row Office Visit from 06/09/2023 in [facility] Video Visit from  03/13/2023 in [facility] Video Visit from 01/16/2023 in [facility]  C-SSRS RISK CATEGORY High Risk Error: Q3, 4, or 5 should not be populated when Q2 is No Error: Q3, 4, or 5 should not be populated when Q2 is No       Collaboration of Care: Collaboration of Care: Dr. Mercy Riding  Patient/Guardian was advised Release of Information must be obtained prior to any record release in order to collaborate their care with an outside provider. Patient/Guardian was advised if they have not already done so to contact the registration department to sign all necessary forms in order for [place] to release information regarding their care.   Consent: Patient/Guardian gives verbal consent for treatment and assignment of benefits for services provided during this visit. Patient/Guardian expressed understanding and agreed to proceed.    Lamar Sprinkles, MD 12/22/2023 9:15 AM

## 2023-12-22 NOTE — Addendum Note (Signed)
Addended by: Everlena Cooper on: 12/22/2023 04:36 PM   Modules accepted: Level of Service

## 2024-01-07 ENCOUNTER — Ambulatory Visit: Payer: Commercial Managed Care - PPO | Admitting: Internal Medicine

## 2024-01-07 ENCOUNTER — Encounter: Payer: Self-pay | Admitting: Internal Medicine

## 2024-01-07 VITALS — BP 130/82 | HR 77 | Temp 98.7°F | Resp 18 | Ht 67.0 in | Wt 183.0 lb

## 2024-01-07 DIAGNOSIS — E1165 Type 2 diabetes mellitus with hyperglycemia: Secondary | ICD-10-CM

## 2024-01-07 MED ORDER — METFORMIN HCL ER 500 MG PO TB24: 1000.0000 mg | ORAL_TABLET | Freq: Two times a day (BID) | ORAL | 3 refills | Status: AC

## 2024-01-07 MED ORDER — ATORVASTATIN CALCIUM 40 MG PO TABS: 40.0000 mg | ORAL_TABLET | Freq: Every day | ORAL | 2 refills | Status: DC

## 2024-01-07 MED ORDER — LANTUS SOLOSTAR 100 UNIT/ML ~~LOC~~ SOPN
78.0000 [IU] | PEN_INJECTOR | Freq: Every day | SUBCUTANEOUS | 3 refills | Status: DC
Start: 2024-01-07 — End: 2024-04-27

## 2024-01-07 MED ORDER — OZEMPIC (0.25 OR 0.5 MG/DOSE) 2 MG/3ML ~~LOC~~ SOPN: 0.5000 mg | PEN_INJECTOR | SUBCUTANEOUS | 0 refills | Status: DC

## 2024-01-07 MED ORDER — OZEMPIC (1 MG/DOSE) 4 MG/3ML ~~LOC~~ SOPN: 1.0000 mg | PEN_INJECTOR | SUBCUTANEOUS | 5 refills | Status: DC

## 2024-01-07 NOTE — Assessment & Plan Note (Signed)
I had a discussion with her regarding compliance.  Her A1c was doing well before but she ran out of her meds and never picked them up from the pharmacy.  I want her to do this and continue to check her FSBS at this time.  If she has any problems, she is to call our office.

## 2024-01-07 NOTE — Progress Notes (Addendum)
Office Visit  Subjective   Patient ID: Kelly Beasley   DOB: 20-May-1971   Age: 53 y.o.   MRN: [id number]   Chief Complaint Chief Complaint  Patient presents with   Follow-up     History of Present Illness Kelly Beasley is a 53 y.o. female who who returns today for followup of her T2 diabetes.  She was previously followed by [facility] in [place].  She was diagnosed with T2 diabetes in 2023 where she was started on medications at that time.  She has been seen in the ED multiple times in the last year with either hyperglycemia or hypoglycemia.  She is currently on metformin 1000mg  BID and Lantus 78 units subcut daily and ozempic 1mg  subcut weekly.  She tells me that she ran out of her meds about 2 months ago and never had the change to go pick them up.  She states that her lantus needs to be mail ordered.  She is also on novolog sliding scale which she only takes when her blood sugar are >200-300.  She states she uses this maybe once per week.  She has been in the ER at [facility] in the past  for hypoglycemia due to her not eating and her sugars going low.  She usually skips breakfast and will eat lunch and dinner or she may skip breakfast and lunch and eat at night.  She was seen in the ER several weeks at [facility] due to her FSBS >400.  They also told her to go get her medicines and be compliant.  She is not walking as much as they would like.. She specifically denies unexplained abdominal pain, nausea or vomiting or diarrhea.  She does check her blood sugars twice a day where she checks them before breakfast and lunch.  Her sugars are now runing 400.  Her last HgBA1c was done 3 months ago and was 6%. She has no long term complications of diabetic retinopathy, nephropathy, neuropathy or cardiovascular disease.  She has had some blurred vision and had an appointment to be seen by optometry but she states they never called her.     Past Medical History Past Medical History:  Diagnosis Date    Anxiety    Bipolar disorder (HCC)    Bipolar I disorder, most recent episode depressed (HCC) 08/27/2018   Depression      Allergies No Known Allergies   Medications  Current Outpatient Medications:    [START ON 03/08/2024] Semaglutide, 1 MG/DOSE, (OZEMPIC, 1 MG/DOSE,) 4 MG/3ML SOPN, Inject 1 mg into the skin once a week., Disp: 3 mL, Rfl: 5   [START ON 02/06/2024] Semaglutide,0.25 or 0.5MG /DOS, (OZEMPIC, 0.25 OR 0.5 MG/DOSE,) 2 MG/3ML SOPN, Inject 0.5 mg into the skin once a week., Disp: 3 mL, Rfl: 0   atorvastatin (LIPITOR) 40 MG tablet, Take 1 tablet (40 mg total) by mouth daily., Disp: 60 tablet, Rfl: 2   blood glucose meter kit and supplies KIT, Dispense based on patient and insurance preference. Use up to four times daily as directed., Disp: 1 each, Rfl: 0   fluticasone (FLONASE) 50 MCG/ACT nasal spray, Place 2 sprays into both nostrils daily., Disp: 16 mL, Rfl: 0   INGREZZA 60 MG capsule, Take 60 mg by mouth daily., Disp: , Rfl:    insulin glargine (LANTUS SOLOSTAR) 100 UNIT/ML Solostar Pen, Inject 78 Units into the skin daily., Disp: 70.2 mL, Rfl: 3   metFORMIN (GLUCOPHAGE-XR) 500 MG 24 hr tablet, Take 2 tablets (1,000  mg total) by mouth 2 (two) times daily with a meal., Disp: 360 tablet, Rfl: 3   propranolol (INDERAL) 10 MG tablet, Take 1 tablet (10 mg total) by mouth 2 (two) times daily., Disp: 60 tablet, Rfl: 1   Semaglutide, 1 MG/DOSE, (OZEMPIC, 1 MG/DOSE,) 4 MG/3ML SOPN, Inject 1 mg into the skin once a week. (Patient not taking: Reported on 11/05/2023), Disp: , Rfl:    Review of Systems Review of Systems  Constitutional:  Negative for chills and fever.  Eyes:  Positive for blurred vision.  Respiratory:  Negative for shortness of breath.   Cardiovascular:  Negative for chest pain, palpitations and leg swelling.  Gastrointestinal:  Negative for abdominal pain, constipation, diarrhea, nausea and vomiting.  Genitourinary:  Positive for frequency.  Musculoskeletal:  Negative  for myalgias.  Skin:  Negative for rash.  Neurological:  Negative for dizziness, weakness and headaches.  Endo/Heme/Allergies:  Positive for polydipsia.       Objective:    Vitals BP 130/82   Pulse 77   Temp 98.7 F (37.1 C)   Resp 18   Ht 5\' 7"  (1.702 m)   Wt 183 lb (83 kg)   SpO2 98%   BMI 28.66 kg/m    Physical Examination Physical Exam Constitutional:      Appearance: Normal appearance. She is not ill-appearing.  Cardiovascular:     Rate and Rhythm: Normal rate and regular rhythm.     Pulses: Normal pulses.     Heart sounds: No murmur heard.    No friction rub. No gallop.  Pulmonary:     Effort: Pulmonary effort is normal. No respiratory distress.     Breath sounds: No wheezing, rhonchi or rales.  Abdominal:     General: Bowel sounds are normal. There is no distension.     Palpations: Abdomen is soft.     Tenderness: There is no abdominal tenderness.  Musculoskeletal:     Right lower leg: No edema.     Left lower leg: No edema.  Skin:    General: Skin is warm and dry.     Findings: No rash.  Neurological:     Mental Status: She is alert.        Assessment & Plan:   Inadequately controlled diabetes mellitus (HCC) I had a discussion with her regarding compliance.  Her A1c was doing well before but she ran out of her meds and never picked them up from the pharmacy.  I want her to do this and continue to check her FSBS at this time.  If she has any problems, she is to call our office.    Return in about 3 months (around 04/05/2024).   Crist Fat, MD

## 2024-01-09 ENCOUNTER — Ambulatory Visit: Payer: Commercial Managed Care - PPO | Admitting: Internal Medicine

## 2024-01-26 ENCOUNTER — Ambulatory Visit (HOSPITAL_BASED_OUTPATIENT_CLINIC_OR_DEPARTMENT_OTHER): Payer: Commercial Managed Care - PPO | Admitting: Student

## 2024-01-26 ENCOUNTER — Encounter (HOSPITAL_COMMUNITY): Payer: Self-pay | Admitting: Student

## 2024-01-26 VITALS — BP 118/75 | HR 86 | Ht 67.0 in | Wt 181.6 lb

## 2024-01-26 DIAGNOSIS — F411 Generalized anxiety disorder: Secondary | ICD-10-CM | POA: Diagnosis not present

## 2024-01-26 DIAGNOSIS — F250 Schizoaffective disorder, bipolar type: Secondary | ICD-10-CM | POA: Diagnosis not present

## 2024-01-26 DIAGNOSIS — Z79899 Other long term (current) drug therapy: Secondary | ICD-10-CM

## 2024-01-26 DIAGNOSIS — G2401 Drug induced subacute dyskinesia: Secondary | ICD-10-CM

## 2024-01-26 DIAGNOSIS — T43505A Adverse effect of unspecified antipsychotics and neuroleptics, initial encounter: Secondary | ICD-10-CM

## 2024-01-26 NOTE — Progress Notes (Signed)
 BH MD Outpatient Progress Note  01/26/2024 1:37 PM  Kelly Beasley  MRN:  [id number]  Assessment:  Kelly Beasley presents for follow-up evaluation in-person. Today, 01/29/2024 , patient reports she has been able to afford her medications and has noted improvement since starting Abilify.  She denies adverse effects of the medication.  Since starting the medication, her auditory and visual hallucinations have become less.  As well, her anxiety is better managed.  This is also the case with propranolol.  Her akathisia has improved significantly, and she is able to sit still throughout the entire visit.  Patient does believe that the Abilify is on the right track, and we will increase her dosage today.  We will have a closer follow up as she has been somatic with dose increases in the past.   Additionally patient requests to increase her bedtime hydroxyzine to 50 mg to help for sleep, as 25 mg is no longer as effective.  This Clinical research associate is in agreement.  As we are making to medication changes today (Abilify and hydroxyzine increases), we will keep propranolol dosage as it is.  During the next visit, we will consider increasing the propranolol to further manage anxiety and to mitigate akathisia.  Patient with chronic SI, but without intent nor plan.  Patient poses no safety threats to herself nor others at this time.  Involuntary movements remain improved with Ingrezza.  Will make no changes to dosage at this time.   Identifying Information: Kelly Beasley is a 53 y.o. female with a history of schizoaffective disorder-bipolar type, GAD, and panic disorder who is an established patient with [facility] for management of depression, anxiety, and insomnia.   Risk Assessment: An assessment of suicide and violence risk factors was performed as part of this evaluation and is not significantly changed from the last visit.             While future psychiatric events cannot be accurately  predicted, the patient does not  currently require acute inpatient psychiatric care and does not currently meet [facility] involuntary commitment criteria.          Plan:  # Schizoaffective Disorder, bipolar type Status of problem: Fairly managed Interventions: Previously discontinued olanzapine and loxapine due to adverse effects -- Increase to Abilify 15 mg daily -- Continue propranolol 10 mg twice daily for akathisia and anxiety  # GAD Status of problem: Some improved Interventions: Patient discontinued Effexor 225 mg daily -- Increase to hydroxyzine 50 mg nightly as needed sleep -- Continue propranolol 10 mg twice daily for anxiety and akathisia; may consider increasing to 20 mg twice daily during next visit.  Wanted to avoid making too many changes today.  # Tardive Dyskinesia Past medication trials: Cogentin Status of problem: Stable Interventions: -- Continue Ingrezza 60 mg daily.  Return to care in 4-6 weeks  Patient was given contact information for behavioral health clinic and was instructed to call 911 for emergencies.    Patient and plan of care will be discussed with the Attending MD ,Dr. Mercy Riding, who agrees with the above statement and plan.   Subjective:  Chief Complaint:  Chief Complaint  Patient presents with   Follow-up   Medication Refill   Schizophrenia   Anxiety    Interval History: Patient reports she was able to afford her medications, which has led to some improvements.  She has been much less akathetic.  She is still anxious but somewhat less. Voices some quiter, and  shadows improved.   Her mood is some improved, and she reports tolerating the Abilify without adverse effects.  She is medication compliant with Abilify, propranolol, and Ingrezza.  She reports taking hydroxyzine at bedtime, but she is started to tolerate 25 mg and it is no longer as effective for her.  She requests a dose increase to help with sleep.  Taking Ingrezza which has  helped tremendously with involuntary perioral and forehead movements.  She does report obtaining CBGs with glucose ranging in the 200s-300s without antihyperglycemics.   Denies active SI, passive SI is chronic. Kids are protective. Denies HI.   She denies tobacco, alcohol, and illicit drug use.   Visit Diagnosis:    ICD-10-CM   1. Schizoaffective disorder, bipolar type (HCC)  F25.0 ARIPiprazole (ABILIFY) 15 MG tablet    2. 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 GAD (generalized anxiety disorder)  F41.1 propranolol (INDERAL) 10 MG tablet    hydrOXYzine (VISTARIL) 50 MG capsule    4. Long term current use of antipsychotic medication  Z79.899            Past Psychiatric History:  Diagnoses: Schizoaffective disorder-bipolar type  Medication trials: Seroquel, Prozac Previous psychiatrist/therapist: Dr. Michae Kava Hospitalizations: Denies Suicide attempts: denies SIB: Denies Hx of violence towards others: Denies Current access to guns: Denies Hx of trauma/abuse: Denies Substance use: Denies  06/09/23: Considered that in 10/2022, patient experienced some psychosis while delirious 2/2 hyperglycemia, but these sx have been persistent and occur when blood glucose is in a normal range, and also occur outside of mood sx. As well, patient is within the age range of the bimodal onset of schizophrenia in female patients. As psychiatric diagnoses can be dynamic, will continue to assess in subsequent visits to ensure correct diagnosis.  Patient has a hx of requiring maximal dosages of medications and developing tolerance. As she trialed Seroquel and now Olanzapine, believe it more appropriate to move to an FGA, particularly with schizophrenia spectrum dx. Considered Haldol, but will opt for an agent less likely to worsen TD sx, Loxapine. If patient has insufficient response to Loxapine, will instead consider Abilify, as it has a different receptor profile from the other  two SGAs.  10/2023: Patient had adverse reaction to increase in Loxapine (dizziness, heaviness while walking), and discontinued medication. Opted to switch to Abilify during this visit.  Past Medical History:  Past Medical History:  Diagnosis Date   Anxiety    Bipolar disorder (HCC)    Bipolar I disorder, most recent episode depressed (HCC) 08/27/2018   Depression    No past surgical history on file.  Family Psychiatric History: Mom-chronic insomnia  Family History:  Family History  Problem Relation Age of Onset   Cancer Father     Social History:  Academic/Vocational:  Social History   Socioeconomic History   Marital status: Divorced    Spouse name: Not on file   Number of children: Not on file   Years of education: Not on file   Highest education level: Not on file  Occupational History   Not on file  Tobacco Use   Smoking status: Never   Smokeless tobacco: Never  Vaping Use   Vaping status: Never Used  Substance and Sexual Activity   Alcohol use: No   Drug use: No   Sexual activity: Never    Birth control/protection: Surgical  Other Topics Concern   Not on file  Social History Narrative   Not  on file   Social Drivers of Health   Financial Resource Strain: Not on file  Food Insecurity: Not on file  Transportation Needs: Not on file  Physical Activity: Not on file  Stress: Not on file  Social Connections: Not on file    Allergies: No Known Allergies  Current Medications: Current Outpatient Medications  Medication Sig Dispense Refill   atorvastatin (LIPITOR) 40 MG tablet Take 1 tablet (40 mg total) by mouth daily. 60 tablet 2   blood glucose meter kit and supplies KIT Dispense based on patient and insurance preference. Use up to four times daily as directed. 1 each 0   fluticasone (FLONASE) 50 MCG/ACT nasal spray Place 2 sprays into both nostrils daily. 16 mL 0   INGREZZA 60 MG capsule Take 60 mg by mouth daily.     insulin glargine (LANTUS SOLOSTAR)  100 UNIT/ML Solostar Pen Inject 78 Units into the skin daily. 70.2 mL 3   metFORMIN (GLUCOPHAGE-XR) 500 MG 24 hr tablet Take 2 tablets (1,000 mg total) by mouth 2 (two) times daily with a meal. 360 tablet 3   [START ON 03/08/2024] Semaglutide, 1 MG/DOSE, (OZEMPIC, 1 MG/DOSE,) 4 MG/3ML SOPN Inject 1 mg into the skin once a week. 3 mL 5   ARIPiprazole (ABILIFY) 15 MG tablet Take 1 tablet (15 mg total) by mouth daily. 30 tablet 1   hydrOXYzine (VISTARIL) 50 MG capsule Take 1 capsule (50 mg total) by mouth at bedtime as needed (insomnia). 30 capsule 1   propranolol (INDERAL) 10 MG tablet Take 1 tablet (10 mg total) by mouth 2 (two) times daily. 60 tablet 1   Semaglutide, 1 MG/DOSE, (OZEMPIC, 1 MG/DOSE,) 4 MG/3ML SOPN Inject 1 mg into the skin once a week. (Patient not taking: Reported on 11/05/2023)     [START ON 02/06/2024] Semaglutide,0.25 or 0.5MG /DOS, (OZEMPIC, 0.25 OR 0.5 MG/DOSE,) 2 MG/3ML SOPN Inject 0.5 mg into the skin once a week. 3 mL 0   No current facility-administered medications for this visit.    ROS: Review of Systems Per HPI  Objective:  Psychiatric Specialty Exam: Blood pressure 118/75, pulse 86, height 5\' 7"  (1.702 m), weight 181 lb 9.6 oz (82.4 kg).Body mass index is 28.44 kg/m.  General Appearance: Casual  Eye Contact:  Good  Speech:  Clear and Coherent and Normal Rate  Volume:  Normal  Mood:   "Somewhat better"  Affect:  Appropriate and Congruent  Thought Content: Hallucinations: Auditory Visual; mostly logical. Chronic AVH  Suicidal Thoughts:  Yes.  without intent/plan, chronic passive SI  Homicidal Thoughts:  No  Thought Process:  Coherent  Orientation:  Full (Time, Place, and Person)    Memory: Immediate;   Good Recent;   Fair Remote;   Poor  Judgment:  Fair  Insight:  Fair  Concentration:  Concentration: Good and Attention Span: Good  Recall: not formally assessed   Fund of Knowledge: Fair  Language: Good  Psychomotor Activity:  TD; no forehead,  perioral, nor truncal movements noted today  Akathisia:  No  AIMS (if indicated): No abnormal movements noted; AIMS equals 0  Assets:  Communication Skills Desire for Improvement Housing Leisure Time Social Support Vocational/Educational  ADL's:  Intact  Cognition: WNL  Sleep:  Fair, improved   PE: General: well-appearing; no acute distress  Pulm: no increased work of breathing on room air  Strength & Muscle Tone: within normal limits Neuro: no focal neurological deficits observed  Gait & Station: normal  Metabolic Disorder Labs: Lab Results  Component Value Date   HGBA1C 6.0 (H) 10/08/2023   MPG >398 10/27/2022   No results found for: "PROLACTIN" Lab Results  Component Value Date   CHOL 247 (H) 10/08/2023   TRIG 254 (H) 10/08/2023   HDL 59 10/08/2023   CHOLHDL 4.2 10/08/2023   LDLCALC 142 (H) 10/08/2023   Lab Results  Component Value Date   TSH 3.590 10/08/2023    Therapeutic Level Labs: No results found for: "LITHIUM" No results found for: "VALPROATE" No results found for: "CBMZ"  Screenings: PHQ2-9    Flowsheet Row Office Visit from [facility]/09/20[facility] in [facility] Video Visit from 03/13/2023 in [facility] Video Visit from 01[facility]16/2023 in [facility] Video Visit from 09/12/2022 in [facility] Video Visit from 05[facility]30/2022 in [facility]  PHQ-2 Total Score 4 2 2 2 2   PHQ-9 Total Score 18 5 12 8 5       Flowsheet Row Office Visit from 06/09/2023 in [facility] Video Visit from 03/13/2023 in [facility] Video Visit from 01/16/2023 in [facility]  C-SSRS RISK CATEGORY High Risk Error: Q3, 4, or 5 should not be populated when Q2 is No Error: Q3, 4, or 5 should not be  populated when Q2 is No       Collaboration of Care: Collaboration of Care: Dr. Mercy Riding  Patient/Guardian was advised Release of Information must be obtained prior to any record release in order to collaborate their care with an outside provider. Patient/Guardian was advised if they have not already done so to contact the registration department to sign all necessary forms in order for [place] to release information regarding their care.   Consent: Patient/Guardian gives verbal consent for treatment and assignment of benefits for services provided during this visit. Patient/Guardian expressed understanding and agreed to proceed.    Lamar Sprinkles, MD 01/26/2024 1:37 PM

## 2024-01-29 MED ORDER — ARIPIPRAZOLE 15 MG PO TABS: 15.0000 mg | ORAL_TABLET | Freq: Every day | ORAL | 1 refills | Status: DC

## 2024-01-29 MED ORDER — PROPRANOLOL HCL 10 MG PO TABS: 10.0000 mg | ORAL_TABLET | Freq: Two times a day (BID) | ORAL | 1 refills | Status: DC

## 2024-01-29 MED ORDER — HYDROXYZINE PAMOATE 50 MG PO CAPS: 50.0000 mg | ORAL_CAPSULE | Freq: Every evening | ORAL | 1 refills | Status: AC | PRN

## 2024-01-30 NOTE — Addendum Note (Signed)
 Addended by: Everlena Cooper on: 01/30/2024 12:14 PM   Modules accepted: Level of Service

## 2024-02-03 ENCOUNTER — Other Ambulatory Visit: Payer: Self-pay

## 2024-02-03 MED ORDER — ONETOUCH ULTRA VI STRP: 1.0000 | ORAL_STRIP | 2 refills | Status: DC | PRN

## 2024-02-03 MED ORDER — BLOOD GLUCOSE MONITOR KIT: PACK | 0 refills | Status: DC

## 2024-02-03 NOTE — Progress Notes (Signed)
 Rx refill

## 2024-02-03 NOTE — Progress Notes (Signed)
 Outside med

## 2024-02-17 ENCOUNTER — Other Ambulatory Visit: Payer: Self-pay

## 2024-02-17 DIAGNOSIS — E1165 Type 2 diabetes mellitus with hyperglycemia: Secondary | ICD-10-CM

## 2024-02-17 MED ORDER — FREESTYLE LIBRE 3 SENSOR MISC: 78.0000 [IU] | Freq: Every day | 3 refills | Status: DC

## 2024-02-17 MED ORDER — FREESTYLE INSULINX TEST VI STRP: ORAL_STRIP | 12 refills | Status: DC

## 2024-02-17 MED ORDER — FREESTYLE LANCETS MISC: 12 refills | Status: AC

## 2024-02-17 NOTE — Progress Notes (Signed)
 Freestyle Libre insulin kit/ RX

## 2024-02-17 NOTE — Progress Notes (Signed)
 Patient wants Free Style [place] sent in. "Works with her phone"

## 2024-02-17 NOTE — Progress Notes (Signed)
 Pt wants [employer]

## 2024-02-21 ENCOUNTER — Other Ambulatory Visit (HOSPITAL_COMMUNITY): Payer: Self-pay | Admitting: Student

## 2024-02-21 DIAGNOSIS — F411 Generalized anxiety disorder: Secondary | ICD-10-CM

## 2024-02-23 ENCOUNTER — Encounter (HOSPITAL_COMMUNITY): Payer: Self-pay | Admitting: Student

## 2024-02-23 ENCOUNTER — Telehealth (HOSPITAL_BASED_OUTPATIENT_CLINIC_OR_DEPARTMENT_OTHER): Admitting: Student

## 2024-02-23 DIAGNOSIS — F250 Schizoaffective disorder, bipolar type: Secondary | ICD-10-CM | POA: Diagnosis not present

## 2024-02-23 DIAGNOSIS — T43505A Adverse effect of unspecified antipsychotics and neuroleptics, initial encounter: Secondary | ICD-10-CM | POA: Diagnosis not present

## 2024-02-23 DIAGNOSIS — F411 Generalized anxiety disorder: Secondary | ICD-10-CM

## 2024-02-23 DIAGNOSIS — G2401 Drug induced subacute dyskinesia: Secondary | ICD-10-CM

## 2024-02-23 MED ORDER — PROPRANOLOL HCL 20 MG PO TABS: 20.0000 mg | ORAL_TABLET | Freq: Two times a day (BID) | ORAL | 1 refills | Status: DC

## 2024-02-23 MED ORDER — INGREZZA 60 MG PO CAPS: 60.0000 mg | ORAL_CAPSULE | Freq: Every day | ORAL | 1 refills | Status: DC

## 2024-02-23 NOTE — Progress Notes (Signed)
 Televisit via video: I connected with patient on 02/23/24 at  3:00 PM EDT by a video enabled telemedicine application and verified that I am speaking with the correct person using two identifiers.  Location: Patient: Work, in private area Provider: Office   I discussed the limitations of evaluation and management by telemedicine and the availability of in person appointments. The patient expressed understanding and agreed to proceed.  I discussed the assessment and treatment plan with the patient. The patient was provided an opportunity to ask questions and all were answered. The patient agreed with the plan and demonstrated an understanding of the instructions.   The patient was advised to call back or seek an in-person evaluation if the symptoms worsen or if the condition fails to improve as anticipated.  I spent 15 minutes in direct patient care.   BH MD Outpatient Progress Note  02/23/2024 3:23 PM   Kelly Beasley  MRN:  [id number]  Assessment:  Kelly Beasley presents for follow-up evaluation in-person. Today, 02/23/2024 , patient reports she has noted further improvements with titration of Abilify.  She denies adverse effects of the medication.  Since increasing the medication, her auditory and visual hallucinations have become less in frequency and volume.  As well, her anxiety is some improved, but still causing him distress.  Her akathisia has improved significantly, and she is able to sit still throughout the entire visit.  Patient does believe that the Abilify dosage is working well for her, and we will not make those changes today.  We will increase her propranolol dosage to address anxiety.  Additionally patient has responded well to the increase in her bedtime hydroxyzine.  Her sleep has improved with this adjustment.  Patient with chronic SI, but without intent nor plan.  This is also mildly less since last visit.  Patient poses no safety threats to herself nor others at this  time.  Involuntary movements remain improved with Ingrezza.  Will make no changes to dosage at this time.   Identifying Information: Kelly Beasley is a 53 y.o. female with a history of schizoaffective disorder-bipolar type, GAD, and panic disorder who is an established patient with [facility] for management of depression, anxiety, and insomnia.   Risk Assessment: An assessment of suicide and violence risk factors was performed as part of this evaluation and is not significantly changed from the last visit.             While future psychiatric events cannot be accurately predicted, the patient does not  currently require acute inpatient psychiatric care and does not currently meet [facility] involuntary commitment criteria.          Plan:  # Schizoaffective Disorder, bipolar type Status of problem: Fairly managed Interventions: Previously discontinued olanzapine and loxapine due to adverse effects -- Continue Abilify 15 mg daily -- Increase to propranolol 20 mg twice daily for akathisia and anxiety  # GAD Status of problem: Some improved Interventions: Patient discontinued Effexor 225 mg daily -- Continue hydroxyzine 50 mg nightly as needed sleep -- Increase to propranolol 20 mg twice daily for anxiety and akathisia  # Tardive Dyskinesia Past medication trials: Cogentin Status of problem: Stable Interventions: -- Continue Ingrezza 60 mg daily.  Return to care in 6-8 weeks  Patient was given contact information for behavioral health clinic and was instructed to call 911 for emergencies.    Patient and plan of care will be discussed with the Attending MD ,Dr. Mercy Riding, who  agrees with the above statement and plan.   Subjective:  Chief Complaint:  Chief Complaint  Patient presents with   Follow-up   Medication Refill   Schizophrenia   Anxiety    Interval History: Patient reports further improvements with the increase in Abilify. She has been  much less akathetic overall, but there is still some presents.  She is still anxious but somewhat less. Voices are quieter, and shadows improved.   Her mood is overall improved, and she continues to tolerate the Abilify without adverse effects.  She is medication compliant with Abilify, propranolol, hydroxyzine, and Ingrezza.  The increase in hydroxyzine at bedtime proved beneficial for her sleep, and she is now getting a sufficient amount of sleep and feeling more well rested the next day.  Taking Ingrezza which has helped tremendously with involuntary perioral and forehead movements.  She now has the [place] sensor, which has been helpful in controlling her blood glucose. She does note that she has had some hypoglycemia, which she resolved by eating a piece of candy.  Denies active SI, passive SI is chronic, but some less in frequency. Kids are protective. Denies HI.   She denies tobacco, alcohol, and illicit drug use.   Visit Diagnosis:    ICD-10-CM   1. Schizoaffective disorder, bipolar type (HCC)  F25.0     2. GAD (generalized anxiety disorder)  F41.1     3. Neuroleptic-induced tardive dyskinesia  G24.01    T43.505A         Past Psychiatric History:  Diagnoses: Schizoaffective disorder-bipolar type  Medication trials: Seroquel, Prozac Previous psychiatrist/therapist: Dr. Michae Kava Hospitalizations: Denies Suicide attempts: denies SIB: Denies Hx of violence towards others: Denies Current access to guns: Denies Hx of trauma/abuse: Denies Substance use: Denies  06/09/23: Considered that in 10/2022, patient experienced some psychosis while delirious 2/2 hyperglycemia, but these sx have been persistent and occur when blood glucose is in a normal range, and also occur outside of mood sx. As well, patient is within the age range of the bimodal onset of schizophrenia in female patients. As psychiatric diagnoses can be dynamic, will continue to assess in subsequent visits to ensure  correct diagnosis.  Patient has a hx of requiring maximal dosages of medications and developing tolerance. As she trialed Seroquel and now Olanzapine, believe it more appropriate to move to an FGA, particularly with schizophrenia spectrum dx. Considered Haldol, but will opt for an agent less likely to worsen TD sx, Loxapine. If patient has insufficient response to Loxapine, will instead consider Abilify, as it has a different receptor profile from the other two SGAs.  10/2023: Patient had adverse reaction to increase in Loxapine (dizziness, heaviness while walking), and discontinued medication. Opted to switch to Abilify during this visit.  Past Medical History:  Past Medical History:  Diagnosis Date   Anxiety    Bipolar disorder (HCC)    Bipolar I disorder, most recent episode depressed (HCC) 08/27/2018   Depression    No past surgical history on file.  Family Psychiatric History: Mom-chronic insomnia  Family History:  Family History  Problem Relation Age of Onset   Cancer Father     Social History:  Academic/Vocational:  Social History   Socioeconomic History   Marital status: Divorced    Spouse name: Not on file   Number of children: Not on file   Years of education: Not on file   Highest education level: Not on file  Occupational History   Not on file  Tobacco Use  Smoking status: Never   Smokeless tobacco: Never  Vaping Use   Vaping status: Never Used  Substance and Sexual Activity   Alcohol use: No   Drug use: No   Sexual activity: Never    Birth control/protection: Surgical  Other Topics Concern   Not on file  Social History Narrative   Not on file   Social Drivers of Health   Financial Resource Strain: Not on file  Food Insecurity: Not on file  Transportation Needs: Not on file  Physical Activity: Not on file  Stress: Not on file  Social Connections: Not on file    Allergies: No Known Allergies  Current Medications: Current Outpatient  Medications  Medication Sig Dispense Refill   ARIPiprazole (ABILIFY) 15 MG tablet Take 1 tablet (15 mg total) by mouth daily. 30 tablet 1   atorvastatin (LIPITOR) 40 MG tablet Take 1 tablet (40 mg total) by mouth daily. 60 tablet 2   Continuous Glucose Sensor (FREESTYLE LIBRE 3 SENSOR) MISC 78 Units by Does not apply route daily. Place 1 sensor on the skin every 14 days. Use to check glucose continuously 2 each 3   fluticasone (FLONASE) 50 MCG/ACT nasal spray Place 2 sprays into both nostrils daily. 16 mL 0   glucose blood (FREESTYLE INSULINX TEST) test strip Use as instructed 100 each 12   hydrOXYzine (VISTARIL) 50 MG capsule Take 1 capsule (50 mg total) by mouth at bedtime as needed (insomnia). 30 capsule 1   INGREZZA 60 MG capsule Take 60 mg by mouth daily.     insulin glargine (LANTUS SOLOSTAR) 100 UNIT/ML Solostar Pen Inject 78 Units into the skin daily. 70.2 mL 3   Lancets (FREESTYLE) lancets Use as instructed 100 each 12   metFORMIN (GLUCOPHAGE-XR) 500 MG 24 hr tablet Take 2 tablets (1,000 mg total) by mouth 2 (two) times daily with a meal. 360 tablet 3   propranolol (INDERAL) 10 MG tablet Take 1 tablet (10 mg total) by mouth 2 (two) times daily. 60 tablet 1   Semaglutide, 1 MG/DOSE, (OZEMPIC, 1 MG/DOSE,) 4 MG/3ML SOPN Inject 1 mg into the skin once a week. (Patient not taking: Reported on 11/05/2023)     [START ON 03/08/2024] Semaglutide, 1 MG/DOSE, (OZEMPIC, 1 MG/DOSE,) 4 MG/3ML SOPN Inject 1 mg into the skin once a week. 3 mL 5   Semaglutide,0.25 or 0.5MG /DOS, (OZEMPIC, 0.25 OR 0.5 MG/DOSE,) 2 MG/3ML SOPN Inject 0.5 mg into the skin once a week. 3 mL 0   No current facility-administered medications for this visit.    ROS: Review of Systems Per HPI  Objective:  Psychiatric Specialty Exam: There were no vitals taken for this visit.There is no height or weight on file to calculate BMI.  General Appearance: Well Groomed  Eye Contact:  Good  Speech:  Clear and Coherent and Normal  Rate  Volume:  Normal  Mood:   "Much better"  Affect:  Appropriate and Congruent  Thought Content: Hallucinations: Auditory Visual; mostly logical. Chronic AVH, but less  Suicidal Thoughts:  Yes.  without intent/plan, chronic passive SI  Homicidal Thoughts:  No  Thought Process:  Coherent  Orientation:  Full (Time, Place, and Person)    Memory: Immediate;   Good Recent;   Fair Remote;   Poor  Judgment:  Fair  Insight:  Fair  Concentration:  Concentration: Good and Attention Span: Good  Recall: not formally assessed   Fund of Knowledge: Fair  Language: Good  Psychomotor Activity:  TD; no forehead, perioral, nor  truncal movements noted today  Akathisia:  No  AIMS (if indicated): No abnormal movements noted; AIMS equals 0  Assets:  Communication Skills Desire for Improvement Housing Leisure Time Social Support Vocational/Educational  ADL's:  Intact  Cognition: WNL  Sleep:  Fair, improved   PE: General: well-appearing; no acute distress  Pulm: no increased work of breathing on room air  Strength & Muscle Tone: within normal limits Neuro: no focal neurological deficits observed  Gait & Station: normal  Metabolic Disorder Labs: Lab Results  Component Value Date   HGBA1C 6.0 (H) 10/08/2023   MPG >398 10/27/2022   No results found for: "PROLACTIN" Lab Results  Component Value Date   CHOL 247 (H) 10/08/2023   TRIG 254 (H) 10/08/2023   HDL 59 10/08/2023   CHOLHDL 4.2 10/08/2023   LDLCALC 142 (H) 10/08/2023   Lab Results  Component Value Date   TSH 3.590 10/08/2023    Therapeutic Level Labs: No results found for: "LITHIUM" No results found for: "VALPROATE" No results found for: "CBMZ"  Screenings: PHQ2-9    Flowsheet Row Office Visit from [facility]/20[facility] in [facility] Video Visit from 03/13/2023 in [facility] Video Visit from 01[facility]16/2023 in [facility] Video Visit from 09/12/2022 in [facility] Video Visit from 05[facility]30/2022 in [facility]  PHQ-2 Total Score 4 2 2 2 2   PHQ-9 Total Score 18 5 12 8 5       Flowsheet Row Office Visit from 06/09/2023 in [facility] Video Visit from 03[facility]13/2023 in [facility] Video Visit from 01/16/2023 in [facility]  C-SSRS RISK CATEGORY High Risk Error: Q3, 4, or 5 should not be populated when Q2 is No Error: Q3, 4, or 5 should not be populated when Q2 is No       Collaboration of Care: Collaboration of Care: Dr. Mercy Riding  Patient/Guardian was advised Release of Information must be obtained prior to any record release in order to collaborate their care with an outside provider. Patient/Guardian was advised if they have not already done so to contact the registration department to sign all necessary forms in order for [place] to release information regarding their care.   Consent: Patient/Guardian gives verbal consent for treatment and assignment of benefits for services provided during this visit. Patient/Guardian expressed understanding and agreed to proceed.    Lamar Sprinkles, MD 02/23/2024 3:23 PM

## 2024-02-25 ENCOUNTER — Other Ambulatory Visit (HOSPITAL_COMMUNITY): Payer: Self-pay | Admitting: Student

## 2024-02-25 DIAGNOSIS — F250 Schizoaffective disorder, bipolar type: Secondary | ICD-10-CM

## 2024-02-25 DIAGNOSIS — F411 Generalized anxiety disorder: Secondary | ICD-10-CM

## 2024-02-25 NOTE — Addendum Note (Signed)
 Addended by: Everlena Cooper on: 02/25/2024 10:51 AM   Modules accepted: Level of Service

## 2024-03-24 ENCOUNTER — Other Ambulatory Visit (HOSPITAL_COMMUNITY): Payer: Self-pay | Admitting: Student

## 2024-03-24 DIAGNOSIS — F411 Generalized anxiety disorder: Secondary | ICD-10-CM

## 2024-04-06 ENCOUNTER — Ambulatory Visit: Payer: Commercial Managed Care - PPO | Admitting: Internal Medicine

## 2024-04-07 ENCOUNTER — Telehealth (HOSPITAL_COMMUNITY): Admitting: Student

## 2024-04-12 ENCOUNTER — Ambulatory Visit: Admitting: Internal Medicine

## 2024-04-14 ENCOUNTER — Telehealth (HOSPITAL_COMMUNITY): Admitting: Student

## 2024-04-14 DIAGNOSIS — F411 Generalized anxiety disorder: Secondary | ICD-10-CM | POA: Diagnosis not present

## 2024-04-14 DIAGNOSIS — F250 Schizoaffective disorder, bipolar type: Secondary | ICD-10-CM

## 2024-04-14 NOTE — Progress Notes (Unsigned)
 Televisit via video: I connected with patient on 04/14/24 at  3:30 PM EDT by a video enabled telemedicine application and verified that I am speaking with the correct person using two identifiers.  Location: Patient: Work, in private area Provider: Office   I discussed the limitations of evaluation and management by telemedicine and the availability of in person appointments. The patient expressed understanding and agreed to proceed.  I discussed the assessment and treatment plan with the patient. The patient was provided an opportunity to ask questions and all were answered. The patient agreed with the plan and demonstrated an understanding of the instructions.   The patient was advised to call back or seek an in-person evaluation if the symptoms worsen or if the condition fails to improve as anticipated.  I spent 15 minutes in direct patient care.   BH MD Outpatient Progress Note  04/14/2024 3:40 PM   Kelly Beasley  MRN:  [id number]  Assessment:  Kelly Beasley presents for follow-up evaluation virtually. Today, 04/19/2024 , patient reports she has noted further improvements with titration of Abilify .  She denies adverse effects of the medication.  Since increasing the medication, her auditory and visual hallucinations have become less in frequency but volume is again increased.  As well, with increase in Propranolol , her anxiety and akathisia are further improved, no longer causing her distress. Patient requests an increase in Abilify . We discuss her previous sensitivity to dose increases and sx of which to be mindful. She voices understanding and is agreeable to the increase in Abilify . We will otherwise make no further adjustments.   Patient continues to respond well to the increase in her bedtime hydroxyzine .  Her sleep is improved with this adjustment.  Patient reports some less chronic SI, also without intent nor plan. Patient poses no safety threats to herself nor others at this  time.  Involuntary movements remain improved with Ingrezza .  Will make no changes to dosage at this time.   Identifying Information: Kelly Beasley is a 53 y.o. female with a history of schizoaffective disorder-bipolar type, GAD, and panic disorder who is an established patient with [facility] for management of depression, anxiety, and insomnia.   Risk Assessment: An assessment of suicide and violence risk factors was performed as part of this evaluation and is not significantly changed from the last visit.             While future psychiatric events cannot be accurately predicted, the patient does not  currently require acute inpatient psychiatric care and does not currently meet [place]  involuntary commitment criteria.          Plan:  # Schizoaffective Disorder, bipolar type Status of problem: Fairly managed Interventions: Previously discontinued olanzapine  and loxapine  due to adverse effects -- Increase to Abilify  20 mg daily -- Continue propranolol  20 mg twice daily for akathisia and anxiety  # GAD Status of problem: Some improved Interventions: Patient discontinued Effexor  225 mg daily -- Continue hydroxyzine  50 mg nightly as needed sleep -- Continue propranolol  20 mg twice daily for anxiety and akathisia  # Tardive Dyskinesia Past medication trials: Cogentin  Status of problem: Stable Interventions: -- Continue Ingrezza  60 mg daily.  Return to care in approx. 4 weeks  Patient was given contact information for behavioral health clinic and was instructed to call 911 for emergencies.    Patient and plan of care will be discussed with the Attending MD ,Dr. Sharalyn Dasen, who agrees with the above statement and plan.  Subjective:  Chief Complaint:  Chief Complaint  Patient presents with   Follow-up   Schizophrenia    Interval History: Patient reports further improvements with the increase in Abilify . Her mood is good, sleep is better, and  anxiety is improved. Sleeping an average of 6 hours per night.    She still much less akathetic overall, but there is still some present.  She is still anxious but somewhat less. Voices are louder, but less frequently, and shadows still present. More frequently that she sees shadows in periphery, but still there.   Takes hydroxyzine  nightly. Compliant with medications. TD movements well controlled.    Her mood is overall improved, and she continues to tolerate the Abilify  without adverse effects.  She is medication compliant with Abilify , propranolol , hydroxyzine , and Ingrezza .  The increase in hydroxyzine  at bedtime proved beneficial for her sleep, and she is now getting a sufficient amount of sleep and feeling more well rested the next day.  Taking Ingrezza  which has helped tremendously with involuntary perioral and forehead movements. Denies UE stiffness. Voiding appropriately.   She now has the [place] sensor, which has been helpful in controlling her blood glucose. She does note that she has had some hypoglycemia, which she resolved by eating a piece of candy. She is trying to manage her diet appropriately.   Denies active SI, passive SI is chronic, but some less in frequency. Kids are protective. Denies HI.   She denies tobacco, alcohol, and illicit drug use.   Visit Diagnosis:    ICD-10-CM   1. GAD (generalized anxiety disorder)  F41.1 propranolol  (INDERAL ) 20 MG tablet    2. Schizoaffective disorder, bipolar type (HCC)  F25.0 ARIPiprazole  (ABILIFY ) 20 MG tablet         Past Psychiatric History:  Diagnoses: Schizoaffective disorder-bipolar type  Medication trials: Seroquel , Prozac  Previous psychiatrist/therapist: Dr. Katrine Parody Hospitalizations: Denies Suicide attempts: denies SIB: Denies Hx of violence towards others: Denies Current access to guns: Denies Hx of trauma/abuse: Denies Substance use: Denies  06/09/23: Considered that in 10/2022, patient experienced some  psychosis while delirious 2/2 hyperglycemia, but these sx have been persistent and occur when blood glucose is in a normal range, and also occur outside of mood sx. As well, patient is within the age range of the bimodal onset of schizophrenia in female patients. As psychiatric diagnoses can be dynamic, will continue to assess in subsequent visits to ensure correct diagnosis.  Patient has a hx of requiring maximal dosages of medications and developing tolerance. As she trialed Seroquel  and now Olanzapine , believe it more appropriate to move to an FGA, particularly with schizophrenia spectrum dx. Considered Haldol, but will opt for an agent less likely to worsen TD sx, Loxapine . If patient has insufficient response to Loxapine , will instead consider Abilify , as it has a different receptor profile from the other two SGAs.  10/2023: Patient had adverse reaction to increase in Loxapine  (dizziness, heaviness while walking), and discontinued medication. Opted to switch to Abilify  during this visit.  Past Medical History:  Past Medical History:  Diagnosis Date   Anxiety    Bipolar disorder (HCC)    Bipolar I disorder, most recent episode depressed (HCC) 08/27/2018   Depression    No past surgical history on file.  Family Psychiatric History: Mom-chronic insomnia  Family History:  Family History  Problem Relation Age of Onset   Cancer Father     Social History:  Academic/Vocational:  Social History   Socioeconomic History   Marital status: Divorced  Spouse name: Not on file   Number of children: Not on file   Years of education: Not on file   Highest education level: Not on file  Occupational History   Not on file  Tobacco Use   Smoking status: Never   Smokeless tobacco: Never  Vaping Use   Vaping status: Never Used  Substance and Sexual Activity   Alcohol use: No   Drug use: No   Sexual activity: Never    Birth control/protection: Surgical  Other Topics Concern   Not on file   Social History Narrative   Not on file   Social Drivers of Health   Financial Resource Strain: Not on file  Food Insecurity: Not on file  Transportation Needs: Not on file  Physical Activity: Not on file  Stress: Not on file  Social Connections: Not on file    Allergies: No Known Allergies  Current Medications: Current Outpatient Medications  Medication Sig Dispense Refill   ARIPiprazole  (ABILIFY ) 20 MG tablet Take 1 tablet (20 mg total) by mouth daily. 30 tablet 1   atorvastatin  (LIPITOR) 40 MG tablet Take 1 tablet (40 mg total) by mouth daily. 60 tablet 2   Continuous Glucose Sensor (FREESTYLE LIBRE 3 SENSOR) MISC 78 Units by Does not apply route daily. Place 1 sensor on the skin every 14 days. Use to check glucose continuously 2 each 3   fluticasone  (FLONASE ) 50 MCG/ACT nasal spray Place 2 sprays into both nostrils daily. 16 mL 0   glucose blood (FREESTYLE INSULINX TEST) test strip Use as instructed 100 each 12   INGREZZA  60 MG capsule Take 1 capsule (60 mg total) by mouth daily. 30 capsule 1   insulin  glargine (LANTUS  SOLOSTAR) 100 UNIT/ML Solostar Pen Inject 78 Units into the skin daily. 70.2 mL 3   Lancets (FREESTYLE) lancets Use as instructed 100 each 12   metFORMIN  (GLUCOPHAGE -XR) 500 MG 24 hr tablet Take 2 tablets (1,000 mg total) by mouth 2 (two) times daily with a meal. 360 tablet 3   propranolol  (INDERAL ) 20 MG tablet Take 1 tablet (20 mg total) by mouth 2 (two) times daily. 60 tablet 1   Semaglutide , 1 MG/DOSE, (OZEMPIC , 1 MG/DOSE,) 4 MG/3ML SOPN Inject 1 mg into the skin once a week. (Patient not taking: Reported on 11/05/2023)     Semaglutide , 1 MG/DOSE, (OZEMPIC , 1 MG/DOSE,) 4 MG/3ML SOPN Inject 1 mg into the skin once a week. 3 mL 5   Semaglutide ,0.25 or 0.5MG /DOS, (OZEMPIC , 0.25 OR 0.5 MG/DOSE,) 2 MG/3ML SOPN Inject 0.5 mg into the skin once a week. 3 mL 0   No current facility-administered medications for this visit.    ROS: Review of Systems Per  HPI  Objective:  Psychiatric Specialty Exam: There were no vitals taken for this visit.There is no height or weight on file to calculate BMI.  General Appearance: Well Groomed  Eye Contact:  Good  Speech:  Clear and Coherent and Normal Rate  Volume:  Normal  Mood:  "Much better"  Affect:  Appropriate and Congruent  Thought Content: Hallucinations: Auditory Visual; mostly logical. Chronic AVH, but less  Suicidal Thoughts:  Yes.  without intent/plan, chronic passive SI  Homicidal Thoughts:  No  Thought Process:  Coherent  Orientation:  Full (Time, Place, and Person)    Memory: Immediate;   Good Recent;   Fair Remote;   Poor  Judgment:  Fair  Insight:  Fair  Concentration:  Concentration: Good and Attention Span: Good  Recall: not formally  assessed   Fund of Knowledge: Fair  Language: Good  Psychomotor Activity:  TD; no forehead nor perioral movements noted today  Akathisia:  No  AIMS (if indicated): No abnormal movements noted  Assets:  Communication Skills Desire for Improvement Housing Leisure Time Social Support Vocational/Educational  ADL's:  Intact  Cognition: WNL  Sleep:  Fair, improved   PE: General: well-appearing; no acute distress  Pulm: no increased work of breathing on room air  Strength & Muscle Tone: within normal limits Neuro: no focal neurological deficits observed  Gait & Station: normal on video visit  Metabolic Disorder Labs: Lab Results  Component Value Date   HGBA1C 6.0 (H) 10/08/2023   MPG >398 10/27/2022   No results found for: "PROLACTIN" Lab Results  Component Value Date   CHOL 247 (H) 10/08/2023   TRIG 254 (H) 10/08/2023   HDL 59 10/08/2023   CHOLHDL 4.2 10/08/2023   LDLCALC 142 (H) 10/08/2023   Lab Results  Component Value Date   TSH 3.590 10/08/2023    Therapeutic Level Labs: No results found for: "LITHIUM" No results found for: "VALPROATE" No results found for: "CBMZ"  Screenings: PHQ2-9    Flowsheet Row Office  [facility] from [facility] in [facility] Video Visit from 03/13/2023 in [facility] Video Visit from 01[facility]16/2023 in [facility] Video Visit from 09[facility]12/2022 in [facility] Video Visit from 05[facility]30/2022 in [facility]  PHQ-2 Total Score 4 2 2 2 2   PHQ-9 Total Score 18 5 12 8 5       Flowsheet Row Office Visit from 06/09/2023 in [facility] Video Visit from 03/13/2023 in [facility] Video Visit from 01/16/2023 in [facility]  C-SSRS RISK CATEGORY High Risk Error: Q3, 4, or 5 should not be populated when Q2 is No Error: Q3, 4, or 5 should not be populated when Q2 is No       Collaboration of Care: Collaboration of Care: Dr. Sharalyn Dasen  Patient/Guardian was advised Release of Information must be obtained prior to any record release in order to collaborate their care with an outside provider. Patient/Guardian was advised if they have not already done so to contact the registration department to sign all necessary forms in order for us  to release information regarding their care.   Consent: Patient/Guardian gives verbal consent for treatment and assignment of benefits for services provided during this visit. Patient/Guardian expressed understanding and agreed to proceed.    Shery Done, MD 04/14/2024 3:40 PM

## 2024-04-19 MED ORDER — ARIPIPRAZOLE 20 MG PO TABS: 20.0000 mg | ORAL_TABLET | Freq: Every day | ORAL | 1 refills | Status: DC

## 2024-04-19 MED ORDER — INGREZZA 60 MG PO CAPS: 60.0000 mg | ORAL_CAPSULE | Freq: Every day | ORAL | 1 refills | Status: DC

## 2024-04-19 MED ORDER — PROPRANOLOL HCL 20 MG PO TABS: 20.0000 mg | ORAL_TABLET | Freq: Two times a day (BID) | ORAL | 1 refills | Status: DC

## 2024-04-21 ENCOUNTER — Ambulatory Visit: Admitting: Internal Medicine

## 2024-04-21 ENCOUNTER — Encounter: Payer: Self-pay | Admitting: Internal Medicine

## 2024-04-21 VITALS — BP 126/86 | HR 96 | Temp 97.9°F | Resp 18 | Ht 67.0 in | Wt 180.6 lb

## 2024-04-21 DIAGNOSIS — E782 Mixed hyperlipidemia: Secondary | ICD-10-CM | POA: Diagnosis not present

## 2024-04-21 DIAGNOSIS — E119 Type 2 diabetes mellitus without complications: Secondary | ICD-10-CM | POA: Diagnosis not present

## 2024-04-21 NOTE — Assessment & Plan Note (Signed)
 We will check her FLP today and see how well the new lipitor is doing for controlling her cholesterol.

## 2024-04-21 NOTE — Progress Notes (Signed)
 Office Visit  Subjective   Patient ID: Kelly Beasley   DOB: May 21, 1971   Age: 53 y.o.   MRN: [id number]   Chief Complaint Chief Complaint  Patient presents with   Follow-up    67m f/u     History of Present Illness Kelly Beasley is a 53 y.o. female who who returns today for followup of her T2 diabetes.  She was diagnosed with T2 diabetes in 2023 where she was started on medications at that time.  She has been seen in the ED multiple times in the last year with either hyperglycemia or hypoglycemia.  She admitted to me she had some noncompliance issues with not picking her meds up at the pharmacy.  She is currently on metformin  1000mg  BID and Lantus  78 units subcut daily and ozempic  1mg  subcut weekly.  She states that her lantus  needs to be mail ordered.  She is also on novolog  sliding scale which she only takes when her blood sugar are >200-300.  She states now that she never has to use sliding scale.  She has been in the ER at [facility] in the past  for hypoglycemia due to her not eating and her sugars going low.  She usually skips breakfast and will eat lunch and dinner or she may skip breakfast and lunch and eat at night.  She was seen in the ER several weeks at [facility] due to her FSBS >400.  They also told her to go get her medicines and be compliant.  She is not walking as much as they would like.. She specifically denies unexplained abdominal pain, nausea or vomiting or diarrhea.  She does check her blood sugars twice a day where she checks them before breakfast and lunch.  Her sugars are now runing 400.  Her last HgBA1c was done 6 months ago and was 6%. She has no long term complications of diabetic retinopathy, nephropathy, neuropathy or cardiovascular disease.  She did see Luetta Sago on [facility] in 02/2024 but I do not have these results.  The patient also has a history of mixed hyperlipidemia.  This was diagnosed when she initially came and saw me in 09/2023.  I started her  on lipitor at that time.  Today, she denies any abdominal pain, nausea, vomiting, fatigue or myalgias.  She is currently controlling her cholesterol with lipitor 40mg  at bedtime.  She is not fasting for today's labs.     Past Medical History Past Medical History:  Diagnosis Date   Anxiety    Bipolar disorder (HCC)    Bipolar I disorder, most recent episode depressed (HCC) 08/27/2018   Depression      Allergies No Known Allergies   Medications  Current Outpatient Medications:    ARIPiprazole  (ABILIFY ) 20 MG tablet, Take 1 tablet (20 mg total) by mouth daily., Disp: 30 tablet, Rfl: 1   atorvastatin  (LIPITOR) 40 MG tablet, Take 1 tablet (40 mg total) by mouth daily., Disp: 60 tablet, Rfl: 2   Continuous Glucose Sensor (FREESTYLE LIBRE 3 SENSOR) MISC, 78 Units by Does not apply route daily. Place 1 sensor on the skin every 14 days. Use to check glucose continuously, Disp: 2 each, Rfl: 3   glucose blood (FREESTYLE INSULINX TEST) test strip, Use as instructed, Disp: 100 each, Rfl: 12   INGREZZA  60 MG capsule, Take 1 capsule (60 mg total) by mouth daily., Disp: 30 capsule, Rfl: 1   insulin  glargine (LANTUS  SOLOSTAR) 100 UNIT/ML Solostar Pen, Inject  78 Units into the skin daily., Disp: 70.2 mL, Rfl: 3   Lancets (FREESTYLE) lancets, Use as instructed, Disp: 100 each, Rfl: 12   metFORMIN  (GLUCOPHAGE -XR) 500 MG 24 hr tablet, Take 2 tablets (1,000 mg total) by mouth 2 (two) times daily with a meal., Disp: 360 tablet, Rfl: 3   propranolol  (INDERAL ) 20 MG tablet, Take 1 tablet (20 mg total) by mouth 2 (two) times daily., Disp: 60 tablet, Rfl: 1   Semaglutide , 1 MG/DOSE, (OZEMPIC , 1 MG/DOSE,) 4 MG/3ML SOPN, Inject 1 mg into the skin once a week. (Patient not taking: Reported on 11/05/2023), Disp: , Rfl:    Semaglutide , 1 MG/DOSE, (OZEMPIC , 1 MG/DOSE,) 4 MG/3ML SOPN, Inject 1 mg into the skin once a week., Disp: 3 mL, Rfl: 5   Semaglutide ,0.25 or 0.5MG /DOS, (OZEMPIC , 0.25 OR 0.5 MG/DOSE,) 2 MG/3ML  SOPN, Inject 0.5 mg into the skin once a week., Disp: 3 mL, Rfl: 0   Review of Systems Review of Systems  Constitutional:  Negative for chills, fever and malaise/fatigue.  Eyes:  Positive for blurred vision. Negative for double vision.  Respiratory:  Negative for cough and shortness of breath.   Cardiovascular:  Negative for chest pain, palpitations and leg swelling.  Gastrointestinal:  Negative for abdominal pain, constipation, diarrhea, nausea and vomiting.  Genitourinary:  Negative for frequency.  Musculoskeletal:  Negative for myalgias.  Skin:  Negative for itching and rash.  Neurological:  Negative for dizziness, weakness and headaches.  Endo/Heme/Allergies:  Negative for polydipsia.       Objective:    Vitals BP 126/86   Pulse 96   Temp 97.9 F (36.6 C) (Temporal)   Resp 18   Ht 5\' 7"  (1.702 m)   Wt 180 lb 9.6 oz (81.9 kg)   SpO2 99%   BMI 28.29 kg/m    Physical Examination Physical Exam Constitutional:      Appearance: Normal appearance. She is not ill-appearing.  Cardiovascular:     Rate and Rhythm: Normal rate and regular rhythm.     Pulses: Normal pulses.     Heart sounds: No murmur heard.    No friction rub. No gallop.  Pulmonary:     Effort: Pulmonary effort is normal. No respiratory distress.     Breath sounds: No wheezing, rhonchi or rales.  Abdominal:     General: Bowel sounds are normal. There is no distension.     Palpations: Abdomen is soft.     Tenderness: There is no abdominal tenderness.  Musculoskeletal:     Right lower leg: No edema.     Left lower leg: No edema.  Skin:    General: Skin is warm and dry.     Findings: No rash.  Neurological:     Mental Status: She is alert.        Assessment & Plan:   Diabetes mellitus without complication (HCC) We will check her HgBa1c.  She has been on her medications faithfully for the last 3 months.  Our goal will be to have a A1c <7%.  I am also going to check her urine for protein.  Mixed  hyperlipidemia We will check her FLP today and see how well the new lipitor is doing for controlling her cholesterol.    Return in about 3 months (around 07/22/2024).   Wayne Haines, MD

## 2024-04-21 NOTE — Assessment & Plan Note (Signed)
 We will check her HgBa1c.  She has been on her medications faithfully for the last 3 months.  Our goal will be to have a A1c <7%.  I am also going to check her urine for protein.

## 2024-04-22 LAB — CMP14 + ANION GAP
ALT: 22 IU/L (ref 0–32)
AST: 14 IU/L (ref 0–40)
Albumin: 4.6 g/dL (ref 3.8–4.9)
Alkaline Phosphatase: 147 IU/L — ABNORMAL HIGH (ref 44–121)
Anion Gap: 17 mmol/L (ref 10.0–18.0)
BUN/Creatinine Ratio: 13 (ref 9–23)
BUN: 9 mg/dL (ref 6–24)
Bilirubin Total: 0.3 mg/dL (ref 0.0–1.2)
CO2: 22 mmol/L (ref 20–29)
Calcium: 10.1 mg/dL (ref 8.7–10.2)
Chloride: 104 mmol/L (ref 96–106)
Creatinine, Ser: 0.69 mg/dL (ref 0.57–1.00)
Globulin, Total: 2.7 g/dL (ref 1.5–4.5)
Glucose: 135 mg/dL — ABNORMAL HIGH (ref 70–99)
Potassium: 4.2 mmol/L (ref 3.5–5.2)
Sodium: 143 mmol/L (ref 134–144)
Total Protein: 7.3 g/dL (ref 6.0–8.5)
eGFR: 103 mL/min/{1.73_m2} (ref >59)

## 2024-04-22 LAB — MICROALBUMIN / CREATININE URINE RATIO
Creatinine, Urine: 331.7 mg/dL
Microalb/Creat Ratio: 27 mg/g{creat} (ref 0–29)
Microalbumin, Urine: 88.3 ug/mL

## 2024-04-22 LAB — LIPID PANEL
Chol/HDL Ratio: 2.6 ratio (ref 0.0–4.4)
Cholesterol, Total: 195 mg/dL (ref 100–199)
HDL: 75 mg/dL (ref >39)
LDL Chol Calc (NIH): 108 mg/dL — ABNORMAL HIGH (ref 0–99)
Triglycerides: 63 mg/dL (ref 0–149)
VLDL Cholesterol Cal: 12 mg/dL (ref 5–40)

## 2024-04-22 LAB — HEMOGLOBIN A1C
Est. average glucose Bld gHb Est-mCnc: 166 mg/dL
Hgb A1c MFr Bld: 7.4 % — ABNORMAL HIGH (ref 4.8–5.6)

## 2024-04-23 NOTE — Addendum Note (Signed)
 Addended by: Donnelly Gainer on: 04/23/2024 09:13 AM   Modules accepted: Level of Service

## 2024-04-25 ENCOUNTER — Other Ambulatory Visit (HOSPITAL_COMMUNITY): Payer: Self-pay | Admitting: Student

## 2024-04-25 DIAGNOSIS — F411 Generalized anxiety disorder: Secondary | ICD-10-CM

## 2024-04-27 ENCOUNTER — Ambulatory Visit: Payer: Self-pay

## 2024-04-27 ENCOUNTER — Other Ambulatory Visit: Payer: Self-pay

## 2024-04-27 MED ORDER — LANTUS SOLOSTAR 100 UNIT/ML ~~LOC~~ SOPN: 80.0000 [IU] | PEN_INJECTOR | Freq: Every day | SUBCUTANEOUS | 3 refills | Status: DC

## 2024-04-27 NOTE — Progress Notes (Signed)
 Patient called.  Patient aware.  I have called and informed the patient  "Her A1c is 7.4%. I want her to take lantus  80 Units daily. Her LDL is a bit elevated. I want her to cut carbs and fats from diet and exercise. '.  Pt aware, I have sent ina  refill of her Lantus  80 units daily.

## 2024-05-12 ENCOUNTER — Other Ambulatory Visit (HOSPITAL_COMMUNITY): Payer: Self-pay | Admitting: Student

## 2024-05-12 ENCOUNTER — Telehealth (HOSPITAL_COMMUNITY): Admitting: Student

## 2024-05-12 DIAGNOSIS — F250 Schizoaffective disorder, bipolar type: Secondary | ICD-10-CM | POA: Diagnosis not present

## 2024-05-12 DIAGNOSIS — F411 Generalized anxiety disorder: Secondary | ICD-10-CM | POA: Diagnosis not present

## 2024-05-12 DIAGNOSIS — G2401 Drug induced subacute dyskinesia: Secondary | ICD-10-CM

## 2024-05-12 DIAGNOSIS — T43505A Adverse effect of unspecified antipsychotics and neuroleptics, initial encounter: Secondary | ICD-10-CM

## 2024-05-12 DIAGNOSIS — Z79899 Other long term (current) drug therapy: Secondary | ICD-10-CM

## 2024-05-12 NOTE — Progress Notes (Unsigned)
 Televisit via video: I connected with patient on 05/12/2024  at  2:30 PM EDT by a video enabled telemedicine application and verified that I am speaking with the correct person using two identifiers.  Location: Patient: Work, in private area Provider: Office   I discussed the limitations of evaluation and management by telemedicine and the availability of in person appointments. The patient expressed understanding and agreed to proceed.  I discussed the assessment and treatment plan with the patient. The patient was provided an opportunity to ask questions and all were answered. The patient agreed with the plan and demonstrated an understanding of the instructions.   The patient was advised to call back or seek an in-person evaluation if the symptoms worsen or if the condition fails to improve as anticipated.  I spent 16 minutes in direct patient care.   BH MD Outpatient Progress Note  05/12/2024 2:37 PM   Kelly Beasley  MRN:  [id number]  Assessment:  Kelly Beasley presents for follow-up evaluation virtually. Today, 05/12/2024 , patient reports she has noted further improvement with titration of Abilify  in terms of mood lability and sleep.  She denies adverse effects of the medication.  Since increasing the medication, her auditory and visual hallucinations have become .  As well, with increase in Propranolol , her anxiety and akathisia are further improved, no longer causing her distress. Patient requests an increase in Abilify . We discuss her previous sensitivity to dose increases and sx of which to be mindful. She voices understanding and is agreeable to the increase in Abilify . We will otherwise make no further adjustments.   Patient continues to respond well to the increase in her bedtime hydroxyzine .  Her sleep is improved with this adjustment.  Patient reports some less chronic SI, also without intent nor plan. Patient poses no safety threats to herself nor others at this  time.  Involuntary movements remain improved with Ingrezza .  Will make no changes to dosage at this time.   Identifying Information: Kelly Beasley is a 53 y.o. female with a history of schizoaffective disorder-bipolar type, GAD, and panic disorder who is an established patient with [facility] for management of depression, anxiety, and insomnia.   Risk Assessment: An assessment of suicide and violence risk factors was performed as part of this evaluation and is not significantly changed from the last visit.             While future psychiatric events cannot be accurately predicted, the patient does not  currently require acute inpatient psychiatric care and does not currently meet [place]  involuntary commitment criteria.          Plan:  # Schizoaffective Disorder, bipolar type Status of problem: Fairly managed Interventions: Previously discontinued olanzapine  and loxapine  due to adverse effects -- Continue Abilify  20 mg daily -- Continue propranolol  20 mg twice daily for akathisia and anxiety  # GAD Status of problem: Some improved Interventions: Patient discontinued Effexor  225 mg daily -- Continue hydroxyzine  50 mg nightly as needed sleep -- Continue propranolol  20 mg twice daily for anxiety and akathisia  # Tardive Dyskinesia Past medication trials: Cogentin  Status of problem: Stable Interventions: -- Continue Ingrezza  60 mg daily.  Return to care in approx. 6 weeks with Dr. Flynn Hylan, as this writer will be leaving the practice in late   Patient was given contact information for behavioral health clinic and was instructed to call 911 for emergencies.    Patient and plan of care will be discussed  with the Attending MD ,Dr. Sharalyn Dasen, who agrees with the above statement and plan.   Subjective:  Chief Complaint:  No chief complaint on file.   Interval History: Patient reports further improvements with the increase in Abilify . She denies  adverse effects. She is sleeping better, no longer waking as frequently. Sleeping an average of 6 hours per night. Her mood is good and anxiety is some improved, more often about the same.  She still much less akathetic overall, but there is still some present. Voices are louder and more frequent, and shadows still present, as well as a silhouette of an unfamiliar person; this does cause some distress. More frequently that she sees shadows in periphery, but still there.Last vision check last week. She had cataracts removed on 5/8 and 5/23 and will have to wear glasses due to astigmatism. Shadows worsened after removal. She will visit optometrist on 6/24 to be assessed for glasses.     Takes hydroxyzine  nightly. Compliant with medications. TD movements well controlled.  Propranolol  working well.   Her mood is overall improved, and she continues to tolerate the Abilify  without adverse effects.  She is medication compliant with Abilify , propranolol , hydroxyzine , and Ingrezza .  The increase in hydroxyzine  at bedtime proved beneficial for her sleep, and she is now getting a sufficient amount of sleep and feeling more well rested the next day.  Taking Ingrezza  which has helped tremendously with involuntary perioral and forehead movements. Denies UE stiffness. Voiding appropriately.   Appetite is good, snacking a lot. She denies hypoglycemic episodes and pretty good management of blood glucose.   Denies active SI, passive SI is chronic, but some less in frequency. Kids are protective. Denies HI.   She denies tobacco, alcohol, and illicit drug use.   Visit Diagnosis:  No diagnosis found.      Past Psychiatric History:  Diagnoses: Schizoaffective disorder-bipolar type  Medication trials: Seroquel , Prozac  Previous psychiatrist/therapist: Dr. Katrine Parody Hospitalizations: Denies Suicide attempts: denies SIB: Denies Hx of violence towards others: Denies Current access to guns: Denies Hx of  trauma/abuse: Denies Substance use: Denies  06/09/23: Considered that in 10/2022, patient experienced some psychosis while delirious 2/2 hyperglycemia, but these sx have been persistent and occur when blood glucose is in a normal range, and also occur outside of mood sx. As well, patient is within the age range of the bimodal onset of schizophrenia in female patients. As psychiatric diagnoses can be dynamic, will continue to assess in subsequent visits to ensure correct diagnosis.  Patient has a hx of requiring maximal dosages of medications and developing tolerance. As she trialed Seroquel  and now Olanzapine , believe it more appropriate to move to an FGA, particularly with schizophrenia spectrum dx. Considered Haldol, but will opt for an agent less likely to worsen TD sx, Loxapine . If patient has insufficient response to Loxapine , will instead consider Abilify , as it has a different receptor profile from the other two SGAs.  10/2023: Patient had adverse reaction to increase in Loxapine  (dizziness, heaviness while walking), and discontinued medication. Opted to switch to Abilify  during this visit.  Past Medical History:  Past Medical History:  Diagnosis Date   Anxiety    Bipolar disorder (HCC)    Bipolar I disorder, most recent episode depressed (HCC) 08/27/2018   Depression    No past surgical history on file.  Family Psychiatric History: Mom-chronic insomnia  Family History:  Family History  Problem Relation Age of Onset   Cancer Father     Social History:  Academic/Vocational:  Social History   Socioeconomic History   Marital status: Divorced    Spouse name: Not on file   Number of children: Not on file   Years of education: Not on file   Highest education level: Not on file  Occupational History   Not on file  Tobacco Use   Smoking status: Never   Smokeless tobacco: Never  Vaping Use   Vaping status: Never Used  Substance and Sexual Activity   Alcohol use: No   Drug  use: No   Sexual activity: Not on file  Other Topics Concern   Not on file  Social History Narrative   Not on file   Social Drivers of Health   Financial Resource Strain: Not on file  Food Insecurity: Not on file  Transportation Needs: Not on file  Physical Activity: Not on file  Stress: Not on file  Social Connections: Not on file    Allergies: No Known Allergies  Current Medications: Current Outpatient Medications  Medication Sig Dispense Refill   ARIPiprazole  (ABILIFY ) 20 MG tablet Take 1 tablet (20 mg total) by mouth daily. 30 tablet 1   atorvastatin  (LIPITOR) 40 MG tablet Take 1 tablet (40 mg total) by mouth daily. 60 tablet 2   Continuous Glucose Sensor (FREESTYLE LIBRE 3 SENSOR) MISC 78 Units by Does not apply route daily. Place 1 sensor on the skin every 14 days. Use to check glucose continuously 2 each 3   glucose blood (FREESTYLE INSULINX TEST) test strip Use as instructed 100 each 12   INGREZZA  60 MG capsule Take 1 capsule (60 mg total) by mouth daily. 30 capsule 1   insulin  glargine (LANTUS  SOLOSTAR) 100 UNIT/ML Solostar Pen Inject 80 Units into the skin daily. 70.2 mL 3   Lancets (FREESTYLE) lancets Use as instructed 100 each 12   metFORMIN  (GLUCOPHAGE -XR) 500 MG 24 hr tablet Take 2 tablets (1,000 mg total) by mouth 2 (two) times daily with a meal. 360 tablet 3   propranolol  (INDERAL ) 20 MG tablet Take 1 tablet (20 mg total) by mouth 2 (two) times daily. 60 tablet 1   Semaglutide , 1 MG/DOSE, (OZEMPIC , 1 MG/DOSE,) 4 MG/3ML SOPN Inject 1 mg into the skin once a week. (Patient not taking: Reported on 11/05/2023)     Semaglutide , 1 MG/DOSE, (OZEMPIC , 1 MG/DOSE,) 4 MG/3ML SOPN Inject 1 mg into the skin once a week. 3 mL 5   Semaglutide ,0.25 or 0.5MG /DOS, (OZEMPIC , 0.25 OR 0.5 MG/DOSE,) 2 MG/3ML SOPN Inject 0.5 mg into the skin once a week. 3 mL 0   No current facility-administered medications for this visit.    ROS: Review of Systems Per HPI  Objective:  Psychiatric  Specialty Exam: There were no vitals taken for this visit.There is no height or weight on file to calculate BMI.  General Appearance: Well Groomed  Eye Contact:  Good  Speech:  Clear and Coherent and Normal Rate  Volume:  Normal  Mood:  Much better  Affect:  Appropriate and Congruent  Thought Content: Hallucinations: Auditory Visual; mostly logical. Chronic AVH, but less  Suicidal Thoughts:  Yes.  without intent/plan, chronic passive SI  Homicidal Thoughts:  No  Thought Process:  Coherent  Orientation:  Full (Time, Place, and Person)    Memory: Immediate;   Good Recent;   Fair Remote;   Poor  Judgment:  Fair  Insight:  Fair  Concentration:  Concentration: Good and Attention Span: Good  Recall: not formally assessed   Fund of Knowledge: Fair  Language: Good  Psychomotor Activity:  TD; no forehead nor perioral movements noted today  Akathisia:  No  AIMS (if indicated): No abnormal movements noted  Assets:  Communication Skills Desire for Improvement Housing Leisure Time Social Support Vocational/Educational  ADL's:  Intact  Cognition: WNL  Sleep:  Fair, improved   PE: General: well-appearing; no acute distress  Pulm: no increased work of breathing on room air  Strength & Muscle Tone: within normal limits Neuro: no focal neurological deficits observed  Gait & Station: normal on video visit  Metabolic Disorder Labs: Lab Results  Component Value Date   HGBA1C 7.4 (H) 04/21/2024   MPG >398 10/27/2022   No results found for: PROLACTIN Lab Results  Component Value Date   CHOL 195 04/21/2024   TRIG 63 04/21/2024   HDL 75 04/21/2024   CHOLHDL 2.6 04/21/2024   LDLCALC 108 (H) 04/21/2024   LDLCALC 142 (H) 10/08/2023   Lab Results  Component Value Date   TSH 3.590 10/08/2023    Therapeutic Level Labs: No results found for: LITHIUM No results found for: VALPROATE No results found for: CBMZ  Screenings: PHQ2-9    Flowsheet Row Office Visit from  [facility] in [facility] Video Visit from 03/13/2023 in [facility] Video Visit from 01[facility]16/2023 in [facility] Video Visit from 09[facility]12/2022 in [facility] Video Visit from 05/30/2022 in [facility]  PHQ-2 Total Score 4 2 2 2 2   PHQ-9 Total Score 18 5 12 8 5    Flowsheet Row Office Visit from 06[facility]09/2023 in [facility] Video Visit from 03/13/2023 in [facility] Video Visit from 01/16/2023 in [facility]  C-SSRS RISK CATEGORY High Risk Error: Q3, 4, or 5 should not be populated when Q2 is No Error: Q3, 4, or 5 should not be populated when Q2 is No    Collaboration of Care: Collaboration of Care: Dr. Sharalyn Dasen  Patient/Guardian was advised Release of Information must be obtained prior to any record release in order to collaborate their care with an outside provider. Patient/Guardian was advised if they have not already done so to contact the registration department to sign all necessary forms in order for us  to release information regarding their care.   Consent: Patient/Guardian gives verbal consent for treatment and assignment of benefits for services provided during this visit. Patient/Guardian expressed understanding and agreed to proceed.    Shery Done, MD 05/12/2024 2:37 PM

## 2024-05-17 ENCOUNTER — Encounter (HOSPITAL_COMMUNITY): Payer: Self-pay | Admitting: Student

## 2024-05-17 MED ORDER — PROPRANOLOL HCL 20 MG PO TABS: 20.0000 mg | ORAL_TABLET | Freq: Two times a day (BID) | ORAL | 1 refills | Status: DC

## 2024-05-17 NOTE — Addendum Note (Signed)
 Addended by: CARVIN CROCK on: 05/17/2024 04:20 PM   Modules accepted: Level of Service

## 2024-06-04 ENCOUNTER — Other Ambulatory Visit: Payer: Self-pay

## 2024-06-04 MED ORDER — GLUCOSE BLOOD VI STRP: ORAL_STRIP | 12 refills | Status: DC

## 2024-06-18 NOTE — Progress Notes (Signed)
 BH MD Outpatient Progress Note  06/21/2024 6:19 PM Kelly Beasley  MRN:  [id number]  Assessment:  Kelly Beasley presents for follow-up evaluation in-person on 06/21/24. Formerly a patient of Dr. Rainelle, last seen on 05/12/2024  The patient presents with emerging psychosis in the setting of medication nonadherence, with internal preoccupation and guardedness noted, though her thought process was generally organized and her statements at times illogical. She was noted to be hypotensive on arrival (BP 98/65, HR 96), which improved with oral hydration (BP 126/75, HR 80) and reported poor oral intake. She has a well documented hx of intermittent adherence. Of note, even when reportedly adherent, she appears to have breakthrough psychotic symptoms on Abilify  She declined further evaluation in urgent care. Safety planning was completed with her son. He was provided both verbal and written instructions on the importance of medication adherence and guidance on presenting to the ED or behavioral health urgent care should her condition worsen.Will continue outpatient management with close monitoring.   Identifying Information: Kelly Beasley is a 53 y.o. female with a history of Schizoaffective disorder, bipolar type, GAD, and TD who is an established patient with [facility] for management of depression, anxiety, and insomnia .For a comprehensive history and detailed assessment, please refer to the initial adult assessment.  The patient's PMHx is significant for T2DM uncontrolled (A1c 7.4 on 04/21/2024).   Plan:  # Schizoaffective Disorder, bipolar type Status of problem: Fairly managed Interventions: Previously discontinued olanzapine  and loxapine  due to adverse effects -- Restart Abilify  10 mg for 3 days, then increase back to 20 mg daily  -- Continue propranolol  20 mg twice daily for akathisia and anxiety   # GAD Status of problem: Some improved Interventions: Patient  discontinued Effexor  225 mg daily -- Continue hydroxyzine  50 mg nightly as needed sleep -- Continue propranolol  20 mg twice daily for anxiety and akathisia   # Tardive Dyskinesia Past medication trials: Cogentin  Status of problem: Stable Interventions: -- Continue Ingrezza  60 mg daily.   Health maintenance, PCP: Fleeta Valeria Mayo, MD T2DM --  Metformin  1000 mg twice daily, Lantus  80 units subcutaneous daily, Ozempic  1 mg subcutaneous weekly  Patient was given contact information for behavioral health clinic and was instructed to call 911 for emergencies.   Subjective:  Chief Complaint:  Chief Complaint  Patient presents with   Follow-up   Anxiety   Medication Refill    Interval History:  The patient presents today reporting worsening anxiety over the past 3-4 weeks, describing a vague but persistent sense that she is losing control of her family and job. She is unable to clearly articulate the specific content or triggers of her anxiety but notes that it has been accompanied by worsening sleep, averaging 4-5 hours per night with frequent nighttime awakenings. She reports that her anxiety contributes to both difficulty initiating and maintaining sleep. The patient also reports she ran out of her medications two weeks ago but did not reach out to the clinic, and she is unable to explain why she did not seek assistance. Since then, she describes increased psychomotor agitation, difficulty sitting still, and a worsening of passive suicidal ideation. She endorses ongoing auditory and visual hallucinations, though they are not command in nature. Her son accompanied her to the visit and confirmed that he is available to support her. Per the patient's request, her psychiatric history and symptoms were not discussed in detail in his presence. However, safety planning was reviewed with both the patient  and her son, including the importance of obtaining her medications today and seeking urgent  psychiatric care if symptoms escalate. Son verbalized understanding.   Visit Diagnosis:    ICD-10-CM   1. Schizoaffective disorder, bipolar type (HCC)  F25.0 ARIPiprazole  (ABILIFY ) 20 MG tablet    ARIPiprazole  (ABILIFY ) 10 MG tablet      Past Psychiatric History:  Diagnoses: Schizoaffective disorder-bipolar type  Medication trials: Seroquel , Prozac , Olanzapine , loxapine  (dizziness, heaviness while walking) Previous psychiatrist/therapist: Dr. Brutus Hospitalizations: Denies Suicide attempts: denies SIB: Denies Hx of violence towards others: Denies Current access to guns: Denies Hx of trauma/abuse: Denies Substance use: Denies  Social History   Socioeconomic History   Marital status: Divorced    Spouse name: Not on file   Number of children: Not on file   Years of education: Not on file   Highest education level: Not on file  Occupational History   Not on file  Tobacco Use   Smoking status: Never   Smokeless tobacco: Never  Vaping Use   Vaping status: Never Used  Substance and Sexual Activity   Alcohol use: No   Drug use: No   Sexual activity: Not on file  Other Topics Concern   Not on file  Social History Narrative   Not on file   Social Drivers of Health   Financial Resource Strain: Not on file  Food Insecurity: Not on file  Transportation Needs: Not on file  Physical Activity: Not on file  Stress: Not on file  Social Connections: Not on file    Allergies: No Known Allergies  Current Medications: Current Outpatient Medications  Medication Sig Dispense Refill   ARIPiprazole  (ABILIFY ) 10 MG tablet Take 1 tablet (10 mg total) by mouth daily. 3 tablet 0   ARIPiprazole  (ABILIFY ) 20 MG tablet Take 1 tablet (20 mg total) by mouth daily. 30 tablet 2   atorvastatin  (LIPITOR) 40 MG tablet Take 1 tablet (40 mg total) by mouth daily. 60 tablet 2   Continuous Glucose Sensor (FREESTYLE LIBRE 3 SENSOR) MISC 78 Units by Does not apply route daily. Place 1 sensor on  the skin every 14 days. Use to check glucose continuously 2 each 3   glucose blood test strip Use as instructed 100 each 12   INGREZZA  60 MG capsule Take 1 capsule (60 mg total) by mouth daily. 30 capsule 1   insulin  glargine (LANTUS  SOLOSTAR) 100 UNIT/ML Solostar Pen Inject 80 Units into the skin daily. 70.2 mL 3   Lancets (FREESTYLE) lancets Use as instructed 100 each 12   metFORMIN  (GLUCOPHAGE -XR) 500 MG 24 hr tablet Take 2 tablets (1,000 mg total) by mouth 2 (two) times daily with a meal. 360 tablet 3   propranolol  (INDERAL ) 20 MG tablet Take 1 tablet (20 mg total) by mouth 2 (two) times daily. 60 tablet 1   Semaglutide , 1 MG/DOSE, (OZEMPIC , 1 MG/DOSE,) 4 MG/3ML SOPN Inject 1 mg into the skin once a week. (Patient not taking: Reported on 11/05/2023)     Semaglutide , 1 MG/DOSE, (OZEMPIC , 1 MG/DOSE,) 4 MG/3ML SOPN Inject 1 mg into the skin once a week. 3 mL 5   Semaglutide ,0.25 or 0.5MG /DOS, (OZEMPIC , 0.25 OR 0.5 MG/DOSE,) 2 MG/3ML SOPN Inject 0.5 mg into the skin once a week. 3 mL 0   No current facility-administered medications for this visit.    ROS: Review of Systems  All other systems reviewed and are negative.   Objective:  Objective: Psychiatric Specialty Exam: General Appearance: Casual, fairly groomed  Eye  Contact:  Fleeting  Speech:  Clear, coherent, normal rate, spontaneous  Volume:  Normal   Mood:  anxious  Affect:  Flat  Thought Content: illogical, concern for paranoid ideations, hypervigilance, subjective reports of AVH  Suicidal Thoughts: see subjective  Thought Process: linear, organized, ruminative  Orientation:  A&Ox4   Memory:  Immediate good  Judgment:  Partial -limited by psychosis  Insight:  Limited  Concentration:  Attention and concentration good   Recall:  unable to formally assess  Fund of Knowledge:  unable to formally assess  Language: Good, fluent  Psychomotor Activity: restlessness, pacing back and forth  Akathisia:  NA   AIMS (if  indicated): NA   Assets:   Communication Skills Social Support  ADL's:  Intact  Cognition: WNL  Sleep: see above  Appetite: see above    Physical Exam Vitals reviewed.  Constitutional:      General: She is not in acute distress.    Appearance: She is not ill-appearing.  HENT:     Head: Normocephalic and atraumatic.  Eyes:     Extraocular Movements: Extraocular movements intact.     Conjunctiva/sclera: Conjunctivae normal.  Pulmonary:     Effort: Pulmonary effort is normal. No respiratory distress.  Musculoskeletal:        General: Normal range of motion.  Skin:    General: Skin is warm and dry.  Neurological:     General: No focal deficit present.      Metabolic Disorder Labs: Lab Results  Component Value Date   HGBA1C 7.4 (H) 04/21/2024   MPG >398 10/27/2022   No results found for: PROLACTIN Lab Results  Component Value Date   CHOL 195 04/21/2024   TRIG 63 04/21/2024   HDL 75 04/21/2024   CHOLHDL 2.6 04/21/2024   LDLCALC 108 (H) 04/21/2024   LDLCALC 142 (H) 10/08/2023   Lab Results  Component Value Date   TSH 3.590 10/08/2023    Therapeutic Level Labs: No results found for: LITHIUM No results found for: VALPROATE No results found for: CBMZ  Screenings:  PHQ2-9    Flowsheet Row Office Visit from 06/09/2023 in [facility] Video Visit from 03/13/2023 in [facility] Video Visit from 01/16/2023 in [facility] Video Visit from 09/12/2022 in [facility] Video Visit from 05/30/2022 in [facility]  PHQ-2 Total Score 4 2 2 2 2   PHQ-9 Total Score 18 5 12 8 5    Flowsheet Row Office Visit from 06/09/2023 in [facility] Video Visit from 03/13/2023 in [facility] Video Visit from 01/16/2023 in  [facility]  C-SSRS RISK CATEGORY High Risk Error: Q3, 4, or 5 should not be populated when Q2 is No Error: Q3, 4, or 5 should not be populated when Q2 is No    Collaboration of Care: Collaboration of Care  Patient/Guardian was advised Release of Information must be obtained prior to any record release in order to collaborate their care with an outside provider. Patient/Guardian was advised if they have not already done so to contact the registration department to sign all necessary forms in order for us  to release information regarding their care.   Consent: Patient/Guardian gives verbal consent for treatment and assignment of benefits for services provided during this visit. Patient/Guardian expressed understanding and agreed to proceed.    Marlo Masson, MD 06/21/2024, 6:19 PM

## 2024-06-21 ENCOUNTER — Ambulatory Visit (HOSPITAL_BASED_OUTPATIENT_CLINIC_OR_DEPARTMENT_OTHER): Admitting: Student in an Organized Health Care Education/Training Program

## 2024-06-21 VITALS — BP 126/75 | HR 80

## 2024-06-21 DIAGNOSIS — F250 Schizoaffective disorder, bipolar type: Secondary | ICD-10-CM

## 2024-06-21 DIAGNOSIS — G2401 Drug induced subacute dyskinesia: Secondary | ICD-10-CM

## 2024-06-21 DIAGNOSIS — F411 Generalized anxiety disorder: Secondary | ICD-10-CM

## 2024-06-21 MED ORDER — PROPRANOLOL HCL 20 MG PO TABS: 20.0000 mg | ORAL_TABLET | Freq: Two times a day (BID) | ORAL | 2 refills | Status: DC

## 2024-06-21 MED ORDER — ARIPIPRAZOLE 20 MG PO TABS: 20.0000 mg | ORAL_TABLET | Freq: Every day | ORAL | 2 refills | Status: DC

## 2024-06-21 MED ORDER — ARIPIPRAZOLE 10 MG PO TABS: 10.0000 mg | ORAL_TABLET | Freq: Every day | ORAL | 0 refills | Status: DC

## 2024-06-21 MED ORDER — INGREZZA 60 MG PO CAPS: 60.0000 mg | ORAL_CAPSULE | Freq: Every day | ORAL | 2 refills | Status: DC

## 2024-06-23 ENCOUNTER — Ambulatory Visit (HOSPITAL_COMMUNITY): Admitting: Student in an Organized Health Care Education/Training Program

## 2024-06-23 NOTE — Addendum Note (Signed)
 Addended by: CARVIN CROCK on: 06/23/2024 11:16 AM   Modules accepted: Level of Service

## 2024-07-02 ENCOUNTER — Other Ambulatory Visit (HOSPITAL_COMMUNITY): Payer: Self-pay

## 2024-07-02 MED ORDER — INGREZZA 60 MG PO CAPS: 60.0000 mg | ORAL_CAPSULE | Freq: Every day | ORAL | 0 refills | Status: DC

## 2024-07-05 ENCOUNTER — Other Ambulatory Visit (HOSPITAL_COMMUNITY): Payer: Self-pay

## 2024-07-05 ENCOUNTER — Ambulatory Visit (HOSPITAL_COMMUNITY): Admitting: Student in an Organized Health Care Education/Training Program

## 2024-07-05 MED ORDER — INGREZZA 60 MG PO CAPS: 60.0000 mg | ORAL_CAPSULE | Freq: Every day | ORAL | 0 refills | Status: DC

## 2024-07-14 ENCOUNTER — Ambulatory Visit (HOSPITAL_COMMUNITY): Admitting: Student in an Organized Health Care Education/Training Program

## 2024-07-19 ENCOUNTER — Ambulatory Visit: Admitting: Internal Medicine

## 2024-08-02 ENCOUNTER — Ambulatory Visit (HOSPITAL_COMMUNITY): Admitting: Student in an Organized Health Care Education/Training Program

## 2024-08-08 NOTE — Progress Notes (Signed)
 BH MD Outpatient Progress Note  08/09/2024 5:24 PM Kelly Beasley  MRN:  [id number]  Assessment:  Loa Kelly Beasley presents for follow-up evaluation in-person on 08/09/24.  Since the last visit, the patient describes improved adherence to her psychiatric regimen, with notable subjective improvement in paranoia and hallucinations. Current psychotic symptoms are well controlled by her report. Considered further titration of Abilify , but at 20 mg, additional benefit is unlikely; Abilify  is appropriate to continue at the current dose. Sleep disturbance and poor appetite persist, likely exacerbated by a demanding work schedule and poor work-life balance. The patient is amenable to a trial of low-dose Remeron  to target these symptoms as well as CBT-I based strategies for improved sleep hygiene. Attendance at appointments has been inconsistent due to occupational demands. Discussed intermittent FMLA as a strategy to facilitate ongoing medication management and improve follow-up; patient is also open to virtual visits.   Identifying Information: Kelly Beasley is a 53 y.o. female with a history of Schizoaffective disorder, bipolar type, GAD, and TD who is an established patient with [facility] for management of depression, anxiety, and insomnia .For a comprehensive history and detailed assessment, please refer to the initial adult assessment. She is a former patient of Dr. Rainelle.   The patient's PMHx is significant for T2DM uncontrolled (A1c 7.4 on 04/21/2024).   Plan:  # Schizoaffective Disorder, bipolar type Status of problem: Stable Interventions: Previously discontinued olanzapine  and loxapine  due to adverse effects -- Continue Abilify  20 mg daily -- Continue propranolol  20 mg twice daily for akathisia and anxiety   # Insomnia Status of problem: New to me Interventions: Atarax , Trazodone   -- Start Remeron  7.5 mg nightly -- CBT-i  # GAD Status of problem:  Stable Interventions: Patient discontinued Effexor  225 mg daily -- Continue propranolol  20 mg twice daily for anxiety   # Tardive Dyskinesia Past medication trials: Cogentin  Status of problem: Stable Interventions: -- Continue Ingrezza  60 mg daily.   Health maintenance, PCP: Fleeta Valeria Mayo, MD T2DM --  Metformin  1000 mg twice daily, Lantus  80 units subcutaneous daily, Ozempic  1 mg subcutaneous weekly  Patient was given contact information for behavioral health clinic and was instructed to call 911 for emergencies.   Subjective:  Chief Complaint:  Chief Complaint  Patient presents with   Insomnia   Follow-up   Medication Refill    Interval History:  The patient reports overall stability and continued compliance with her medication regimen, denying any missed doses or adverse effects. She notes that stress and anxiety can trigger episodes of distrust and hypervigilance, though these symptoms are less distressing than in the past. Auditory hallucinations persist, characterized by hearing her name called throughout the day, but she finds these experiences less distressing and denies any command hallucinations. She observes that the voices are louder during periods of increased stress.  Paranoid thoughts remain present but are less troubling than previously. She denies recent substance use and does not report any adverse effects from her medications. Regarding mood, she acknowledges ongoing passive suicidal ideation, describing thoughts of not waking up, but firmly denies any intent or plan and states her children are her primary protective factor.  Sleep remains disrupted, with an average of six hours per night and persistent difficulty maintaining sleep, resulting in feeling unrested in the morning.  She reports sleeping with the television on in the evenings, and discussed strategies on how to minimize bright lights when it is time to sleep.  Appetite is stable, with intake  consisting of  one to two large meals daily and intermittent snacking. She does not report any recent changes in caffeine consumption.  Functionally, she denies any significant impact on her daily activities. She notes that she is able to manage pacing at work due to the nature of her job, which requires frequent movement. 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 She expresses anticipation for an upcoming three-day break next month.   Visit Diagnosis:    ICD-10-CM   1. Tardive dyskinesia  G24.01 INGREZZA  60 MG capsule    propranolol  (INDERAL ) 20 MG tablet    2. Schizoaffective disorder, bipolar type (HCC)  F25.0 ARIPiprazole  (ABILIFY ) 20 MG tablet    3. GAD (generalized anxiety disorder)  F41.1 propranolol  (INDERAL ) 20 MG tablet    4. Long term current use of antipsychotic medication  Z79.899 INGREZZA  60 MG capsule    5. Insomnia, unspecified type  G47.00 mirtazapine  (REMERON ) 7.5 MG tablet       Past Psychiatric History:  Diagnoses: Schizoaffective disorder-bipolar type  Medication trials: Seroquel , Prozac , Olanzapine , loxapine  (dizziness, heaviness while walking) Previous psychiatrist/therapist: Dr. Brutus Hospitalizations: Denies Suicide attempts: denies SIB: Denies Hx of violence towards others: Denies Current access to guns: Denies Hx of trauma/abuse: Denies Substance use: Denies  Social History   Socioeconomic History   Marital status: Divorced    Spouse name: Not on file   Number of children: Not on file   Years of education: Not on file   Highest education level: Not on file  Occupational History   Not on file  Tobacco Use   Smoking status: Never   Smokeless tobacco: Never  Vaping Use   Vaping status: Never Used  Substance and Sexual Activity   Alcohol use: No   Drug use: No   Sexual activity: Not on file  Other Topics  Concern   Not on file  Social History Narrative   Not on file   Social Drivers of Health   Financial Resource Strain: Not on file  Food Insecurity: Not on file  Transportation Needs: Not on file  Physical Activity: Not on file  Stress: Not on file  Social Connections: Not on file    Allergies: No Known Allergies  Current Medications: Current Outpatient Medications  Medication Sig Dispense Refill   mirtazapine  (REMERON ) 7.5 MG tablet Take 1 tablet (7.5 mg total) by mouth at bedtime. 90 tablet 0   ARIPiprazole  (ABILIFY ) 20 MG tablet Take 1 tablet (20 mg total) by mouth daily. 90 tablet 0   atorvastatin  (LIPITOR) 40 MG tablet Take 1 tablet (40 mg total) by mouth daily. 60 tablet 2   Continuous Glucose Sensor (FREESTYLE LIBRE 3 SENSOR) MISC 78 Units by Does not apply route daily. Place 1 sensor on the skin every 14 days. Use to check glucose continuously 2 each 3   glucose blood test strip Use as instructed 100 each 12   INGREZZA  60 MG capsule Take 1 capsule (60 mg total) by mouth daily. 90 capsule 0   insulin  glargine (LANTUS  SOLOSTAR) 100 UNIT/ML Solostar Pen Inject 80 Units into the skin daily. 70.2 mL 3   Lancets (FREESTYLE) lancets Use as instructed 100 each 12   metFORMIN  (GLUCOPHAGE -XR) 500 MG 24 hr tablet Take 2 tablets (1,000 mg total) by mouth 2 (two) times daily with a meal. 360 tablet 3  propranolol  (INDERAL ) 20 MG tablet Take 1 tablet (20 mg total) by mouth 2 (two) times daily. 60 tablet 2   Semaglutide , 1 MG/DOSE, (OZEMPIC , 1 MG/DOSE,) 4 MG/3ML SOPN Inject 1 mg into the skin once a week. (Patient not taking: Reported on 11/05/2023)     Semaglutide , 1 MG/DOSE, (OZEMPIC , 1 MG/DOSE,) 4 MG/3ML SOPN Inject 1 mg into the skin once a week. 3 mL 5   Semaglutide ,0.25 or 0.5MG /DOS, (OZEMPIC , 0.25 OR 0.5 MG/DOSE,) 2 MG/3ML SOPN Inject 0.5 mg into the skin once a week. 3 mL 0   No current facility-administered medications for this visit.    ROS: Review of Systems  All other  systems reviewed and are negative.   Objective:  Objective: Psychiatric Specialty Exam: General Appearance: Casual, fairly groomed  Eye Contact:  Fleeting  Speech:  Clear, coherent, normal rate, spontaneous  Volume:  Normal   Mood:  anxious  Affect:  Flat  Thought Content: illogical, concern for paranoid ideations, hypervigilance, subjective reports of AVH  Suicidal Thoughts: see subjective  Thought Process: linear, organized, ruminative  Orientation:  A&Ox4   Memory:  Immediate good  Judgment:  Partial -limited by psychosis  Insight:  Limited  Concentration:  Attention and concentration good   Recall:  unable to formally assess  Fund of Knowledge:  unable to formally assess  Language: Good, fluent  Psychomotor Activity: restlessness, pacing back and forth  Akathisia:  NA   AIMS (if indicated): NA   Assets:   Communication Skills Social Support  ADL's:  Intact  Cognition: WNL  Sleep: see above  Appetite: see above    Physical Exam Vitals reviewed.  Constitutional:      General: She is not in acute distress.    Appearance: She is not ill-appearing.  HENT:     Head: Normocephalic and atraumatic.  Eyes:     Extraocular Movements: Extraocular movements intact.     Conjunctiva/sclera: Conjunctivae normal.  Pulmonary:     Effort: Pulmonary effort is normal. No respiratory distress.  Musculoskeletal:        General: Normal range of motion.  Skin:    General: Skin is warm and dry.  Neurological:     General: No focal deficit present.      Metabolic Disorder Labs: Lab Results  Component Value Date   HGBA1C 7.4 (H) 04/21/2024   MPG >398 10/27/2022   No results found for: PROLACTIN Lab Results  Component Value Date   CHOL 195 04/21/2024   TRIG 63 04/21/2024   HDL 75 04/21/2024   CHOLHDL 2.6 04/21/2024   LDLCALC 108 (H) 04/21/2024   LDLCALC 142 (H) 10/08/2023   Lab Results  Component Value Date   TSH 3.590 10/08/2023    Therapeutic Level Labs: No  results found for: LITHIUM No results found for: VALPROATE No results found for: CBMZ  Screenings:  PHQ2-9    Flowsheet Row Office Visit from [facility]09/[facility] in [facility] Video Visit from 03/13/2023 in [facility] Video Visit from 01[facility]16/2023 in [facility] Video Visit from 09[facility]12/2022 in [facility] Video Visit from 05/30/2022 in [facility]  PHQ-2 Total Score 4 2 2 2 2   PHQ-9 Total Score 18 5 12 8 5    Flowsheet Row Office Visit from 06/09/2023 in [facility] Video Visit from 03/13/2023 in [facility] Video Visit from 01/16/2023 in [facility]  C-SSRS  RISK CATEGORY High Risk Error: Q3, 4, or 5 should not be populated when Q2 is No Error: Q3, 4, or 5 should not be populated when Q2 is No    Collaboration of Care: Collaboration of Care  Patient/Guardian was advised Release of Information must be obtained prior to any record release in order to collaborate their care with an outside provider. Patient/Guardian was advised if they have not already done so to contact the registration department to sign all necessary forms in order for us  to release information regarding their care.   Consent: Patient/Guardian gives verbal consent for treatment and assignment of benefits for services provided during this visit. Patient/Guardian expressed understanding and agreed to proceed.    Marlo Masson, MD 08/09/2024, 5:25 PM

## 2024-08-09 ENCOUNTER — Encounter (HOSPITAL_COMMUNITY): Payer: Self-pay | Admitting: Student in an Organized Health Care Education/Training Program

## 2024-08-09 ENCOUNTER — Ambulatory Visit (HOSPITAL_BASED_OUTPATIENT_CLINIC_OR_DEPARTMENT_OTHER): Admitting: Student in an Organized Health Care Education/Training Program

## 2024-08-09 VITALS — Ht 67.0 in | Wt 167.4 lb

## 2024-08-09 DIAGNOSIS — F411 Generalized anxiety disorder: Secondary | ICD-10-CM | POA: Diagnosis not present

## 2024-08-09 DIAGNOSIS — G2401 Drug induced subacute dyskinesia: Secondary | ICD-10-CM | POA: Diagnosis not present

## 2024-08-09 DIAGNOSIS — F250 Schizoaffective disorder, bipolar type: Secondary | ICD-10-CM | POA: Diagnosis not present

## 2024-08-09 DIAGNOSIS — Z79899 Other long term (current) drug therapy: Secondary | ICD-10-CM

## 2024-08-09 DIAGNOSIS — G4700 Insomnia, unspecified: Secondary | ICD-10-CM

## 2024-08-09 MED ORDER — ARIPIPRAZOLE 20 MG PO TABS: 20.0000 mg | ORAL_TABLET | Freq: Every day | ORAL | 0 refills | Status: DC

## 2024-08-09 MED ORDER — MIRTAZAPINE 7.5 MG PO TABS: 7.5000 mg | ORAL_TABLET | Freq: Every day | ORAL | 0 refills | Status: DC

## 2024-08-09 MED ORDER — PROPRANOLOL HCL 20 MG PO TABS
20.0000 mg | ORAL_TABLET | Freq: Two times a day (BID) | ORAL | 2 refills | Status: DC
Start: 2024-08-09 — End: 2024-09-15

## 2024-08-09 MED ORDER — INGREZZA 60 MG PO CAPS: 60.0000 mg | ORAL_CAPSULE | Freq: Every day | ORAL | 0 refills | Status: DC

## 2024-08-09 NOTE — Patient Instructions (Addendum)
 [facility] Patient Name: [name] Date of Visit: [08/09/24  Provider Name: Dr. Homer   Dear [name], it was a pleasure seeing you today, and we appreciate the opportunity to care for you.   If you require Family and Medical Leave Act [facility]) paperwork to be completed, please request the necessary forms from your employer's Human Resources (HR) department. You may have the forms faxed directly to our office at [phone], or you may bring the forms in person to your next appointment. Once received, we will complete the required sections and notify you when the paperwork is ready.  Intermittent FMLA may be beneficial if you need to take time off work periodically for medical appointments or treatment related to your condition. This type of leave allows you to be absent from work on an as-needed basis, rather than taking continuous leave, helping you attend scheduled visits without jeopardizing your employment.   Lifestyle Recommendations Sleep Hygiene: Aim for 7-9 hours of quality sleep each night. Establish a regular sleep routine and create a restful environment. Stress Management: Practice stress-reducing techniques such as mindfulness, meditation, deep breathing exercises, or hobbies that you enjoy. Smoking Cessation: If you smoke, consider quitting. We can provide resources and support to help you. Alcohol Consumption: Limit alcohol intake to moderate levels--up to one drink per day for women and up to two drinks per day for men. Routine Health Screenings: Stay up-to-date with routine health screenings and vaccinations as recommended.

## 2024-08-10 ENCOUNTER — Other Ambulatory Visit (HOSPITAL_COMMUNITY): Payer: Self-pay

## 2024-08-10 DIAGNOSIS — Z79899 Other long term (current) drug therapy: Secondary | ICD-10-CM

## 2024-08-10 DIAGNOSIS — G2401 Drug induced subacute dyskinesia: Secondary | ICD-10-CM

## 2024-08-10 MED ORDER — INGREZZA 60 MG PO CAPS
60.0000 mg | ORAL_CAPSULE | Freq: Every day | ORAL | 0 refills | Status: DC
Start: 2024-08-10 — End: 2024-09-15

## 2024-08-16 ENCOUNTER — Encounter: Payer: Self-pay | Admitting: Internal Medicine

## 2024-08-16 ENCOUNTER — Ambulatory Visit: Admitting: Internal Medicine

## 2024-08-16 VITALS — BP 110/68 | HR 72 | Temp 97.0°F | Resp 18 | Ht 67.0 in | Wt 167.2 lb

## 2024-08-16 DIAGNOSIS — E1165 Type 2 diabetes mellitus with hyperglycemia: Secondary | ICD-10-CM

## 2024-08-16 DIAGNOSIS — J0100 Acute maxillary sinusitis, unspecified: Secondary | ICD-10-CM | POA: Diagnosis not present

## 2024-08-16 MED ORDER — AMOXICILLIN-POT CLAVULANATE 875-125 MG PO TABS: 1.0000 | ORAL_TABLET | Freq: Two times a day (BID) | ORAL | 0 refills | Status: DC

## 2024-08-16 MED ORDER — FLUTICASONE PROPIONATE 50 MCG/ACT NA SUSP: 1.0000 | Freq: Two times a day (BID) | NASAL | 2 refills | Status: DC

## 2024-08-16 NOTE — Assessment & Plan Note (Signed)
 We will check a HgBa1c on her today.  Continue her current medications.

## 2024-08-16 NOTE — Assessment & Plan Note (Signed)
 We will start on flonase  and augmentin  at this time.

## 2024-08-16 NOTE — Progress Notes (Signed)
 Office Visit  Subjective   Patient ID: EBELIN DILLEHAY   DOB: 03/29/1971   Age: 53 y.o.   MRN: [id number]   Chief Complaint Chief Complaint  Patient presents with   Follow-up    3 month follow up     History of Present Illness Caprice Lapine is a 53 y.o. female who who returns today for followup of her T2 diabetes.  She was diagnosed with T2 diabetes in 2023 where she was started on medications at that time.  She has been seen in the ED multiple times in the last year with either hyperglycemia or hypoglycemia.   She is currently on metformin  1000mg  BID and Lantus  80 units subcut daily and ozempic  1mg  subcut weekly.  She is also on novolog  sliding scale which she only takes when her blood sugar are >200-300.  She states now that she never has to use sliding scale.  She has been in the ER at [facility] in the past  for hypoglycemia due to her not eating and her sugars going low.  She usually skips breakfast and will eat lunch and dinner or she may skip breakfast and lunch and eat at night.  She was seen in the ER several weeks at [facility] due to her FSBS >400.  They also told her to go get her medicines and be compliant.  She is not walking as much as they would like.. She specifically denies unexplained abdominal pain, nausea or vomiting or diarrhea.  She does check her blood sugars twice a day where she checks them before breakfast and lunch.  Her sugars are now runing 100-200's.  Her last HgBA1c was done 3 months ago and was 7.4%. She has no long term complications of diabetic retinopathy, nephropathy, neuropathy or cardiovascular disease.  She did see Olam Repress on [facility] in 02/2024 but I do not have these results.  She states she is having sinus congestion with green nasal discharge that started about 7 days ago.  She is having chest congestion with cough productive of green sputum.  There is no fevers, chills, myalgias, headaches, SOB, wheezing, nausea, vomiting or diarrhea.  She is  taking OTC nyqyil.       Past Medical History Past Medical History:  Diagnosis Date   Anxiety    Bipolar disorder (HCC)    Bipolar I disorder, most recent episode depressed (HCC) 08/27/2018   Depression      Allergies No Known Allergies   Medications  Current Outpatient Medications:    ARIPiprazole  (ABILIFY ) 20 MG tablet, Take 1 tablet (20 mg total) by mouth daily., Disp: 90 tablet, Rfl: 0   atorvastatin  (LIPITOR) 40 MG tablet, Take 1 tablet (40 mg total) by mouth daily., Disp: 60 tablet, Rfl: 2   Continuous Glucose Sensor (FREESTYLE LIBRE 3 SENSOR) MISC, 78 Units by Does not apply route daily. Place 1 sensor on the skin every 14 days. Use to check glucose continuously, Disp: 2 each, Rfl: 3   glucose blood test strip, Use as instructed, Disp: 100 each, Rfl: 12   INGREZZA  60 MG capsule, Take 1 capsule (60 mg total) by mouth daily., Disp: 90 capsule, Rfl: 0   insulin  glargine (LANTUS  SOLOSTAR) 100 UNIT/ML Solostar Pen, Inject 80 Units into the skin daily., Disp: 70.2 mL, Rfl: 3   Lancets (FREESTYLE) lancets, Use as instructed, Disp: 100 each, Rfl: 12   metFORMIN  (GLUCOPHAGE -XR) 500 MG 24 hr tablet, Take 2 tablets (1,000 mg total) by mouth 2 (  two) times daily with a meal., Disp: 360 tablet, Rfl: 3   mirtazapine  (REMERON ) 7.5 MG tablet, Take 1 tablet (7.5 mg total) by mouth at bedtime., Disp: 90 tablet, Rfl: 0   propranolol  (INDERAL ) 20 MG tablet, Take 1 tablet (20 mg total) by mouth 2 (two) times daily., Disp: 60 tablet, Rfl: 2   Semaglutide , 1 MG/DOSE, (OZEMPIC , 1 MG/DOSE,) 4 MG/3ML SOPN, Inject 1 mg into the skin once a week., Disp: , Rfl:    Semaglutide , 1 MG/DOSE, (OZEMPIC , 1 MG/DOSE,) 4 MG/3ML SOPN, Inject 1 mg into the skin once a week., Disp: 3 mL, Rfl: 5   Semaglutide ,0.25 or 0.5MG /DOS, (OZEMPIC , 0.25 OR 0.5 MG/DOSE,) 2 MG/3ML SOPN, Inject 0.5 mg into the skin once a week., Disp: 3 mL, Rfl: 0   Review of Systems Review of Systems  Constitutional:  Negative for chills, fever  and malaise/fatigue.  Eyes:  Negative for blurred vision and double vision.  Respiratory:  Positive for cough and sputum production. Negative for shortness of breath and wheezing.   Cardiovascular:  Negative for chest pain and leg swelling.  Gastrointestinal:  Negative for abdominal pain, constipation, diarrhea, nausea and vomiting.  Genitourinary:  Negative for frequency.  Musculoskeletal:  Negative for myalgias.  Neurological:  Negative for dizziness, weakness and headaches.  Endo/Heme/Allergies:  Negative for polydipsia.       Objective:    Vitals BP 110/68   Pulse 72   Temp (!) 97 F (36.1 C) (Temporal)   Resp 18   Ht 5' 7 (1.702 m)   Wt 167 lb 3.2 oz (75.8 kg)   SpO2 99%   BMI 26.19 kg/m    Physical Examination Physical Exam Constitutional:      Appearance: Normal appearance. She is not ill-appearing.  Cardiovascular:     Rate and Rhythm: Normal rate and regular rhythm.     Pulses: Normal pulses.     Heart sounds: No murmur heard.    No friction rub. No gallop.  Pulmonary:     Effort: Pulmonary effort is normal. No respiratory distress.     Breath sounds: No wheezing, rhonchi or rales.  Abdominal:     General: Bowel sounds are normal. There is no distension.     Palpations: Abdomen is soft.     Tenderness: There is no abdominal tenderness.  Musculoskeletal:     Right lower leg: No edema.     Left lower leg: No edema.  Skin:    General: Skin is warm and dry.     Findings: No rash.  Neurological:     Mental Status: She is alert.        Assessment & Plan:   Inadequately controlled diabetes mellitus (HCC) We will check a HgBa1c on her today.  Continue her current medications.  Acute non-recurrent maxillary sinusitis We will start on flonase  and augmentin  at this time.    Return in about 3 months (around 11/15/2024).   Selinda Fleeta Finger, MD

## 2024-08-17 LAB — HEMOGLOBIN A1C
Est. average glucose Bld gHb Est-mCnc: 137 mg/dL
Hgb A1c MFr Bld: 6.4 % — ABNORMAL HIGH (ref 4.8–5.6)

## 2024-08-30 ENCOUNTER — Ambulatory Visit: Payer: Self-pay

## 2024-08-30 NOTE — Progress Notes (Signed)
 Patient called.  Patient aware. Tell her that her A1c is normal and controlled.

## 2024-09-14 NOTE — Progress Notes (Unsigned)
 BH MD Outpatient Progress Note  09/15/2024 9:00 PM Kelly Beasley  MRN:  [id number]  Virtual Visit via Telephone Note  I connected with Kelly Beasley on 09/15/24 at  3:00 PM EDT by a video enabled telemedicine application and verified that I am speaking with the correct person using two identifiers.  Location: Patient: Private room at her workplace Provider: Office   I discussed the limitations, risks, security and privacy concerns of performing an evaluation and management service by telephone and the availability of in person appointments. I also discussed with the patient that there may be a patient responsible charge related to this service. The patient expressed understanding and agreed to proceed.   I discussed the assessment and treatment plan with the patient. The patient was provided an opportunity to ask questions and all were answered. The patient agreed with the plan and demonstrated an understanding of the instructions.   The patient was advised to call back or seek an in-person evaluation if the symptoms worsen or if the condition fails to improve as anticipated.   Marlo Masson, MD Psych Resident, PGY-3     Assessment:  Kelly Beasley presents for follow-up evaluation in-person on 09/15/24.  Since the last visit, the patient reports notable improvements in mood, sleep quality, and a reduction in hallucinations. These changes coincide with improved adherence to the current psychotropic regimen, which the patient has managed well. No new symptoms or adverse effects have emerged. Mental status examination today is stable, with no evidence of acute psychosis or mood instability. Given this positive trajectory it is appropriate to continue the current regimen.   Identifying Information: Kelly Beasley is a 53 y.o. female with a history of Schizoaffective disorder, bipolar type, GAD, and TD who is an established patient with [facility] for  management of depression, anxiety, and insomnia .For a comprehensive history and detailed assessment, please refer to the initial adult assessment. She is a former patient of Dr. Rainelle.   The patient's PMHx is significant for T2DM uncontrolled (A1c 7.4 on 04/21/2024).   Plan:  # Schizoaffective Disorder, bipolar type Status of problem: Stable Interventions: Previously discontinued olanzapine  and loxapine  due to adverse effects -- Continue Abilify  20 mg daily  --consider transition to abilify  maintenna --= Continue propranolol  20 mg twice daily for akathisia and anxiety   # Insomnia Status of problem: New to me Interventions: Atarax , Trazodone   --Continue Remeron  7.5 mg nightly -- CBT-i  # GAD Status of problem: Stable Interventions: Patient discontinued Effexor  225 mg daily -- Continue propranolol  20 mg twice daily for anxiety   # Tardive Dyskinesia Past medication trials: Cogentin  Status of problem: Stable Interventions: -- Continue Ingrezza  60 mg daily.   Health maintenance, PCP: Fleeta Valeria Mayo, MD T2DM --  Metformin  1000 mg twice daily, Lantus  80 units subcutaneous daily, Ozempic  1 mg subcutaneous weekly  Patient was given contact information for behavioral health clinic and was instructed to call 911 for emergencies.   Subjective:  Chief Complaint:  Chief Complaint  Patient presents with   Follow-up    Interval History:  Patient reports she is doing well overall. She describes her mood as improved, attributing this to better sleep since starting Remeron . She reports her anxiety has also improved, with panic attacks now occurring 1-2 times per week, which is less frequent than before. She states her sleep has increased to 6-7 hours per night, which is more than previously. She reports eating well and snacking frequently. She describes  improved medication compliance, stating she has established a good routine and is taking her medications as directed. She denies any  adverse effects to medications. She denies recent substance use. She denies suicidal and homicidal ideation.s She reports auditory and visual hallucinations are less intense, describing the voices as quieter and easier to ignore, though she continues to occasionally hear her name called and see things out of the corner of her eye. She was open to the possibility of eventually transitioning to a long-acting injectable.     Visit Diagnosis:    ICD-10-CM   1. Schizoaffective disorder, bipolar type (HCC)  F25.0 ARIPiprazole  (ABILIFY ) 20 MG tablet    2. Tardive dyskinesia  G24.01 INGREZZA  60 MG capsule    propranolol  (INDERAL ) 20 MG tablet    3. Long term current use of antipsychotic medication  Z79.899 INGREZZA  60 MG capsule    4. GAD (generalized anxiety disorder)  F41.1 propranolol  (INDERAL ) 20 MG tablet    5. Insomnia, unspecified type  G47.00 mirtazapine  (REMERON ) 7.5 MG tablet        Past Psychiatric History:  Diagnoses: Schizoaffective disorder-bipolar type  Medication trials: Seroquel , Prozac , Olanzapine , loxapine  (dizziness, heaviness while walking) Previous psychiatrist/therapist: Dr. Brutus Hospitalizations: Denies Suicide attempts: denies SIB: Denies Hx of violence towards others: Denies Current access to guns: Denies Hx of trauma/abuse: Denies Substance use: Denies  Social History   Socioeconomic History   Marital status: Divorced    Spouse name: Not on file   Number of children: Not on file   Years of education: Not on file   Highest education level: Not on file  Occupational History   Not on file  Tobacco Use   Smoking status: Never   Smokeless tobacco: Never  Vaping Use   Vaping status: Never Used  Substance and Sexual Activity   Alcohol use: No   Drug use: No   Sexual activity: Not on file  Other Topics Concern   Not on file  Social History Narrative   Not on file   Social Drivers of Health   Financial Resource Strain: Not on file  Food  Insecurity: Not on file  Transportation Needs: Not on file  Physical Activity: Not on file  Stress: Not on file  Social Connections: Not on file    Allergies: No Known Allergies  Current Medications: Current Outpatient Medications  Medication Sig Dispense Refill   amoxicillin -clavulanate (AUGMENTIN ) 875-125 MG tablet Take 1 tablet by mouth 2 (two) times daily. 20 tablet 0   ARIPiprazole  (ABILIFY ) 20 MG tablet Take 1 tablet (20 mg total) by mouth daily. 90 tablet 0   atorvastatin  (LIPITOR) 40 MG tablet TAKE 1 TABLET BY MOUTH EVERY DAY 90 tablet 1   Continuous Glucose Sensor (FREESTYLE LIBRE 3 SENSOR) MISC 78 Units by Does not apply route daily. Place 1 sensor on the skin every 14 days. Use to check glucose continuously 2 each 3   fluticasone  (FLONASE ) 50 MCG/ACT nasal spray Place 1 spray into both nostrils in the morning and at bedtime. 15.8 mL 2   glucose blood test strip Use as instructed 100 each 12   INGREZZA  60 MG capsule Take 1 capsule (60 mg total) by mouth daily. 90 capsule 0   insulin  glargine (LANTUS  SOLOSTAR) 100 UNIT/ML Solostar Pen Inject 80 Units into the skin daily. 70.2 mL 3   Lancets (FREESTYLE) lancets Use as instructed 100 each 12   metFORMIN  (GLUCOPHAGE -XR) 500 MG 24 hr tablet Take 2 tablets (1,000 mg total) by mouth 2 (  two) times daily with a meal. 360 tablet 3   mirtazapine  (REMERON ) 7.5 MG tablet Take 1 tablet (7.5 mg total) by mouth at bedtime. 90 tablet 0   propranolol  (INDERAL ) 20 MG tablet Take 1 tablet (20 mg total) by mouth 2 (two) times daily. 60 tablet 2   Semaglutide , 1 MG/DOSE, (OZEMPIC , 1 MG/DOSE,) 4 MG/3ML SOPN Inject 1 mg into the skin once a week.     Semaglutide , 1 MG/DOSE, (OZEMPIC , 1 MG/DOSE,) 4 MG/3ML SOPN Inject 1 mg into the skin once a week. 3 mL 5   Semaglutide ,0.25 or 0.5MG /DOS, (OZEMPIC , 0.25 OR 0.5 MG/DOSE,) 2 MG/3ML SOPN Inject 0.5 mg into the skin once a week. 3 mL 0   No current facility-administered medications for this visit.     ROS: Review of Systems  All other systems reviewed and are negative.   Objective:  Objective: Psychiatric Specialty Exam: General Appearance: Casual, fairly groomed  Eye Contact:  Fleeting  Speech:  Clear, coherent, normal rate, spontaneous  Volume:  Normal   Mood:  better  Affect:  brighter affect, smiling  Thought Content: Logical, some lingering Avh improved from prior  Suicidal Thoughts: see subjective  Thought Process: linear, organized  Orientation:  A&Ox4   Memory:  Immediate good  Judgment:  Improved, good  Insight:  Fair  Concentration:  Attention and concentration good   Recall:  unable to formally assess  Fund of Knowledge:  unable to formally assess  Language: Good, fluent  Psychomotor Activity: normal, limited by virtual visit  Akathisia:  NA   AIMS (if indicated): NA   Assets:   Communication Skills Social Support  ADL's:  Intact  Cognition: WNL  Sleep: see above  Appetite: see above    Physical Exam Vitals reviewed.  Constitutional:      General: She is not in acute distress.    Appearance: She is not ill-appearing.  HENT:     Head: Normocephalic and atraumatic.  Eyes:     Extraocular Movements: Extraocular movements intact.     Conjunctiva/sclera: Conjunctivae normal.  Pulmonary:     Effort: Pulmonary effort is normal. No respiratory distress.  Musculoskeletal:        General: Normal range of motion.  Skin:    General: Skin is warm and dry.  Neurological:     General: No focal deficit present.      Metabolic Disorder Labs: Lab Results  Component Value Date   HGBA1C 6.4 (H) 08/16/2024   MPG >398 10/27/2022   No results found for: PROLACTIN Lab Results  Component Value Date   CHOL 195 04/21/2024   TRIG 63 04/21/2024   HDL 75 04/21/2024   CHOLHDL 2.6 04/21/2024   LDLCALC 108 (H) 04/21/2024   LDLCALC 142 (H) 10/08/2023   Lab Results  Component Value Date   TSH 3.590 10/08/2023    Therapeutic Level Labs: No results  found for: LITHIUM No results found for: VALPROATE No results found for: CBMZ  Screenings:  PHQ2-9    Flowsheet Row [facility] Visit [facility] Video Visit from 03/13/2023 in [facility] Video Visit from 01[facility]16/2023 in [facility] Video Visit from 09[facility]12/2022 in [facility] Video Visit from 05/30/2022 in [facility]  PHQ-2 Total Score 4 2 2 2 2   PHQ-9 Total Score 18 5 12 8 5    Flowsheet Row Office Visit from 06/09/2023 in [facility] Video  Visit from [facility]/13/2023 [facility] Video Visit from 01/16/2023 in [facility]  C-SSRS RISK CATEGORY High Risk Error: Q3, 4, or 5 should not be populated when Q2 is No Error: Q3, 4, or 5 should not be populated when Q2 is No    Collaboration of Care: Collaboration of Care  Patient/Guardian was advised Release of Information must be obtained prior to any record release in order to collaborate their care with an outside provider. Patient/Guardian was advised if they have not already done so to contact the registration department to sign all necessary forms in order for us  to release information regarding their care.   Consent: Patient/Guardian gives verbal consent for treatment and assignment of benefits for services provided during this visit. Patient/Guardian expressed understanding and agreed to proceed.    Marlo Masson, MD 09/15/2024, 9:00 PM

## 2024-09-15 ENCOUNTER — Other Ambulatory Visit: Payer: Self-pay | Admitting: Internal Medicine

## 2024-09-15 ENCOUNTER — Telehealth (HOSPITAL_COMMUNITY): Admitting: Student in an Organized Health Care Education/Training Program

## 2024-09-15 DIAGNOSIS — F411 Generalized anxiety disorder: Secondary | ICD-10-CM

## 2024-09-15 DIAGNOSIS — Z79899 Other long term (current) drug therapy: Secondary | ICD-10-CM | POA: Diagnosis not present

## 2024-09-15 DIAGNOSIS — F250 Schizoaffective disorder, bipolar type: Secondary | ICD-10-CM

## 2024-09-15 DIAGNOSIS — G2401 Drug induced subacute dyskinesia: Secondary | ICD-10-CM | POA: Diagnosis not present

## 2024-09-15 DIAGNOSIS — G4700 Insomnia, unspecified: Secondary | ICD-10-CM

## 2024-09-15 MED ORDER — INGREZZA 60 MG PO CAPS: 60.0000 mg | ORAL_CAPSULE | Freq: Every day | ORAL | 0 refills | Status: DC

## 2024-09-15 MED ORDER — ARIPIPRAZOLE 20 MG PO TABS: 20.0000 mg | ORAL_TABLET | Freq: Every day | ORAL | 0 refills | Status: DC

## 2024-09-15 MED ORDER — PROPRANOLOL HCL 20 MG PO TABS: 20.0000 mg | ORAL_TABLET | Freq: Two times a day (BID) | ORAL | 2 refills | Status: DC

## 2024-09-15 MED ORDER — MIRTAZAPINE 7.5 MG PO TABS: 7.5000 mg | ORAL_TABLET | Freq: Every day | ORAL | 0 refills | Status: DC

## 2024-09-20 ENCOUNTER — Telehealth (HOSPITAL_COMMUNITY): Payer: Self-pay | Admitting: Student in an Organized Health Care Education/Training Program

## 2024-09-20 DIAGNOSIS — G2401 Drug induced subacute dyskinesia: Secondary | ICD-10-CM

## 2024-09-20 DIAGNOSIS — Z79899 Other long term (current) drug therapy: Secondary | ICD-10-CM

## 2024-09-20 MED ORDER — INGREZZA 60 MG PO CAPS: 60.0000 mg | ORAL_CAPSULE | Freq: Every day | ORAL | 0 refills | Status: DC

## 2024-09-20 MED ORDER — INGREZZA 60 MG PO CAPS
60.0000 mg | ORAL_CAPSULE | Freq: Every day | ORAL | 0 refills | Status: DC
Start: 2024-09-20 — End: 2024-09-20

## 2024-09-20 NOTE — Telephone Encounter (Signed)
 Received fax from [facility] requesting pt's prescription of Ingrezza  be sent to [facility].   Contacted patient at her mobile number to inform her the prescription has been sent to [place] and provided her with their contact information should any questions come up.  Analina Filla Carrin Carrero, MD PGY-3, [facility] Psychiat[facility]

## 2024-10-13 ENCOUNTER — Encounter: Payer: Self-pay | Admitting: Internal Medicine

## 2024-10-13 ENCOUNTER — Ambulatory Visit: Admitting: Internal Medicine

## 2024-10-13 VITALS — BP 104/54 | HR 75 | Temp 97.1°F | Resp 16 | Ht 67.0 in | Wt 165.8 lb

## 2024-10-13 DIAGNOSIS — E119 Type 2 diabetes mellitus without complications: Secondary | ICD-10-CM

## 2024-10-13 NOTE — Assessment & Plan Note (Signed)
 Her diabetes is controlled with her A1c but she has missed work 2-3 times per month over the last year due to hyperglycemia or hypoglycemia.  She will miss her meds which can make her hyper and she can forget to eat which can make her hypoglycemic.  I asked her to take her meds as written and make sure she eats 3 meals per day.  I did sign FMLA paperwork for her today.

## 2024-10-13 NOTE — Progress Notes (Signed)
 Office Visit  Subjective   Patient ID: Kelly Beasley   DOB: 1971/09/10   Age: 53 y.o.   MRN: [id number]   Chief Complaint Chief Complaint  Patient presents with   Acute Visit    Pt in today for FMLA paperwork.     History of Present Illness Kelly Beasley is a 53 y.o. female who who returns today for followup of her T2 diabetes and to fill FMLA paperwork for her job at [employer].  She works as a LAWYER at [employer] and tells me that she has had problems with her diabetes where she was missing 2-3 days per month at [facility] due to her blood sugars being either too high or too low where she was sent home because she was symptomatic.  SABRA  She was diagnosed with T2 diabetes in 2023 where she was started on medications at that time.  She has been seen in the ED multiple times in the last year with either hyperglycemia or hypoglycemia.   She is currently on metformin  1000mg  BID and Lantus  80 units subcut daily and ozempic  1mg  subcut weekly.  She is also on novolog  sliding scale which she only takes when her blood sugar are >200-300.  She states now that she never has to use sliding scale with last use was last month.  She has been in the ER at [facility] in the past  for hypoglycemia due to her not eating and her sugars going low.  She usually skips breakfast and will eat lunch and dinner or she may skip breakfast and lunch and eat at night.  She was seen in the ER several weeks at [facility] due to her FSBS >400.  They also told her to go get her medicines and be compliant.  She is not walking as much as they would like.. She specifically denies unexplained abdominal pain, nausea or vomiting or diarrhea.  She does check her blood sugars twice a day where she checks them before breakfast and lunch.  Her sugars are now runing 100-200's.  She states they can get down to 60-70's if she does not eat.  Her last HgBA1c was done on 08/16/2024 and was 6.4%.  Her HgBA1c in 2023 was >15.5% and on followup her HgBA1c went on 09/2023 to 6%, then  on 03/2024 to 7.4%. She has no long term complications of diabetic retinopathy, nephropathy, neuropathy or cardiovascular disease.  She did see Olam Repress on [facility] in 02/2024 but I do not have these results.     Past Medical History Past Medical History:  Diagnosis Date   Anxiety    Bipolar disorder (HCC)    Bipolar I disorder, most recent episode depressed (HCC) 08/27/2018   Depression      Allergies No Known Allergies   Medications  Current Outpatient Medications:    ARIPiprazole  (ABILIFY ) 20 MG tablet, Take 1 tablet (20 mg total) by mouth daily., Disp: 90 tablet, Rfl: 0   atorvastatin  (LIPITOR) 40 MG tablet, TAKE 1 TABLET BY MOUTH EVERY DAY, Disp: 90 tablet, Rfl: 1   Continuous Glucose Sensor (FREESTYLE LIBRE 3 SENSOR) MISC, 78 Units by Does not apply route daily. Place 1 sensor on the skin every 14 days. Use to check glucose continuously, Disp: 2 each, Rfl: 3   fluticasone  (FLONASE ) 50 MCG/ACT nasal spray, Place 1 spray into both nostrils in the morning and at bedtime., Disp: 15.8 mL, Rfl: 2   glucose blood test strip, Use as instructed, Disp:  100 each, Rfl: 12   INGREZZA  60 MG capsule, Take 1 capsule (60 mg total) by mouth daily., Disp: 90 capsule, Rfl: 0   insulin  glargine (LANTUS  SOLOSTAR) 100 UNIT/ML Solostar Pen, Inject 80 Units into the skin daily., Disp: 70.2 mL, Rfl: 3   Lancets (FREESTYLE) lancets, Use as instructed, Disp: 100 each, Rfl: 12   metFORMIN  (GLUCOPHAGE -XR) 500 MG 24 hr tablet, Take 2 tablets (1,000 mg total) by mouth 2 (two) times daily with a meal., Disp: 360 tablet, Rfl: 3   mirtazapine  (REMERON ) 7.5 MG tablet, Take 1 tablet (7.5 mg total) by mouth at bedtime., Disp: 90 tablet, Rfl: 0   propranolol  (INDERAL ) 20 MG tablet, Take 1 tablet (20 mg total) by mouth 2 (two) times daily., Disp: 60 tablet, Rfl: 2   Semaglutide , 1 MG/DOSE, (OZEMPIC , 1 MG/DOSE,) 4 MG/3ML SOPN, Inject 1 mg into the skin once a week., Disp: 3 mL, Rfl: 5   Review of  Systems Review of Systems  Constitutional:  Negative for chills and fever.  Eyes:  Negative for blurred vision and double vision.  Respiratory:  Negative for cough and shortness of breath.   Cardiovascular:  Negative for chest pain, palpitations and leg swelling.  Gastrointestinal:  Negative for abdominal pain, constipation, diarrhea, nausea and vomiting.  Genitourinary:  Negative for frequency.  Musculoskeletal:  Negative for myalgias.  Skin:  Negative for itching and rash.  Neurological:  Positive for weakness. Negative for dizziness and headaches.  Endo/Heme/Allergies:  Negative for polydipsia.       Objective:    Vitals BP (!) 104/54   Pulse 75   Temp (!) 97.1 F (36.2 C) (Temporal)   Resp 16   Ht 5' 7 (1.702 m)   Wt 165 lb 12.8 oz (75.2 kg)   SpO2 97%   BMI 25.97 kg/m    Physical Examination Physical Exam Constitutional:      Appearance: Normal appearance. She is not ill-appearing.  Cardiovascular:     Rate and Rhythm: Normal rate and regular rhythm.     Pulses: Normal pulses.     Heart sounds: No murmur heard.    No friction rub. No gallop.  Pulmonary:     Effort: Pulmonary effort is normal. No respiratory distress.     Breath sounds: No wheezing, rhonchi or rales.  Abdominal:     General: Bowel sounds are normal. There is no distension.     Palpations: Abdomen is soft.     Tenderness: There is no abdominal tenderness.  Musculoskeletal:     Right lower leg: No edema.     Left lower leg: No edema.  Skin:    General: Skin is warm and dry.     Findings: No rash.  Neurological:     Mental Status: She is alert.        Assessment & Plan:   Diabetes mellitus without complication (HCC) Her diabetes is controlled with her A1c but she has missed work 2-3 times per month over the last year due to hyperglycemia or hypoglycemia.  She will miss her meds which can make her hyper and she can forget to eat which can make her hypoglycemic.  I asked her to take her  meds as written and make sure she eats 3 meals per day.  I did sign FMLA paperwork for her today.    No follow-ups on file.   Selinda Fleeta Finger, MD

## 2024-11-08 ENCOUNTER — Ambulatory Visit: Admitting: Internal Medicine

## 2024-11-08 ENCOUNTER — Ambulatory Visit (HOSPITAL_COMMUNITY): Admitting: Student in an Organized Health Care Education/Training Program

## 2024-11-08 ENCOUNTER — Encounter: Payer: Self-pay | Admitting: Internal Medicine

## 2024-11-08 VITALS — BP 122/70 | HR 66 | Temp 97.5°F | Resp 18 | Ht 67.0 in | Wt 167.1 lb

## 2024-11-08 DIAGNOSIS — F411 Generalized anxiety disorder: Secondary | ICD-10-CM

## 2024-11-08 DIAGNOSIS — G4700 Insomnia, unspecified: Secondary | ICD-10-CM

## 2024-11-08 DIAGNOSIS — E1169 Type 2 diabetes mellitus with other specified complication: Secondary | ICD-10-CM | POA: Insufficient documentation

## 2024-11-08 DIAGNOSIS — F250 Schizoaffective disorder, bipolar type: Secondary | ICD-10-CM

## 2024-11-08 DIAGNOSIS — G2401 Drug induced subacute dyskinesia: Secondary | ICD-10-CM

## 2024-11-08 DIAGNOSIS — Z79899 Other long term (current) drug therapy: Secondary | ICD-10-CM

## 2024-11-08 MED ORDER — OZEMPIC (1 MG/DOSE) 4 MG/3ML ~~LOC~~ SOPN: 1.0000 mg | PEN_INJECTOR | SUBCUTANEOUS | 5 refills | Status: AC

## 2024-11-08 MED ORDER — DEXCOM G7 SENSOR MISC: 11 refills | Status: AC

## 2024-11-08 MED ORDER — MIRTAZAPINE 7.5 MG PO TABS: 7.5000 mg | ORAL_TABLET | Freq: Every day | ORAL | 0 refills | Status: DC

## 2024-11-08 MED ORDER — LAMOTRIGINE 100 MG PO TABS: 100.0000 mg | ORAL_TABLET | Freq: Every day | ORAL | 0 refills | Status: AC

## 2024-11-08 MED ORDER — INGREZZA 60 MG PO CAPS: 60.0000 mg | ORAL_CAPSULE | Freq: Every day | ORAL | 0 refills | Status: DC

## 2024-11-08 MED ORDER — LAMOTRIGINE 25 MG PO TABS: 25.0000 mg | ORAL_TABLET | Freq: Every day | ORAL | 0 refills | Status: AC

## 2024-11-08 MED ORDER — ARIPIPRAZOLE 20 MG PO TABS: 20.0000 mg | ORAL_TABLET | Freq: Every day | ORAL | 0 refills | Status: DC

## 2024-11-08 MED ORDER — PROPRANOLOL HCL 20 MG PO TABS: 20.0000 mg | ORAL_TABLET | Freq: Two times a day (BID) | ORAL | 2 refills | Status: DC

## 2024-11-08 NOTE — Patient Instructions (Addendum)
 [facility] Patient Name: Kelly Beasley Date of Visit: [11/08/2024  Provider Name: Dr. Homer   Dear [name], it was a pleasure seeing you today, and we appreciate the opportunity to care for you.    These are the changes we have made to your medications:  --Start taking lamotrigine  25 mg (1 tablet) for 14 days, then increase to 50 mg (2 tablets) for 14 days, then transition to 100 mg daily until follow-up     Lifestyle Recommendations Sleep Hygiene: Aim for 7-9 hours of quality sleep each night. Establish a regular sleep routine and create a restful environment. Stress Management: Practice stress-reducing techniques such as mindfulness, meditation, deep breathing exercises, or hobbies that you enjoy. Smoking Cessation: If you smoke, consider quitting. We can provide resources and support to help you. Alcohol Consumption: Limit alcohol intake to moderate levels--up to one drink per day for women and up to two drinks per day for men. Routine Health Screenings: Stay up-to-date with routine health screenings and vaccinations as recommended.

## 2024-11-08 NOTE — Progress Notes (Unsigned)
 BH MD Outpatient Progress Note  11/10/2024 6:43 AM Kelly Beasley  MRN:  [id number]   Assessment:  Kelly Beasley presents for follow-up evaluation in-person on 11/10/2024.  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 Her main concern at this visit is ongoing low mood and anxiety without clear improvement from the last visit.  She endorses persistent auditory and visual hallucinations, overall unchanged, and currently at a level she feels is manageable and does not significantly impair functioning.  Patient was amenable to starting Lamictal  after risks and benefits discussion, to better manage mood instability if she is not a candidate for SSRIs given her bipolar diagnosis.  Education was reinforced regarding the impact of sustained overwork on mood and psychotic symptoms   Identifying Information: Kelly Beasley is a 53 y.o. female with a history of Schizoaffective disorder, bipolar type, GAD, and TD who is an established patient with [facility] for management of depression, anxiety, and insomnia .For a comprehensive history and detailed assessment, please refer to the initial adult assessment. She is a former patient of Dr. Rainelle.   The patient's PMHx is significant for T2DM uncontrolled (A1c 6.4 on 08/16/2024).   Plan:  # Schizoaffective Disorder, bipolar type Status of problem: Chronic, unchanged Interventions: Previously discontinued olanzapine  and loxapine  due to adverse effects -- Continue Abilify  20 mg daily --Continue propranolol  20 mg twice daily for akathisia and anxiety -- Start Lamictal  titration for improved mood stability, schedule: 25 mg daily for 2 weeks, then increase to 50 mg daily for 2 weeks, then increase to 100 mg daily until follow-up appointment   # Insomnia Status of problem: Stable Interventions: Atarax , Trazodone   -- Continue Remeron  7.5 mg nightly   # GAD Status  of problem: Stable Interventions: Patient discontinued Effexor  225 mg daily -- Continue propranolol  20 mg twice daily for anxiety   # Tardive Dyskinesia Past medication trials: Cogentin  Status of problem: Stable Interventions: -- Continue Ingrezza  60 mg daily.   Health maintenance, PCP: Fleeta Valeria Mayo, MD T2DM --  Metformin  1000 mg twice daily, Lantus  80 units subcutaneous daily, Ozempic  1 mg subcutaneous weekly  Patient was given contact information for behavioral health clinic and was instructed to call 911 for emergencies.   Subjective:  Chief Complaint:  Chief Complaint  Patient presents with   Follow-up    Interval History:  The patient reports she has continued working seven days per week. Since the last visit, depressive symptoms remain present and unchanged, and anxiety symptoms are also unchanged. She denies adverse effects from medications and reports full adherence, taking medications as prescribed. She notes some difficulty staying still, without further elaboration.  She reports she continues to sleep well with Remeron , now averaging 6-7 hours nightly. Appetite was not specifically endorsed as problematic. She denies caffeine use and denies any recent substance use. She denies suicidal or homicidal ideation.  She continues to experience auditory and visual perceptual disturbances, describing seeing things out of the corner of her eye and hearing voices; these symptoms are persistent but manageable, and she reports she is able to ignore them.     Visit Diagnosis:    ICD-10-CM   1. Tardive dyskinesia  G24.01 INGREZZA  60 MG capsule    propranolol  (INDERAL ) 20 MG tablet    2. Long term current use of antipsychotic medication  Z79.899 INGREZZA  60 MG capsule    3. Schizoaffective disorder, bipolar type (HCC)  F25.0 lamoTRIgine  (  LAMICTAL ) 25 MG tablet    lamoTRIgine  (LAMICTAL ) 100 MG tablet    ARIPiprazole  (ABILIFY ) 20 MG tablet    4. GAD (generalized anxiety  disorder)  F41.1 propranolol  (INDERAL ) 20 MG tablet    5. Insomnia, unspecified type  G47.00 mirtazapine  (REMERON ) 7.5 MG tablet         Past Psychiatric History:  Diagnoses: Schizoaffective disorder-bipolar type  Medication trials: Seroquel , Prozac , Olanzapine , loxapine  (dizziness, heaviness while walking) Previous psychiatrist/therapist: Dr. Brutus Hospitalizations: Denies Suicide attempts: denies SIB: Denies Hx of violence towards others: Denies Current access to guns: Denies Hx of trauma/abuse: Denies Substance use: Denies  Social History   Socioeconomic History   Marital status: Divorced    Spouse name: Not on file   Number of children: Not on file   Years of education: Not on file   Highest education level: Not on file  Occupational History   Not on file  Tobacco Use   Smoking status: Never   Smokeless tobacco: Never  Vaping Use   Vaping status: Never Used  Substance and Sexual Activity   Alcohol use: No   Drug use: No   Sexual activity: Not on file  Other Topics Concern   Not on file  Social History Narrative   Not on file   Social Drivers of Health   Tobacco Use: Low Risk (11/08/2024)   Patient History    Smoking Tobacco Use: Never    Smokeless Tobacco Use: Never    Passive Exposure: Not on file  Financial Resource Strain: Not on file  Food Insecurity: Not on file  Transportation Needs: Not on file  Physical Activity: Not on file  Stress: Not on file  Social Connections: Not on file  Depression (PHQ2-9): High Risk (06/09/2023)   Depression (PHQ2-9)    PHQ-2 Score: 18  Alcohol Screen: Not on file  Housing: Unknown (12/27/2023)   Received from [facility]   Epic    Unable to Pay for Housing in the Last Year: Not on file    Number of Times Moved in the Last Year: Not on file    At any time in the past 12 months, were you homeless or living in a shelter (including now)?: No  Utilities: Not on file  Health Literacy: Not on file     Allergies: No Known Allergies  Current Medications: Current Outpatient Medications  Medication Sig Dispense Refill   lamoTRIgine  (LAMICTAL ) 100 MG tablet Take 1 tablet (100 mg total) by mouth daily. 60 tablet 0   lamoTRIgine  (LAMICTAL ) 25 MG tablet Take 1 tablet (25 mg total) by mouth daily. Start taking lamotrigine  25 mg (1 tablet) for 14 days, then increase to 50 mg (2 tablets) for 14 days 42 tablet 0   ARIPiprazole  (ABILIFY ) 20 MG tablet Take 1 tablet (20 mg total) by mouth daily. 90 tablet 0   atorvastatin  (LIPITOR) 40 MG tablet TAKE 1 TABLET BY MOUTH EVERY DAY 90 tablet 1   Continuous Glucose Sensor (DEXCOM G7 SENSOR) MISC Apply new sensor every 10 days for Type 2 Diabetes E11.65 3 each 11   fluticasone  (FLONASE ) 50 MCG/ACT nasal spray Place 1 spray into both nostrils in the morning and at bedtime. 15.8 mL 2   glucose blood test strip Use as instructed 100 each 12   INGREZZA  60 MG capsule Take 1 capsule (60 mg total) by mouth daily. 90 capsule 0   insulin  glargine (LANTUS ) 100 UNIT/ML injection Inject 70 Units into the skin daily.  Lancets (FREESTYLE) lancets Use as instructed 100 each 12   metFORMIN  (GLUCOPHAGE -XR) 500 MG 24 hr tablet Take 2 tablets (1,000 mg total) by mouth 2 (two) times daily with a meal. 360 tablet 3   mirtazapine  (REMERON ) 7.5 MG tablet Take 1 tablet (7.5 mg total) by mouth at bedtime. 90 tablet 0   propranolol  (INDERAL ) 20 MG tablet Take 1 tablet (20 mg total) by mouth 2 (two) times daily. 60 tablet 2   Semaglutide , 1 MG/DOSE, (OZEMPIC , 1 MG/DOSE,) 4 MG/3ML SOPN Inject 1 mg into the skin once a week. 3 mL 5   No current facility-administered medications for this visit.    ROS: Review of Systems  All other systems reviewed and are negative.   Objective:  Objective: Psychiatric Specialty Exam: General Appearance: Casual, fairly groomed  Eye Contact:  Fleeting  Speech:  Clear, coherent, normal rate, spontaneous  Volume:  Normal   Mood:  Still  sad  Affect: Euthymic; congruent with stated mood, full range  Thought Content: Logical, chronic AVH unchanged from last visit  Suicidal Thoughts: see subjective  Thought Process: linear, organized  Orientation:  A&Ox4   Memory:  Immediate good  Judgment:  Improved, good  Insight:  Fair  Concentration:  Attention and concentration good   Recall:  unable to formally assess  Fund of Knowledge:  unable to formally assess  Language: Good, fluent  Psychomotor Activity: normal, limited by virtual visit  Akathisia:  NA   AIMS (if indicated): NA   Assets:   Communication Skills Social Support  ADL's:  Intact  Cognition: WNL  Sleep: see above  Appetite: see above    Physical Exam Vitals reviewed.  Constitutional:      General: She is not in acute distress.    Appearance: She is not ill-appearing.  HENT:     Head: Normocephalic and atraumatic.  Eyes:     Extraocular Movements: Extraocular movements intact.     Conjunctiva/sclera: Conjunctivae normal.  Pulmonary:     Effort: Pulmonary effort is normal. No respiratory distress.  Musculoskeletal:        General: Normal range of motion.  Skin:    General: Skin is warm and dry.  Neurological:     General: No focal deficit present.      Metabolic Disorder Labs: Lab Results  Component Value Date   HGBA1C 6.4 (H) 08/16/2024   MPG >398 10/27/2022   No results found for: PROLACTIN Lab Results  Component Value Date   CHOL 195 04/21/2024   TRIG 63 04/21/2024   HDL 75 04/21/2024   CHOLHDL 2.6 04/21/2024   LDLCALC 108 (H) 04/21/2024   LDLCALC 142 (H) 10/08/2023   Lab Results  Component Value Date   TSH 3.590 10/08/2023    Therapeutic Level Labs: No results found for: LITHIUM No results found for: VALPROATE No results found for: CBMZ  Screenings:  AIMS    Flowsheet Row Office Visit [facility] 11/08/20[facility] in [facility]  AIMS Total Score 0   PHQ2-9    Flowsheet Row Office  Visit from 06/09/2023 in [facility] Video Visit from 03/13/2023 in [facility] Video Visit from 01[facility]16/2023 in [facility] Video Visit from 09[facility]12/2022 in [facility] Video Visit from 05[facility]30/2022 in [facility]  PHQ-2 Total Score 4 2 2 2 2   PHQ-9 Total Score 18 5 12 8 5    Flowsheet Row Office Visit from 06/09/2023  in [facility] Video Visit from 03/13/2023 in [facility] Video Visit from 01/16/2023 in [facility]  C-SSRS RISK CATEGORY High Risk Error: Q3, 4, or 5 should not be populated when Q2 is No Error: Q3, 4, or 5 should not be populated when Q2 is No    Collaboration of Care: Collaboration of Care  Patient/Guardian was advised Release of Information must be obtained prior to any record release in order to collaborate their care with an outside provider. Patient/Guardian was advised if they have not already done so to contact the registration department to sign all necessary forms in order for us  to release information regarding their care.   Consent: Patient/Guardian gives verbal consent for treatment and assignment of benefits for services provided during this visit. Patient/Guardian expressed understanding and agreed to proceed.    Marlo Masson, MD 11/10/2024, 6:43 AM

## 2024-11-08 NOTE — Progress Notes (Unsigned)
° °  Office Visit  Subjective   Patient ID: TALULA ISLAND   DOB: Aug 19, 1971   Age: 53 y.o.   MRN: [id number]   Chief Complaint Chief Complaint  Patient presents with   Follow-up    3 month     History of Present Illness 53 years old female with history of diabetes and hyperlipidemia is here for follow up. She takes lantus  80 units daily and ozympic 1 mg weekly and her sugar has dropped low few times.   Past Medical History Past Medical History:  Diagnosis Date   Anxiety    Bipolar disorder (HCC)    Bipolar I disorder, most recent episode depressed (HCC) 08/27/2018   Depression      Allergies Allergies[1]   Review of Systems ROS     Objective:    Vitals BP 122/70   Pulse 66   Temp (!) 97.5 F (36.4 C)   Resp 18   Ht 5' 7 (1.702 m)   Wt 167 lb 2 oz (75.8 kg)   SpO2 99%   BMI 26.18 kg/m    Physical Examination Physical Exam     Assessment & Plan:   No problem-specific Assessment & Plan notes found for this encounter.    Return in about 3 months (around 02/06/2025).   Roetta Dare, MD      [1] No Known Allergies

## 2024-11-09 NOTE — Assessment & Plan Note (Signed)
 She will continue with current medication and will follow with psychiatrist.

## 2024-11-09 NOTE — Assessment & Plan Note (Addendum)
 Her blood sugar is well controlled and because of hypoglycemia I have suggested to decrease the dose of Lantus  to 70 units daily.  I will also send her Dexcom G7 so she can avoid hypoglycemia.  I will do lipid panel and CMP today.

## 2024-11-10 ENCOUNTER — Other Ambulatory Visit: Payer: Self-pay | Admitting: Internal Medicine

## 2024-11-10 ENCOUNTER — Ambulatory Visit: Admitting: Internal Medicine

## 2024-11-15 ENCOUNTER — Ambulatory Visit: Admitting: Internal Medicine

## 2024-12-06 ENCOUNTER — Other Ambulatory Visit (HOSPITAL_COMMUNITY): Payer: Self-pay | Admitting: Student in an Organized Health Care Education/Training Program

## 2024-12-06 DIAGNOSIS — F250 Schizoaffective disorder, bipolar type: Secondary | ICD-10-CM

## 2024-12-14 ENCOUNTER — Other Ambulatory Visit (HOSPITAL_COMMUNITY): Payer: Self-pay | Admitting: Student in an Organized Health Care Education/Training Program

## 2024-12-14 DIAGNOSIS — F250 Schizoaffective disorder, bipolar type: Secondary | ICD-10-CM

## 2024-12-16 ENCOUNTER — Other Ambulatory Visit: Payer: Self-pay

## 2024-12-16 MED ORDER — GLUCOSE BLOOD VI STRP: ORAL_STRIP | 12 refills | Status: DC

## 2024-12-16 MED ORDER — GLUCOSE BLOOD VI STRP: ORAL_STRIP | 12 refills | Status: AC

## 2024-12-20 ENCOUNTER — Telehealth (HOSPITAL_COMMUNITY): Admitting: Student in an Organized Health Care Education/Training Program

## 2024-12-20 DIAGNOSIS — Z79899 Other long term (current) drug therapy: Secondary | ICD-10-CM | POA: Diagnosis not present

## 2024-12-20 DIAGNOSIS — F411 Generalized anxiety disorder: Secondary | ICD-10-CM

## 2024-12-20 DIAGNOSIS — G4700 Insomnia, unspecified: Secondary | ICD-10-CM | POA: Diagnosis not present

## 2024-12-20 DIAGNOSIS — G2401 Drug induced subacute dyskinesia: Secondary | ICD-10-CM | POA: Diagnosis not present

## 2024-12-20 DIAGNOSIS — F250 Schizoaffective disorder, bipolar type: Secondary | ICD-10-CM | POA: Diagnosis not present

## 2024-12-20 MED ORDER — LAMOTRIGINE 25 MG PO TABS: 50.0000 mg | ORAL_TABLET | Freq: Every day | ORAL | 0 refills | Status: AC

## 2024-12-20 MED ORDER — LURASIDONE HCL 80 MG PO TABS: 80.0000 mg | ORAL_TABLET | Freq: Every day | ORAL | 0 refills | Status: AC

## 2024-12-20 MED ORDER — INGREZZA 60 MG PO CAPS: 60.0000 mg | ORAL_CAPSULE | Freq: Every day | ORAL | 0 refills | Status: AC

## 2024-12-20 MED ORDER — PROPRANOLOL HCL 20 MG PO TABS: 20.0000 mg | ORAL_TABLET | Freq: Two times a day (BID) | ORAL | 2 refills | Status: AC

## 2024-12-20 MED ORDER — ARIPIPRAZOLE 10 MG PO TABS: 10.0000 mg | ORAL_TABLET | Freq: Every day | ORAL | 0 refills | Status: DC

## 2024-12-20 MED ORDER — MIRTAZAPINE 7.5 MG PO TABS: 7.5000 mg | ORAL_TABLET | Freq: Every day | ORAL | 0 refills | Status: AC

## 2024-12-20 MED ORDER — LAMOTRIGINE 25 MG PO TABS: 25.0000 mg | ORAL_TABLET | Freq: Every day | ORAL | 0 refills | Status: AC

## 2024-12-20 MED ORDER — ARIPIPRAZOLE 20 MG PO TABS: 20.0000 mg | ORAL_TABLET | Freq: Every day | ORAL | 0 refills | Status: DC

## 2024-12-20 MED ORDER — LURASIDONE HCL 40 MG PO TABS: 40.0000 mg | ORAL_TABLET | Freq: Every day | ORAL | 0 refills | Status: AC

## 2024-12-20 MED ORDER — LAMOTRIGINE 100 MG PO TABS: 100.0000 mg | ORAL_TABLET | Freq: Every day | ORAL | 2 refills | Status: AC

## 2024-12-20 NOTE — Patient Instructions (Signed)
 [facility] Patient Name: Kelly Beasley Date of Visit: [12/20/24  Provider Name: Dr. Homer   Dear [name], it was a pleasure seeing you today, and we appreciate the opportunity to care for you.    These are the changes we have made to your medications:  We are restarting your Lamictal  since you missed a week of the medication Please take Lamictal  25 mg (1 tablet) daily for 2 weeks THEN Increase Lamictal  to 50 mg (2 tablets) daily for 2 weeks THEN Increase Lamictal  to 100 mg daily until follow-up Due to the increased restlessness you described experiencing while on Abilify , we are changing you to a different antipsychotic Latuda .  Please follow the instructions below: Decrease the Abilify  from 20 mg to 10 mg daily for 1 week then discontinue.  During this time also start taking Latuda  40 mg daily for 1 week After 1 week, increase your Latuda  from 40 mg to 80 mg daily until follow-up     ________________________________________________________  Difficulties with sleep?   Can also use this free app for insomnia called CBT-I. Let your doctors and therapists know so they can help with extra tips and tricks or for guidance and accountability. NO ADDS on the app.     ________________________________________________________  Non-Emergent / Urgent  [facility] [address], SECOND FLOOR [place], [place] [address] [phone] OUTPATIENT Walk-in information: Please note, all walk-ins are first come & first serve, with limited number of availability.  Please note that to be eligible for services you must bring: ID or a piece of mail with your name [facility] address  Therapist for therapy:  Monday & Wednesdays: Please ARRIVE at 7:15 AM for registration Will START at 8:00 AM Every 1st & 2nd Friday of the month: Please ARRIVE at 10:15 AM for registration Will START at 1 PM - 5 PM  Psychiatrist for  medication management: Monday - Friday:  Please ARRIVE at 7:15 AM for registration Will START at 8:00 AM  Regretfully, due to limited availability, please be aware that you may not been seen on the same day as walk-in. Please consider making an appoint or try again. Thank you for your patience and understanding.

## 2024-12-20 NOTE — Progress Notes (Signed)
 [facility] MD Outpatient Progress Note  12/20/2024 5:24 PM Delaila SAMI ROES  MRN:  [id number]   Virtual Visit via Video Note  I connected with Kelly Beasley on 12/20/24 at  3:30 PM EST by a video enabled telemedicine application and verified that I am speaking with the correct person using two identifiers.  Location: Patient: At work, in a secure, private environment Provider: Office   I discussed the limitations, risks, security and privacy concerns of performing an evaluation and management service by telephone and the availability of in person appointments. I also discussed with the patient that there may be a patient responsible charge related to this service. The patient expressed understanding and agreed to proceed.   I discussed the assessment and treatment plan with the patient. The patient was provided an opportunity to ask questions and all were answered. The patient agreed with the plan and demonstrated an understanding of the instructions.   The patient was advised to call back or seek an in-person evaluation if the symptoms worsen or if the condition fails to improve as anticipated.   Marlo Masson, MD Psych Resident, PGY-3    Assessment:  Kelly Beasley presents for follow-up evaluation in-person on 12/20/24.   Since the last visit the patient did not proceed with planned titration of Lamictal , but was agreeable to restarting a structured titration with a target dose of 100 mg daily.  Clear, stepwise instructions were reviewed and provided in the AVS.  In addition to this the patient also describes worsening restlessness no longer relieved by propranolol  which was concerning for Abilify  induced akathisia.  Given persistence of symptoms, a cross-taper from Abilify  to Latuda  has been initiated, with detailed instructions provided in the AVS as well.  Will reassess response and tolerability at follow-up in approximately 1 month.   Identifying Information: Kelly Beasley is a 54 y.o. female with a history of Schizoaffective disorder, bipolar type, GAD, and TD who is an established patient with [facility] for management of depression, anxiety, and insomnia .For a comprehensive history and detailed assessment, please refer to the initial adult assessment. She is a former patient of Dr. Rainelle.   The patient's PMHx is significant for T2DM uncontrolled (A1c 6.4 on 08/16/2024).   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, unchanged Interventions: Previously discontinued olanzapine  and loxapine  due to adverse effects -- Tapering Abilify  20 mg to 10 mg daily for 1 week then STOP due akathisia -- Start Latuda  40 mg daily for 1 week, then increase to 80 mg daily --Continue propranolol  20 mg twice daily for akathisia and anxiety -- Restart Lamictal  titration for improved mood stability, schedule: 25 mg daily for 2 weeks, then increase to 50 mg daily for 2 weeks, then increase to 100 mg daily until follow-up appointment   # Insomnia Status of problem: Stable Interventions: Atarax , Trazodone   -- Continue Remeron  7.5 mg nightly  # GAD Status of problem: Stable Interventions: effexor  -- Continue propranolol  20 mg twice daily for anxiety   # Tardive Dyskinesia Past medication trials: Cogentin  Status of problem: Stable Interventions: -- Continue Ingrezza  60 mg daily.   Health maintenance, PCP: Fleeta Valeria Mayo, MD T2DM --  Metformin  1000 mg twice daily, Lantus  80 units subcutaneous daily, Ozempic  1 mg subcutaneous weekly  Patient was given contact information for behavioral health clinic and was instructed to call 911 for emergencies.   Subjective:  Chief Complaint:  Chief Complaint  Patient  presents with   Follow-up    Interval History:   Patient reports misunderstanding the Lamictal  titration schedule and running out of her 25 mg tablets, and she shares she not  taking Lamictal  for the past week but she is amenable to restarting and titrating the medication as directed. Patient reports worsening restlessness that no longer resolves with propranolol , and she describes difficulty pacing at work with an inability to remain still. Patient reports overall medication compliance aside from the Lamictal  interruption related to the titration misunderstanding. Patient reports good sleep and she describes continued follow through with improving sleep hygiene. Patient reports chronic passive suicidal ideation that is improved from prior, and she describes no plan or intent while stating her family is a reason for living and that she would never do anything to hurt herself. Patient reports denying homicidal ideation, and she describes ongoing auditory and visual hallucinations that are unchanged from prior. Patient reports denying recent alcohol, tobacco, or cannabis use.  Visit Diagnosis:    ICD-10-CM   1. Schizoaffective disorder, bipolar type (HCC)  F25.0 lamoTRIgine  (LAMICTAL ) 25 MG tablet    lamoTRIgine  (LAMICTAL ) 25 MG tablet    lamoTRIgine  (LAMICTAL ) 100 MG tablet    ARIPiprazole  (ABILIFY ) 10 MG tablet    lurasidone  (LATUDA ) 40 MG TABS tablet    lurasidone  (LATUDA ) 80 MG TABS tablet    DISCONTINUED: ARIPiprazole  (ABILIFY ) 20 MG tablet    2. GAD (generalized anxiety disorder)  F41.1 propranolol  (INDERAL ) 20 MG tablet    3. Insomnia, unspecified type  G47.00 mirtazapine  (REMERON ) 7.5 MG tablet    4. Tardive dyskinesia  G24.01 propranolol  (INDERAL ) 20 MG tablet    INGREZZA  60 MG capsule    5. Long term current use of antipsychotic medication  Z79.899 INGREZZA  60 MG capsule       Past Psychiatric History:  Diagnoses: Schizoaffective disorder-bipolar type  Medication trials: Seroquel , Prozac , Olanzapine , loxapine  (dizziness, heaviness while walking) Previous psychiatrist/therapist: Dr. Brutus Hospitalizations: Denies Suicide attempts: denies SIB:  Denies Hx of violence towards others: Denies Current access to guns: Denies Hx of trauma/abuse: Denies Substance use: Denies  Social History   Socioeconomic History   Marital status: Divorced    Spouse name: Not on file   Number of children: Not on file   Years of education: Not on file   Highest education level: Not on file  Occupational History   Not on file  Tobacco Use   Smoking status: Never   Smokeless tobacco: Never  Vaping Use   Vaping status: Never Used  Substance and Sexual Activity   Alcohol use: No   Drug use: No   Sexual activity: Not on file  Other Topics Concern   Not on file  Social History Narrative   Not on file   Social Drivers of Health   Tobacco Use: Low Risk (11/08/2024)   Patient History    Smoking Tobacco Use: Never    Smokeless Tobacco Use: Never    Passive Exposure: Not on file  Financial Resource Strain: Not on file  Food Insecurity: Not on file  Transportation Needs: Not on file  Physical Activity: Not on file  Stress: Not on file  Social Connections: Not on file  Depression (PHQ2-9): High Risk (06/09/2023)   Depression (PHQ2-9)    PHQ-2 Score: 18  Alcohol Screen: Not on file  Housing: Unknown (12/27/2023)   Received from [facility]   Epic    Unable to Pay for Housing in the Last Year: Not  on file    Number of Times Moved in the Last Year: Not on file    At any time in the past 12 months, were you homeless or living in a shelter (including now)?: No  Utilities: Not on file  Health Literacy: Not on file    Allergies: No Known Allergies  Current Medications: Current Outpatient Medications  Medication Sig Dispense Refill   ARIPiprazole  (ABILIFY ) 10 MG tablet Take 1 tablet (10 mg total) by mouth daily. Take abilify  10 mg daily for 7 days then STOP 7 tablet 0   lamoTRIgine  (LAMICTAL ) 100 MG tablet Take 1 tablet (100 mg total) by mouth daily. 30 tablet 2   lamoTRIgine  (LAMICTAL ) 25 MG tablet Take 1 tablet (25 mg  total) by mouth daily. Take 25 mg (1 tablet) daily for 2 weeks 14 tablet 0   lamoTRIgine  (LAMICTAL ) 25 MG tablet Take 2 tablets (50 mg total) by mouth daily. Take 50 mg (2 tablets) daily for 2 weeks 28 tablet 0   lurasidone  (LATUDA ) 40 MG TABS tablet Take 1 tablet (40 mg total) by mouth daily with breakfast. Take Latuda  40 mg daily with meals for 1 week, then increase to 80 mg daily 7 tablet 0   lurasidone  (LATUDA ) 80 MG TABS tablet Take 1 tablet (80 mg total) by mouth daily with breakfast. 60 tablet 0   atorvastatin  (LIPITOR) 40 MG tablet TAKE 1 TABLET BY MOUTH EVERY DAY 90 tablet 1   Continuous Glucose Sensor (DEXCOM G7 SENSOR) MISC Apply new sensor every 10 days for Type 2 Diabetes E11.65 3 each 11   fluticasone  (FLONASE ) 50 MCG/ACT nasal spray INHALE 1 SPRAY IN BOTH NOSTRILS IN THE MORNING AND AT BEDTIME 48 mL 1   glucose blood test strip Patient can check blood sugar up to three times a day. 100 each 12   INGREZZA  60 MG capsule Take 1 capsule (60 mg total) by mouth daily. 90 capsule 0   insulin  glargine (LANTUS ) 100 UNIT/ML injection Inject 70 Units into the skin daily.     lamoTRIgine  (LAMICTAL ) 100 MG tablet Take 1 tablet (100 mg total) by mouth daily. 60 tablet 0   lamoTRIgine  (LAMICTAL ) 25 MG tablet Take 1 tablet (25 mg total) by mouth daily. Start taking lamotrigine  25 mg (1 tablet) for 14 days, then increase to 50 mg (2 tablets) for 14 days 42 tablet 0   Lancets (FREESTYLE) lancets Use as instructed 100 each 12   metFORMIN  (GLUCOPHAGE -XR) 500 MG 24 hr tablet Take 2 tablets (1,000 mg total) by mouth 2 (two) times daily with a meal. 360 tablet 3   mirtazapine  (REMERON ) 7.5 MG tablet Take 1 tablet (7.5 mg total) by mouth at bedtime. 90 tablet 0   propranolol  (INDERAL ) 20 MG tablet Take 1 tablet (20 mg total) by mouth 2 (two) times daily. 60 tablet 2   Semaglutide , 1 MG/DOSE, (OZEMPIC , 1 MG/DOSE,) 4 MG/3ML SOPN Inject 1 mg into the skin once a week. 3 mL 5   No current facility-administered  medications for this visit.    ROS: Review of Systems  All other systems reviewed and are negative.   Objective:  Objective: Psychiatric Specialty Exam: General Appearance: Casual, fairly groomed  Eye Contact:  Fleeting  Speech:  Clear, coherent, normal rate, spontaneous  Volume:  Normal   Mood:  Fine  Affect: Euthymic; congruent with stated mood, full range  Thought Content: Logical, chronic AVH unchanged from last visit  Suicidal Thoughts: see subjective  Thought Process: linear, organized  Orientation:  A&Ox4   Memory:  Immediate good  Judgment:  Improved, good  Insight:  Fair  Concentration:  Attention and concentration good   Recall:  unable to formally assess  Fund of Knowledge:  unable to formally assess  Language: Good, fluent  Psychomotor Activity: normal, limited by virtual visit  Akathisia:  NA   AIMS (if indicated): NA   Assets:   Communication Skills Social Support  ADL's:  Intact  Cognition: WNL  Sleep: see above  Appetite: see above    Physical Exam Vitals reviewed.  Constitutional:      General: She is not in acute distress.    Appearance: She is not ill-appearing.  HENT:     Head: Normocephalic and atraumatic.  Eyes:     Extraocular Movements: Extraocular movements intact.     Conjunctiva/sclera: Conjunctivae normal.  Pulmonary:     Effort: Pulmonary effort is normal. No respiratory distress.  Musculoskeletal:        General: Normal range of motion.  Skin:    General: Skin is warm and dry.  Neurological:     General: No focal deficit present.      Metabolic Disorder Labs: Lab Results  Component Value Date   HGBA1C 6.4 (H) 08/16/2024   MPG >398 10/27/2022   No results found for: PROLACTIN Lab Results  Component Value Date   CHOL 195 04/21/2024   TRIG 63 04/21/2024   HDL 75 04/21/2024   CHOLHDL 2.6 04/21/2024   LDLCALC 108 (H) 04/21/2024   LDLCALC 142 (H) 10/08/2023   Lab Results  Component Value Date   TSH 3.590  10/08/2023    Therapeutic Level Labs: No results found for: LITHIUM No results found for: VALPROATE No results found for: CBMZ  Screenings:  AIMS    Flowsheet Row Office Visit [facility]/08/20[facility] in [facility]  AIMS Total Score 0   PHQ2-9    Flowsheet Row Office Visit from 06/09/2023 in [facility] Video Visit from 03/13/2023 in [facility] Video Visit from 01[facility]16/2023 in [facility] Video Visit from 09[facility]12/2022 in [facility] Video Visit from 05[facility]30/2022 in [facility]  PHQ-2 Total Score 4 2 2 2 2   PHQ-9 Total Score 18 5 12 8 5    Flowsheet Row Office Visit from 06/09/2023 in [facility] Video Visit from 03/13/2023 in [facility] Video Visit from 01/16/2023 in [facility]  C-SSRS RISK CATEGORY High Risk Error: Q3, 4, or 5 should not be populated when Q2 is No Error: Q3, 4, or 5 should not be populated when Q2 is No    Collaboration of Care: Collaboration of Care  Patient/Guardian was advised Release of Information must be obtained prior to any record release in order to collaborate their care with an outside provider. Patient/Guardian was advised if they have not already done so to contact the registration department to sign all necessary forms in order for us  to release information regarding their care.   Consent: Patient/Guardian gives verbal consent for treatment and assignment of benefits for services provided during this visit. Patient/Guardian expressed understanding and agreed to proceed.    Marlo Masson, MD 12/20/2024, 5:24 PM

## 2024-12-23 ENCOUNTER — Other Ambulatory Visit (HOSPITAL_COMMUNITY): Payer: Self-pay | Admitting: Student in an Organized Health Care Education/Training Program

## 2024-12-23 DIAGNOSIS — F250 Schizoaffective disorder, bipolar type: Secondary | ICD-10-CM

## 2025-01-24 ENCOUNTER — Telehealth (HOSPITAL_COMMUNITY): Admitting: Student in an Organized Health Care Education/Training Program

## 2025-02-08 ENCOUNTER — Ambulatory Visit: Admitting: Internal Medicine
